# Patient Record
Sex: Female | Born: 1970
Health system: Southern US, Community
[De-identification: ages and names within clinical notes are randomized; demographics above are authoritative.]

## PROBLEM LIST (undated history)

## (undated) DIAGNOSIS — E119 Type 2 diabetes mellitus without complications: Secondary | ICD-10-CM

## (undated) DIAGNOSIS — E785 Hyperlipidemia, unspecified: Secondary | ICD-10-CM

## (undated) DIAGNOSIS — K219 Gastro-esophageal reflux disease without esophagitis: Secondary | ICD-10-CM

## (undated) DIAGNOSIS — Z8619 Personal history of other infectious and parasitic diseases: Secondary | ICD-10-CM

## (undated) DIAGNOSIS — IMO0001 Reserved for inherently not codable concepts without codable children: Secondary | ICD-10-CM

## (undated) DIAGNOSIS — R609 Edema, unspecified: Secondary | ICD-10-CM

## (undated) DIAGNOSIS — B009 Herpesviral infection, unspecified: Secondary | ICD-10-CM

## (undated) DIAGNOSIS — R011 Cardiac murmur, unspecified: Secondary | ICD-10-CM

## (undated) DIAGNOSIS — I10 Essential (primary) hypertension: Secondary | ICD-10-CM

## (undated) DIAGNOSIS — F419 Anxiety disorder, unspecified: Secondary | ICD-10-CM

## (undated) DIAGNOSIS — R0602 Shortness of breath: Secondary | ICD-10-CM

## (undated) DIAGNOSIS — M5430 Sciatica, unspecified side: Secondary | ICD-10-CM

## (undated) DIAGNOSIS — N83209 Unspecified ovarian cyst, unspecified side: Secondary | ICD-10-CM

## (undated) HISTORY — DX: Gastro-esophageal reflux disease without esophagitis: K21.9

## (undated) HISTORY — DX: Herpesviral infection, unspecified: B00.9

## (undated) HISTORY — DX: Anxiety disorder, unspecified: F41.9

## (undated) HISTORY — PX: DILATION AND CURETTAGE OF UTERUS: SHX78

## (undated) HISTORY — DX: Unspecified ovarian cyst, unspecified side: N83.209

## (undated) HISTORY — DX: Personal history of other infectious and parasitic diseases: Z86.19

## (undated) HISTORY — DX: Shortness of breath: R06.02

## (undated) HISTORY — DX: Sciatica, unspecified side: M54.30

## (undated) HISTORY — PX: ENDOMETRIAL ABLATION: SHX621

## (undated) HISTORY — DX: Reserved for inherently not codable concepts without codable children: IMO0001

---

## 2002-03-09 ENCOUNTER — Ambulatory Visit (HOSPITAL_COMMUNITY): Admission: RE | Admit: 2002-03-09 | Discharge: 2002-03-09 | Payer: Self-pay | Admitting: Internal Medicine

## 2004-05-28 ENCOUNTER — Emergency Department (HOSPITAL_COMMUNITY): Admission: EM | Admit: 2004-05-28 | Discharge: 2004-05-28 | Payer: Self-pay | Admitting: Emergency Medicine

## 2004-08-16 HISTORY — PX: TUBAL LIGATION: SHX77

## 2005-06-10 ENCOUNTER — Encounter: Payer: Self-pay | Admitting: Obstetrics and Gynecology

## 2005-06-10 ENCOUNTER — Inpatient Hospital Stay (HOSPITAL_COMMUNITY): Admission: RE | Admit: 2005-06-10 | Discharge: 2005-06-12 | Payer: Self-pay | Admitting: Obstetrics and Gynecology

## 2005-09-25 ENCOUNTER — Emergency Department (HOSPITAL_COMMUNITY): Admission: EM | Admit: 2005-09-25 | Discharge: 2005-09-25 | Payer: Self-pay | Admitting: Emergency Medicine

## 2007-05-12 ENCOUNTER — Other Ambulatory Visit: Admission: RE | Admit: 2007-05-12 | Discharge: 2007-05-12 | Payer: Self-pay | Admitting: Obstetrics and Gynecology

## 2008-01-25 ENCOUNTER — Ambulatory Visit (HOSPITAL_COMMUNITY): Admission: RE | Admit: 2008-01-25 | Discharge: 2008-01-25 | Payer: Self-pay | Admitting: Internal Medicine

## 2008-09-26 ENCOUNTER — Other Ambulatory Visit: Admission: RE | Admit: 2008-09-26 | Discharge: 2008-09-26 | Payer: Self-pay | Admitting: Obstetrics and Gynecology

## 2008-10-03 ENCOUNTER — Ambulatory Visit (HOSPITAL_COMMUNITY): Admission: RE | Admit: 2008-10-03 | Discharge: 2008-10-03 | Payer: Self-pay | Admitting: Obstetrics & Gynecology

## 2009-12-11 ENCOUNTER — Emergency Department (HOSPITAL_COMMUNITY): Admission: EM | Admit: 2009-12-11 | Discharge: 2009-12-12 | Payer: Self-pay | Admitting: Emergency Medicine

## 2009-12-16 ENCOUNTER — Observation Stay (HOSPITAL_COMMUNITY)
Admission: EM | Admit: 2009-12-16 | Discharge: 2009-12-17 | Payer: Self-pay | Source: Home / Self Care | Admitting: Emergency Medicine

## 2010-08-04 ENCOUNTER — Ambulatory Visit (HOSPITAL_COMMUNITY)
Admission: RE | Admit: 2010-08-04 | Discharge: 2010-08-04 | Payer: Self-pay | Source: Home / Self Care | Attending: Internal Medicine | Admitting: Internal Medicine

## 2010-08-27 ENCOUNTER — Encounter (HOSPITAL_COMMUNITY)
Admission: RE | Admit: 2010-08-27 | Discharge: 2010-09-15 | Payer: Self-pay | Source: Home / Self Care | Attending: Internal Medicine | Admitting: Internal Medicine

## 2010-09-21 ENCOUNTER — Institutional Professional Consult (permissible substitution) (INDEPENDENT_AMBULATORY_CARE_PROVIDER_SITE_OTHER): Payer: BC Managed Care – PPO | Admitting: Internal Medicine

## 2010-09-21 DIAGNOSIS — K279 Peptic ulcer, site unspecified, unspecified as acute or chronic, without hemorrhage or perforation: Secondary | ICD-10-CM

## 2010-09-21 DIAGNOSIS — A048 Other specified bacterial intestinal infections: Secondary | ICD-10-CM

## 2010-10-25 NOTE — Consult Note (Signed)
Susan [name], Susan [name]                ACCOUNT NO.:  [id number]  MEDICAL RECORD NO.:  [id number]           PATIENT TYPE: AMB  LOCATION: [place]                    [facility]  PHYSICIAN:  Dorene Ar, NP       DATE OF BIRTH:  Jan 04, 1971  DATE OF CONSULTATION: DATE OF DISCHARGE:                                CONSULTATION   REASON FOR CONSULTATION:  Epigastric pain.  HISTORY OF PRESENT ILLNESS:  Ms. Gerhart is a 40 year old female referred to our office by Dr. Sherwood Gambler for epigastric pain.  She was seen originally by Dr. Sherwood Gambler in December for epigastric pain.  She underwent a HIDA scan, which revealed a normal exam.  The gallbladder ejection fraction was 91%.  She also underwent an ultrasound of the abdomen for right upper quadrant pain also with a negative abdominal ultrasound.  She was, however, H. pylori positive in December.  She did receive an antibiotic treatment.  She states she did not have any epigastric pain in January. Her symptoms have returned for approximately 2 weeks now.  She does state that every time she eats, her stomach will knot up.  She complains of daily acid reflux.  She does have frequent nausea and frequent belching.  She states her appetite is good.  She has had no weight loss. She is having a bowel movement a day.  They are dark brown in color, normal size.  She is taking Motrin 200 mg 4 a day and Tylenol for her lower back pain.  August 31, 2010, hemoglobin 12.6 and hematocrit 38.7, ESR 8, platelets 253,000.  Sodium 137, potassium 4.6, chloride 101, CO2 28, BUN 14, creatinine 0.96, random glucose 88, calcium 9.4.  Her WBC count was 9.4.  ALLERGIES:  There are no known allergies.  HOME MEDICATIONS: 1. Celexa 20 mg 1 a day. 2. Xanax 0.25 p.r.n. 3. Valacyclovir 1 gram a day. 4. Tylenol as needed. 5. Motrin up to 4 a day, 200 mg.  SURGERIES:  She has had 2 C-sections in the past.  PREVIOUS MEDICAL HISTORY:  Genital herpes, anxiety, and back pain  from a work injury.  FAMILY HISTORY:  Her mother is alive with hypertension.  Her father is alive with diabetes, hypertension, and high cholesterol.  She has 2 brothers, 1 brother has hypertension, high cholesterol, and one is in good health.  She is married.  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  She does not smoke or do drugs.  She occasionally drinks alcohol and she has 2 children in good health.  OBJECTIVE:  VITALS:  Her weight is 223.4, her height is 4 feet 11 inches, her BMI is 45, her temperature is 99.1, her blood pressure is 116/80, her pulse is 72. HEENT:  She has natural teeth.  Her oral mucosa is moist.  There are no lesions.  Her teeth are in good condition.  Her conjunctivae is pink. Her sclerae is anicteric.  Her thyroid is normal.  There is no cervical lymphadenopathy. LUNGS:  Clear. HEART:  Regular rate and rhythm. ABDOMEN:  Obese, soft.  Bowel sounds are positive.  She does have some epigastric tenderness. EXTREMITIES:  There is no edema to her lower extremities.  ASSESSMENT:  Ms. Wienke is a 40 year old female with epigastric pain. Peptic ulcer disease is likely.  She has been taking an excessive amount of Motrin up to 4 a day.  She did test positive for H. pylori back in December.  She did receive treatment.  At present she is not on a PPI.  RECOMMENDATIONS:  We will start omeprazole 40 mg 1 a day, 30 minutes before breakfast.  She is to take no more than 2 Motrin a day.  She will call with a progress report in 2 weeks.  If she is not better in 2 weeks, recommend an EGD with Dr. Karilyn Cota          ______________________________ Dorene Ar, NP     TS/MEDQ  D:  09/21/2010  T:  09/22/2010  Job:  [id number]  cc:   Madelin Rear. Sherwood Gambler, MD Fax: [phone]  Electronically Signed by Dorene Ar PA on 09/22/2010 08:55:23 AM Electronically Signed by Lionel December M.D. on 10/25/2010 12:59:26 PM

## 2010-11-03 LAB — CBC
HCT: 35 % — ABNORMAL LOW (ref 36.0–46.0)
HCT: 38.2 % (ref 36.0–46.0)
Hemoglobin: 12 g/dL (ref 12.0–15.0)
Hemoglobin: 13.1 g/dL (ref 12.0–15.0)
MCHC: 34.2 g/dL (ref 30.0–36.0)
MCHC: 34.3 g/dL (ref 30.0–36.0)
MCV: 88.1 fL (ref 78.0–100.0)
MCV: 88.6 fL (ref 78.0–100.0)
Platelets: 182 10*3/uL (ref 150–400)
Platelets: 204 10*3/uL (ref 150–400)
RBC: 3.98 MIL/uL (ref 3.87–5.11)
RBC: 4.32 MIL/uL (ref 3.87–5.11)
RDW: 15.6 % — ABNORMAL HIGH (ref 11.5–15.5)
RDW: 15.6 % — ABNORMAL HIGH (ref 11.5–15.5)
WBC: 8.2 10*3/uL (ref 4.0–10.5)
WBC: 8.2 10*3/uL (ref 4.0–10.5)

## 2010-11-03 LAB — POCT I-STAT, CHEM 8
BUN: 11 mg/dL (ref 6–23)
Calcium, Ion: 1.09 mmol/L — ABNORMAL LOW (ref 1.12–1.32)
Chloride: 104 mEq/L (ref 96–112)
Creatinine, Ser: 1.1 mg/dL (ref 0.4–1.2)
Glucose, Bld: 119 mg/dL — ABNORMAL HIGH (ref 70–99)
HCT: 41 % (ref 36.0–46.0)
Hemoglobin: 13.9 g/dL (ref 12.0–15.0)
Potassium: 3.4 mEq/L — ABNORMAL LOW (ref 3.5–5.1)
Sodium: 138 mEq/L (ref 135–145)
TCO2: 24 mmol/L (ref 0–100)

## 2010-11-03 LAB — POCT CARDIAC MARKERS
CKMB, poc: <1 ng/mL — ABNORMAL LOW (ref 1.0–8.0)
CKMB, poc: <1 ng/mL — ABNORMAL LOW (ref 1.0–8.0)
CKMB, poc: <1 ng/mL — ABNORMAL LOW (ref 1.0–8.0)
Myoglobin, poc: 44 ng/mL (ref 12–200)
Myoglobin, poc: 69.4 ng/mL (ref 12–200)
Myoglobin, poc: 52.9 ng/mL (ref 12–200)
Troponin i, poc: <0.05 ng/mL (ref 0.00–0.09)
Troponin i, poc: <0.05 ng/mL (ref 0.00–0.09)
Troponin i, poc: <0.05 ng/mL (ref 0.00–0.09)

## 2010-11-03 LAB — BASIC METABOLIC PANEL
BUN: 10 mg/dL (ref 6–23)
BUN: 12 mg/dL (ref 6–23)
CO2: 23 mEq/L (ref 19–32)
CO2: 25 mEq/L (ref 19–32)
Calcium: 8.7 mg/dL (ref 8.4–10.5)
Calcium: 8.8 mg/dL (ref 8.4–10.5)
Chloride: 104 mEq/L (ref 96–112)
Chloride: 106 mEq/L (ref 96–112)
Creatinine, Ser: 0.82 mg/dL (ref 0.4–1.2)
Creatinine, Ser: 0.91 mg/dL (ref 0.4–1.2)
GFR calc Af Amer: >60 mL/min (ref >60)
GFR calc Af Amer: >60 mL/min (ref >60)
GFR calc non Af Amer: >60 mL/min (ref >60)
GFR calc non Af Amer: >60 mL/min (ref >60)
Glucose, Bld: 117 mg/dL — ABNORMAL HIGH (ref 70–99)
Glucose, Bld: 99 mg/dL (ref 70–99)
Potassium: 3.5 mEq/L (ref 3.5–5.1)
Potassium: 3.9 mEq/L (ref 3.5–5.1)
Sodium: 136 mEq/L (ref 135–145)
Sodium: 136 mEq/L (ref 135–145)

## 2010-11-03 LAB — CK TOTAL AND CKMB (NOT AT ARMC)
CK, MB: 0.8 ng/mL (ref 0.3–4.0)
CK, MB: 1 ng/mL (ref 0.3–4.0)
Relative Index: 0.7 (ref 0.0–2.5)
Relative Index: 0.9 (ref 0.0–2.5)
Total CK: 112 U/L (ref 7–177)
Total CK: 115 U/L (ref 7–177)

## 2010-11-03 LAB — DIFFERENTIAL
Basophils Absolute: 0 10*3/uL (ref 0.0–0.1)
Basophils Absolute: 0.1 10*3/uL (ref 0.0–0.1)
Basophils Relative: 0 % (ref 0–1)
Basophils Relative: 1 % (ref 0–1)
Eosinophils Absolute: 0.3 10*3/uL (ref 0.0–0.7)
Eosinophils Absolute: 0.3 10*3/uL (ref 0.0–0.7)
Eosinophils Relative: 3 % (ref 0–5)
Eosinophils Relative: 4 % (ref 0–5)
Lymphocytes Relative: 36 % (ref 12–46)
Lymphocytes Relative: 46 % (ref 12–46)
Lymphs Abs: 3 10*3/uL (ref 0.7–4.0)
Lymphs Abs: 3.8 10*3/uL (ref 0.7–4.0)
Monocytes Absolute: 0.4 10*3/uL (ref 0.1–1.0)
Monocytes Absolute: 0.6 10*3/uL (ref 0.1–1.0)
Monocytes Relative: 5 % (ref 3–12)
Monocytes Relative: 8 % (ref 3–12)
Neutro Abs: 3.5 10*3/uL (ref 1.7–7.7)
Neutro Abs: 4.5 10*3/uL (ref 1.7–7.7)
Neutrophils Relative %: 42 % — ABNORMAL LOW (ref 43–77)
Neutrophils Relative %: 55 % (ref 43–77)

## 2010-11-03 LAB — D-DIMER, QUANTITATIVE: D-Dimer, Quant: 0.28 ug/mL-FEU (ref 0.00–0.48)

## 2010-11-03 LAB — TROPONIN I
Troponin I: <0.01 ng/mL (ref 0.00–0.06)
Troponin I: <0.01 ng/mL (ref 0.00–0.06)

## 2010-11-03 LAB — PROTIME-INR
INR: 0.91 (ref 0.00–1.49)
Prothrombin Time: 12.5 seconds (ref 11.6–15.2)

## 2010-11-03 LAB — APTT: aPTT: 25 seconds (ref 24–37)

## 2010-12-09 ENCOUNTER — Other Ambulatory Visit: Payer: Self-pay | Admitting: Adult Health

## 2010-12-09 ENCOUNTER — Other Ambulatory Visit: Payer: Self-pay | Admitting: Obstetrics & Gynecology

## 2010-12-09 ENCOUNTER — Other Ambulatory Visit (HOSPITAL_COMMUNITY)
Admission: RE | Admit: 2010-12-09 | Discharge: 2010-12-09 | Disposition: A | Discharge status: Home / Self Care | Payer: BC Managed Care – PPO | Source: Ambulatory Visit | Attending: Obstetrics and Gynecology | Admitting: Obstetrics and Gynecology

## 2010-12-09 DIAGNOSIS — N920 Excessive and frequent menstruation with regular cycle: Secondary | ICD-10-CM

## 2010-12-09 DIAGNOSIS — N63 Unspecified lump in unspecified breast: Secondary | ICD-10-CM

## 2010-12-09 DIAGNOSIS — N946 Dysmenorrhea, unspecified: Secondary | ICD-10-CM

## 2010-12-09 DIAGNOSIS — Z01419 Encounter for gynecological examination (general) (routine) without abnormal findings: Secondary | ICD-10-CM | POA: Insufficient documentation

## 2010-12-14 ENCOUNTER — Ambulatory Visit (HOSPITAL_COMMUNITY)
Admission: RE | Admit: 2010-12-14 | Discharge: 2010-12-14 | Disposition: A | Discharge status: Home / Self Care | Payer: BC Managed Care – PPO | Source: Ambulatory Visit | Attending: Obstetrics & Gynecology | Admitting: Obstetrics & Gynecology

## 2010-12-14 ENCOUNTER — Other Ambulatory Visit: Payer: Self-pay | Admitting: Obstetrics & Gynecology

## 2010-12-14 DIAGNOSIS — N920 Excessive and frequent menstruation with regular cycle: Secondary | ICD-10-CM

## 2010-12-14 DIAGNOSIS — R9389 Abnormal findings on diagnostic imaging of other specified body structures: Secondary | ICD-10-CM | POA: Insufficient documentation

## 2010-12-14 DIAGNOSIS — N946 Dysmenorrhea, unspecified: Secondary | ICD-10-CM | POA: Insufficient documentation

## 2010-12-14 DIAGNOSIS — N949 Unspecified condition associated with female genital organs and menstrual cycle: Secondary | ICD-10-CM | POA: Insufficient documentation

## 2010-12-23 ENCOUNTER — Ambulatory Visit (HOSPITAL_COMMUNITY)
Admission: RE | Admit: 2010-12-23 | Discharge: 2010-12-23 | Disposition: A | Discharge status: Home / Self Care | Payer: BC Managed Care – PPO | Source: Ambulatory Visit | Attending: Obstetrics & Gynecology | Admitting: Obstetrics & Gynecology

## 2010-12-23 DIAGNOSIS — N63 Unspecified lump in unspecified breast: Secondary | ICD-10-CM | POA: Insufficient documentation

## 2010-12-28 ENCOUNTER — Encounter: Payer: Self-pay | Admitting: Adult Health

## 2011-01-01 NOTE — Discharge Summary (Signed)
Susan [name], Susan [name]                ACCOUNT NO.:  [id number]   MEDICAL RECORD NO.:  [id number]          PATIENT TYPE:  INP   LOCATION:  A413                          FACILITY:  [facility]   PHYSICIAN:  Tilda Burrow, M.D. DATE OF BIRTH:  1971/01/23   DATE OF ADMISSION:  06/10/2005  DATE OF DISCHARGE:  10/28/2006LH                                 DISCHARGE SUMMARY   ADMISSION DIAGNOSES:  1.  Pregnancy at 38-4/7 weeks.  2.  Repeat cesarean section, not for trial of labor.  3.  Desire for elective permanent sterilization.   DISCHARGE DIAGNOSES:  1.  Pregnancy at 38-4/7 weeks.  2.  Repeat cesarean section, not for trial of labor.  3.  Desire for elective permanent sterilization.   PROCEDURE:  Repeat low transverse cervical cesarean section on June 10, 2005, by Dr. Emelda Fear.   DISCHARGE MEDICATIONS:  1.  Motrin 800 mg one p.o. q.8h. p.r.n. pain.  2.  Tylox one to two q.4h. p.r.n. pain, dispense #20.  3.  Prenatal vitamins one p.o. daily.  4.  Tums and Rolaids p.r.n.   HOSPITAL COURSE:  This 40 year old female, G3, P1, AB1 due November 4, with  first and second trimester ultrasounds admitted for repeat cesarean section  as described in the H&P.   The patient had an admitting hemoglobin of 11.8, hematocrit 35.7, white  count 10,400 with blood type B positive.  She underwent repeat cesarean  section delivering healthy female infant with Apgar's 9 and 9.  She had a wide  excision of the cicatrix performed at the time of the C-section ([place]).  She  had EBL of 600 mL.  Tubal ligation was performed  at the time of C-section.  Pathology report confirms portion of both tubes  removed.  She tolerated a regular diet within 2 days.  She was discharged  home with routine followup instructions for staple removal in 1 week and  then routine postpartum visit in 4 weeks.      Tilda Burrow, M.D.  Electronically Signed     JVF/MEDQ  D:  06/28/2005  T:  06/29/2005  Job:  [id number]   cc:    Francoise Schaumann. Halford Chessman  Fax: [phone]   [facility]

## 2011-01-01 NOTE — Op Note (Signed)
[name], [name]                ACCOUNT NO.:  [id number]   MEDICAL RECORD NO.:  [id number]          PATIENT TYPE:  INP   LOCATION:  A413                          FACILITY:  [facility]   PHYSICIAN:  Tilda Burrow, M.D. DATE OF BIRTH:  Feb 06, 1971   DATE OF PROCEDURE:  06/10/2005  DATE OF DISCHARGE:                                 OPERATIVE REPORT   PREOPERATIVE DIAGNOSIS:  Pregnancy at 38 weeks 4 days, repeat Cesarean  section, not for trial of labor, desires elective permanent sterilization.   POSTOPERATIVE DIAGNOSIS:  Pregnancy at 38 weeks 4 days, repeat Cesarean  section, not for trial of labor, desires elective permanent sterilization.   PROCEDURE:  Repeat low transverse cervical Cesarean section. Bilateral  partial salpingectomy. Wide excision of cicatrix ([place]).   SURGEON:  Tilda Burrow, M.D.   ASSISTANTAlinda Money, R.N.   ANESTHESIA:  Spinal, Nelda Severe, C.R.N.A.   COMPLICATIONS:  None.   FINDINGS:  Healthy female infant, Apgars 9 and 9. Abdominal wall laxity with  skin crease from prior Cesarean section.   INDICATIONS:  A 40 year old female, gravida 3, para 1, AB 1 scheduled for  repeat Cesarean section. Specific discussion of the skin incision revision  was performed preprocedure to make sure patient was aware of the enlarged  incision resulting increased risk for bleeding, infection, etc.   DETAILS OF PROCEDURE:  The patient was taken to the operating room. Spinal  anesthesia introduced and the abdomen prepped and draped. Foley catheter was  then placed. Transverse lower abdominal incision was marked approximately  two inches above the old scar and approximately 1 inch below it, with  scoring of the skin along the future incision lines. We then entered the  upper incision, opening transversely from the anterior superior iliac  crest/anterior superior iliac crest, and developing the fascia in the  standard method of Pfannenstiel incision. The rectus muscles were split in  the midline, peritoneal cavity entered easily and without difficulty, and  peritoneal opening extended superiorly and inferiorly. Bladder flap was  developed easily on the lower uterine segment and fetal vertex identified  using a transverse nick in the lower uterine segment. This was opened  transversely using index finger traction, and then the fetal vertex guided  through the incision using vacuum extractor followed by placement of index  finger under the axilla and delivery of the fetal body. The amniotic fluid  was clear without malodor. Infant was delivered by axillary traction, and  subsequent axillary traction and subsequent clamping of the cord was  performed. See Dr. Webb Laws notes regarding baby's care.   Cord blood samples were obtained, and then the placenta delivered by Crede  uterine massage. The majority of the cord was passed off to Dr. Milford Cage with  the baby, but enough was present for the blood samples to be adequately  obtained. Placenta was intact, [name] presentation. Uterine irrigation with  antibiotic solution was followed by single layer of running locking closure  with 0 chromic. The bladder flap was inspected and found hemostatic.   Tubal ligation was then performed by grasping each mid segment  knuckle of  tube with Babcock clamp, doubly ligating around the incarcerated portion of  the tube and excising the middle portion of the tube. Hemostasis was  visually confirmed.   Bladder flap was then reapproximated using 2-0 chromic, and then the  peritoneal cavity closed with 2-0 chromic running closure of the peritoneum,  2-0 chromic reapproximation of the rectus muscles into the midline x2  interrupted sutures, then 0 Vicryl closure of the fascia. Prior to the  closure of the fascia, we reinspected the incision. It became apparent that  some additional trimming of the skin was necessary, so an additional strip  of skin and underlying fatty tissue was removed from the  cephalad portion of  the incision. We then reapproximated the fascia. A small bit of fascia had  been excised with the incision revision. The fascia was pulled together with  continuous running 0 Vicryl with excellent tissue approximation.  Subcutaneous tissue were reapproximated using interrupted 2-0 plain, subcu  flat JP drain was placed through the subcu tissues and allowed to exit  through the incision on the right side. The skin edges were reapproximated  nicely using the subcu stitches of 2-0 plain, and then staple closure of the  skin completed the procedure. A JP drain was sewn in place. Estimated blood  loss 600 cc.      Tilda Burrow, M.D.  Electronically Signed     JVF/MEDQ  D:  06/10/2005  T:  06/11/2005  Job:  [id number]   cc:   Francoise Schaumann. Halford Chessman  Fax: [phone]

## 2011-01-01 NOTE — H&P (Signed)
NAMEMARIABELLA, NILSEN                ACCOUNT NO.:  [id number]   MEDICAL RECORD NO.:  [id number]           PATIENT TYPE:   LOCATION:                                FACILITY:  [facility]   PHYSICIAN:  Tilda Burrow, M.D. DATE OF BIRTH:  July 09, 1971   DATE OF ADMISSION:  DATE OF DISCHARGE:  LH                                HISTORY & PHYSICAL   ADMISSION DIAGNOSIS:  Pregnancy, 38 weeks 4 days. Repeat Cesarean section,  not for trial of labor. Desire for elective permanent sterilization.   HISTORY OF PRESENT ILLNESS:  This 40 year old female gravida 3, para 1, AB  1, LMP September 12, 2004 placing menstrual [facility] June 19, 2005 with  corresponding first trimester and second trimester ultrasounds __with  prenatal care__ followed through our office through 12 prenatal visits.  Romilda has been scheduled for repeat Cesarean section and tubal ligation.  She understands the permanency of the request and tubal sterilization with  failure rates of 1:100 quoted to the patient. Prenatal course has been  uneventful with fundal height running slightly below normal but considered  appropriate for patient's small stature. Prior OB history of a low  transverse cervical Cesarean section for variable decelerations, unable to  deliver a 7-pound 15-ounce infant.   PHYSICAL EXAMINATION:  GENERAL:  Shows a short, large framed, African-  American female. Height 5 feet 0, weight 226 which is a 15-pound weight gain  this pregnancy.  HEENT:  Pupils are equal, round, and reactive. Extraocular movements intact.  NECK:  Supple. Trachea midline.  CHEST:  Clear to auscultation.  ABDOMEN:  34-cm fundal height. Estimated fetal weight 6 pounds. Cervix  closed, long, posterior, vertex well applied to the cervix.   PRENATAL LABORATORY DATA:  Blood type B+, rubella immunity present, urine  drug screen negative. Hemoglobin 13, hematocrit 38. Hepatitis, HIV, GC and  chlamydia all negative. HSV-2 noted on antibody testing. MSAFP  is normal.  Hemoglobin 11, hematocrit 33. Glucose tolerance test borderline at one hour  with normal three-hour test. There was no glucosuria noted during routine  prenatal visit.   The patient plans to bottle feed. Will take the baby to Dr. Milford Cage. Plan  circumcision as well as tubal ligation.   PLAN:  Repeat Cesarean section, tubal ligation, [facility] 8:30 a.m. June 10, 2005.      Tilda Burrow, M.D.  Electronically Signed     JVF/MEDQ  D:  05/28/2005  T:  05/28/2005  Job:  [id number]   cc:   Francoise Schaumann. Halford Chessman  Fax: [phone]

## 2011-01-01 NOTE — H&P (Signed)
Susan [name], Susan [name]                ACCOUNT NO.:  [id number]   MEDICAL RECORD NO.:  [id number]          PATIENT TYPE:  AMB   LOCATION:  DAY                           FACILITY:  [facility]   PHYSICIAN:  Tilda Burrow, M.D. DATE OF BIRTH:  10-29-1970   DATE OF ADMISSION:  DATE OF DISCHARGE:  LH                                HISTORY & PHYSICAL   ADMISSION DIAGNOSES:  1.  Pregnancy, 38 + [redacted] weeks gestation.  2.  Repeat cesarean section, not for trial of labor.  3.  Desires elective permanent sterilization.   HISTORY OF PRESENT ILLNESS:  This 40 year old female, gravida 3, para 1, AB  1, last menstrual period September 12, 2004, placing menstrual [facility] June 19, 2005 with corresponding first and second trimester ultrasound.  Is admitted  after pregnancy course followed through our office through 12 prenatal  visits with appropriate weight gain and fundal height growth.  She has been  seen in our office and scheduled for cesarean section.  Tubal ligation has  been discussed, reviewed and specifically requested by the patient.  Technical failure, rate of 1%, has been quoted to the patient.  Prenatal  course has been notable for blood type B positive.  Urine drug screen  negative, hemoglobin 13, hematocrit 38, hepatitis, HIV, GC and Chlamydia all  negative.  __msafp__ normal at 1/560.  Glucose tolerance test abnormal at  one hour at 175 mg/percent with normal three-hour test.  She experienced no  glucosuria throughout the pregnancy.  Fundal heights were appropriate.  No  measurements were excessive.  She plans to bottle feed.  Baby will be cared  for by Dr. Milford Cage of [facility].   PAST MEDICAL HISTORY:  Benign.   PAST SURGICAL HISTORY:  1.  Dilatation and curettage.  2.  Cesarean section performed in 1997 for uncertain fetal status,      delivering a 7 pound 15 ounce female, low transverse incision      documented.   ALLERGIES:  None.   SOCIAL HISTORY:  Married.  Works at  [employer] as a Lawyer.   PHYSICAL EXAMINATION:  GENERAL:  Healthy-appearing female, alert and  oriented x3.  VITAL SIGNS:  Height 5 feet, weight 227, blood pressure 110/80.  _Abdominal__ exam term-size fetus.  Vertex  presentation.  Estimated fetal weight 7 to 7-1/2 pounds.  CERVIX:  Closed, long and high at last office check.   PLAN:  Repeat cesarean section, tubal ligation June 10, 2005.      Tilda Burrow, M.D.  Electronically Signed     JVF/MEDQ  D:  06/09/2005  T:  06/09/2005  Job:  [id number]   cc:   Francoise Schaumann. Halford Chessman  Fax: [phone]   [facility]  Fax: [phone]

## 2011-02-03 ENCOUNTER — Encounter (HOSPITAL_COMMUNITY)
Admission: RE | Admit: 2011-02-03 | Discharge: 2011-02-03 | Disposition: A | Discharge status: Home / Self Care | Payer: BC Managed Care – PPO | Source: Ambulatory Visit | Attending: Obstetrics & Gynecology | Admitting: Obstetrics & Gynecology

## 2011-02-03 LAB — SURGICAL PCR SCREEN
MRSA, PCR: NEGATIVE
Staphylococcus aureus: NEGATIVE

## 2011-02-03 LAB — COMPREHENSIVE METABOLIC PANEL
BUN: 12 mg/dL (ref 6–23)
CO2: 28 mEq/L (ref 19–32)
Calcium: 9.3 mg/dL (ref 8.4–10.5)
Creatinine, Ser: 0.88 mg/dL (ref 0.50–1.10)
GFR calc Af Amer: >60 mL/min (ref >60)
GFR calc non Af Amer: >60 mL/min (ref >60)
Glucose, Bld: 100 mg/dL — ABNORMAL HIGH (ref 70–99)
Total Bilirubin: 0.2 mg/dL — ABNORMAL LOW (ref 0.3–1.2)

## 2011-02-03 LAB — CBC
HCT: 37.7 % (ref 36.0–46.0)
Hemoglobin: 12.5 g/dL (ref 12.0–15.0)
MCH: 28.5 pg (ref 26.0–34.0)
MCV: 85.9 fL (ref 78.0–100.0)
RBC: 4.39 MIL/uL (ref 3.87–5.11)

## 2011-02-10 ENCOUNTER — Ambulatory Visit (HOSPITAL_COMMUNITY)
Admission: RE | Admit: 2011-02-10 | Discharge: 2011-02-10 | Disposition: A | Discharge status: Home / Self Care | Payer: BC Managed Care – PPO | Source: Ambulatory Visit | Attending: Obstetrics & Gynecology | Admitting: Obstetrics & Gynecology

## 2011-02-10 DIAGNOSIS — N920 Excessive and frequent menstruation with regular cycle: Secondary | ICD-10-CM | POA: Insufficient documentation

## 2011-02-10 DIAGNOSIS — N946 Dysmenorrhea, unspecified: Secondary | ICD-10-CM | POA: Insufficient documentation

## 2011-02-10 DIAGNOSIS — Z01812 Encounter for preprocedural laboratory examination: Secondary | ICD-10-CM | POA: Insufficient documentation

## 2011-02-16 NOTE — Op Note (Signed)
  Susan [name], Susan [name]                ACCOUNT NO.:  [id number]  MEDICAL RECORD NO.:  [id number]  LOCATION:  [facility]                          FACILITY:  [facility]  PHYSICIAN:  Lazaro Arms, M.D.   DATE OF BIRTH:  23-Apr-1971  DATE OF PROCEDURE:  02/10/2011 DATE OF DISCHARGE:                              OPERATIVE REPORT   PREOPERATIVE DIAGNOSES: 1. Menometrorrhagia. 2. Dysmenorrhea.  POSTOPERATIVE DIAGNOSES: 1. Menometrorrhagia. 2. Dysmenorrhea.  PROCEDURE:  Hysteroscopy, D and C, endometrial ablation.  SURGEON:  Lazaro Arms, MD  ANESTHESIA:  General endotracheal.  FINDINGS:  The patient had a normal ultrasound in the office, normal endometrium and today hysteroscopy.  She had no polyps, fibroids, or other endometrial abnormalities.  DESCRIPTION OF OPERATION:  The patient was taken to the operating room, placed in the supine position where she underwent general endotracheal anesthesia, placed in dorsal lithotomy position, prepped and draped in usual sterile fashion.  A Graves speculum was placed.  Cervix was grasped.  With single-tooth tenaculum, the cervix was dilated serially to allow passage of the hysteroscope.  Diagnostic hysteroscopy was performed and uterine curettage was performed with good uterine cry in all areas.  ThermaChoice III endometrial ablation balloon was used.  22 mL of D5W was required to maintain a pressure between 190 and 200 mmHg throughout the procedure.  It was heated to 87 degrees Celsius.  Total therapy time was 10 minutes and 41 seconds.  All of the fluids returned at the end of the procedure and all of the equipment worked properly throughout the procedure.  The patient was awakened from anesthesia, taken to recovery room in good and stable condition.  All counts correct.  She received a gram of Ancef and Toradol preoperatively prophylactically.     Lazaro Arms, M.D.     Loraine Maple  D:  02/10/2011  T:  02/11/2011  Job:   [id number]  Electronically Signed by Duane Lope M.D. on 02/16/2011 10:30:19 AM

## 2011-04-21 ENCOUNTER — Other Ambulatory Visit (INDEPENDENT_AMBULATORY_CARE_PROVIDER_SITE_OTHER): Payer: Self-pay | Admitting: Internal Medicine

## 2011-07-05 ENCOUNTER — Other Ambulatory Visit (INDEPENDENT_AMBULATORY_CARE_PROVIDER_SITE_OTHER): Payer: Self-pay | Admitting: Internal Medicine

## 2011-08-19 ENCOUNTER — Other Ambulatory Visit (INDEPENDENT_AMBULATORY_CARE_PROVIDER_SITE_OTHER): Payer: Self-pay | Admitting: Internal Medicine

## 2011-08-19 DIAGNOSIS — K219 Gastro-esophageal reflux disease without esophagitis: Secondary | ICD-10-CM

## 2012-04-14 ENCOUNTER — Other Ambulatory Visit (INDEPENDENT_AMBULATORY_CARE_PROVIDER_SITE_OTHER): Payer: Self-pay | Admitting: Internal Medicine

## 2012-05-18 ENCOUNTER — Other Ambulatory Visit: Payer: Self-pay | Admitting: Obstetrics & Gynecology

## 2012-05-18 ENCOUNTER — Other Ambulatory Visit (HOSPITAL_COMMUNITY)
Admission: RE | Admit: 2012-05-18 | Discharge: 2012-05-18 | Disposition: A | Discharge status: Home / Self Care | Payer: BC Managed Care – PPO | Source: Ambulatory Visit | Attending: Obstetrics & Gynecology | Admitting: Obstetrics & Gynecology

## 2012-05-18 DIAGNOSIS — Z01419 Encounter for gynecological examination (general) (routine) without abnormal findings: Secondary | ICD-10-CM | POA: Insufficient documentation

## 2012-09-21 ENCOUNTER — Ambulatory Visit (INDEPENDENT_AMBULATORY_CARE_PROVIDER_SITE_OTHER): Payer: BC Managed Care – PPO | Admitting: Internal Medicine

## 2012-09-21 ENCOUNTER — Encounter (INDEPENDENT_AMBULATORY_CARE_PROVIDER_SITE_OTHER): Payer: Self-pay | Admitting: Internal Medicine

## 2012-09-21 VITALS — BP 130/76 | HR 72 | Temp 99.2°F | Ht 59.0 in | Wt 249.4 lb

## 2012-09-21 DIAGNOSIS — A6000 Herpesviral infection of urogenital system, unspecified: Secondary | ICD-10-CM

## 2012-09-21 DIAGNOSIS — K219 Gastro-esophageal reflux disease without esophagitis: Secondary | ICD-10-CM

## 2012-09-21 DIAGNOSIS — R109 Unspecified abdominal pain: Secondary | ICD-10-CM | POA: Insufficient documentation

## 2012-09-21 DIAGNOSIS — R1906 Epigastric swelling, mass or lump: Secondary | ICD-10-CM

## 2012-09-21 DIAGNOSIS — R10A0 Flank pain, unspecified side: Secondary | ICD-10-CM

## 2012-09-21 NOTE — Patient Instructions (Addendum)
Cmet, US abdomen

## 2012-09-21 NOTE — Progress Notes (Signed)
Subjective:     Patient ID: Susan [name], female   DOB: [birth year], 42 y.o.   MRN: [id number]  HPIPresents today with c/o that she has a constant grumbling in her stomach x 1-2 months. She also tells me her stomach is hard. Occasionally has pressure in her left flank. She tells me her acid reflux is controlled with omeprazole. Appetite is good. No weight loss. No abdominal pain. She describes the left flank pain as pressure. The pressure is not constant. Usually occurs after eating. She has at least 2 BMs a day. No melena or bright red rectal bleeding. No change in caliber of her stools Hx of pylori with antibiotic treatment in 2011  1/12/2012Hida Scan:IMPRESSION:  Normal exam.  Normal values for gallbladder ejection fraction:  > 30% for exams utilizing sincalide (CCK)  > 50% for exams utilizing fatty meal stimulation  Provider: Lou Cal   07/2010 US abdomen: normal.     Review of Systems see hpi Current Outpatient Prescriptions  Medication Sig Dispense Refill  . citalopram (CELEXA) 20 MG tablet Take 20 mg by mouth daily.        Marland Kitchen desvenlafaxine (PRISTIQ) 100 MG 24 hr tablet Take 100 mg by mouth daily.      Marland Kitchen omeprazole (PRILOSEC) 40 MG capsule TAKE 1 CAPSULE BY MOUTH 30 MINUTES BEFORE BREAKFAST  30 capsule  5  . valACYclovir (VALTREX) 1000 MG tablet Take 1,000 mg by mouth 1 dose over 24 hours.         Past Medical History  Diagnosis Date  . Herpes simplex without mention of complication   . Anxiety    Past Surgical History  Procedure Date  . Tubal ligation 2006  . Dilation and curettage of uterus   . Endometrial ablation    No Known Allergies      Objective:   Physical Exam Filed Vitals:   09/21/12 1429  BP: 130/76  Pulse: 72  Temp: 99.2 F (37.3 C)  Height: 4\' 11"  (1.499 m)  Weight: 249 lb 6.4 oz (113.127 kg)    Alert and oriented. Skin warm and dry. Oral mucosa is moist.   . Sclera anicteric, conjunctivae is pink. Thyroid not enlarged. No cervical  lymphadenopathy. Lungs clear. Heart regular rate and rhythm.  Abdomen is soft and obese Bowel sounds are positive. No hepatomegaly. No abdominal masses felt. No tenderness.  No edema to lower extremities.       Assessment:   Epastric fullness and left flank pressure.  Gallbladder disease needs to be ruled out.     Plan:    cmet, US abdomen. Further recommendations to follow

## 2012-09-22 LAB — COMPREHENSIVE METABOLIC PANEL
ALT: 18 U/L (ref 0–35)
AST: 14 U/L (ref 0–37)
BUN: 11 mg/dL (ref 6–23)
CO2: 27 mEq/L (ref 19–32)
Creat: 0.79 mg/dL (ref 0.50–1.10)
Total Bilirubin: 0.2 mg/dL — ABNORMAL LOW (ref 0.3–1.2)

## 2012-09-28 ENCOUNTER — Ambulatory Visit (HOSPITAL_COMMUNITY): Payer: BC Managed Care – PPO

## 2012-10-03 ENCOUNTER — Ambulatory Visit (HOSPITAL_COMMUNITY)
Admission: RE | Admit: 2012-10-03 | Discharge: 2012-10-03 | Disposition: A | Discharge status: Home / Self Care | Payer: BC Managed Care – PPO | Source: Ambulatory Visit | Attending: Internal Medicine | Admitting: Internal Medicine

## 2012-10-03 DIAGNOSIS — R10A0 Flank pain, unspecified side: Secondary | ICD-10-CM

## 2012-10-03 DIAGNOSIS — R1906 Epigastric swelling, mass or lump: Secondary | ICD-10-CM

## 2012-10-03 DIAGNOSIS — R109 Unspecified abdominal pain: Secondary | ICD-10-CM

## 2012-10-03 DIAGNOSIS — R935 Abnormal findings on diagnostic imaging of other abdominal regions, including retroperitoneum: Secondary | ICD-10-CM | POA: Insufficient documentation

## 2012-10-03 DIAGNOSIS — R1032 Left lower quadrant pain: Secondary | ICD-10-CM | POA: Insufficient documentation

## 2012-10-05 ENCOUNTER — Telehealth (INDEPENDENT_AMBULATORY_CARE_PROVIDER_SITE_OTHER): Payer: Self-pay | Admitting: Internal Medicine

## 2012-10-05 NOTE — Telephone Encounter (Signed)
Results of [place] given to patient. She tells me she is not having any pain. She took a laxative and her pain resolved.  She may follow up on a prn basis.

## 2012-12-12 ENCOUNTER — Other Ambulatory Visit: Payer: Self-pay | Admitting: Adult Health

## 2012-12-12 ENCOUNTER — Other Ambulatory Visit (INDEPENDENT_AMBULATORY_CARE_PROVIDER_SITE_OTHER): Payer: Self-pay | Admitting: Internal Medicine

## 2013-12-18 ENCOUNTER — Ambulatory Visit (INDEPENDENT_AMBULATORY_CARE_PROVIDER_SITE_OTHER): Payer: BC Managed Care – PPO | Admitting: Adult Health

## 2013-12-18 ENCOUNTER — Other Ambulatory Visit (HOSPITAL_COMMUNITY)
Admission: RE | Admit: 2013-12-18 | Discharge: 2013-12-18 | Disposition: A | Discharge status: Home / Self Care | Payer: BC Managed Care – PPO | Source: Ambulatory Visit | Attending: Adult Health | Admitting: Adult Health

## 2013-12-18 ENCOUNTER — Encounter: Payer: Self-pay | Admitting: Adult Health

## 2013-12-18 VITALS — BP 126/82 | HR 78 | Ht 59.0 in | Wt 239.0 lb

## 2013-12-18 DIAGNOSIS — Z1212 Encounter for screening for malignant neoplasm of rectum: Secondary | ICD-10-CM

## 2013-12-18 DIAGNOSIS — Z8619 Personal history of other infectious and parasitic diseases: Secondary | ICD-10-CM

## 2013-12-18 DIAGNOSIS — Z1151 Encounter for screening for human papillomavirus (HPV): Secondary | ICD-10-CM | POA: Insufficient documentation

## 2013-12-18 DIAGNOSIS — Z01419 Encounter for gynecological examination (general) (routine) without abnormal findings: Secondary | ICD-10-CM | POA: Insufficient documentation

## 2013-12-18 HISTORY — DX: Personal history of other infectious and parasitic diseases: Z86.19

## 2013-12-18 LAB — HEMOCCULT GUIAC POC 1CARD (OFFICE): Fecal Occult Blood, POC: NEGATIVE

## 2013-12-18 MED ORDER — VALACYCLOVIR HCL 1 G PO TABS: ORAL_TABLET | ORAL | Status: DC

## 2013-12-18 NOTE — Patient Instructions (Signed)
Physical in 1 year Mammogram yearly Try valtrex bid

## 2013-12-18 NOTE — Progress Notes (Signed)
Patient ID: Susan [name], female   DOB: [birth year], 43 y.o.   MRN: [id number] History of Present Illness: Niza is a 43 year old black female, married in for pap and physical.Has had ablation but has been having  Regular period about a year now but not bad,Has had more frequent herpes outbreaks.   Current Medications, Allergies, Past Medical History, Past Surgical History, Family History and Social History were reviewed in [employer] record.     Review of Systems: Patient denies any headaches, blurred vision, shortness of breath, chest pain, abdominal pain, problems with bowel movements, urination, or intercourse. No joint pain or mood swings.See HPI for positives.    Physical Exam:BP 126/82  Pulse 78  Ht 4\' 11"  (1.499 m)  Wt 239 lb (108.41 kg)  BMI 48.25 kg/m2  LMP 12/12/2013 General:  Well developed, well nourished, no acute distress Skin:  Warm and dry Neck:  Midline trachea, normal thyroid Lungs; Clear to auscultation bilaterally Breast:  No dominant palpable mass, retraction, or nipple discharge Cardiovascular: Regular rate and rhythm Abdomen:  Soft, non tender, no hepatosplenomegaly Pelvic:  External genitalia is normal in appearance.  The vagina is normal in appearance.  The cervix is bulbous,Pap with HPV performed.  Uterus is felt to be normal size, shape, and contour.  No                adnexal masses or tenderness noted. Rectal: Good sphincter tone, no polyps, or hemorrhoids felt.  Hemoccult negative. Extremities:  No swelling or varicosities noted Psych:  No mood changes, alert and cooperative,seems happy   Impression: Yearly gyn exam Herpes  Plan: Will increase valtrex to 1 gm bid #60 with prn refill Physical in 1 year Mammogram yearly  Labs with PCP Call if  periods become a problem

## 2014-06-17 ENCOUNTER — Encounter: Payer: Self-pay | Admitting: Adult Health

## 2014-07-15 ENCOUNTER — Encounter (HOSPITAL_COMMUNITY): Payer: Self-pay | Admitting: *Deleted

## 2014-07-15 ENCOUNTER — Emergency Department (HOSPITAL_COMMUNITY)
Admission: EM | Admit: 2014-07-15 | Discharge: 2014-07-15 | Disposition: A | Discharge status: Home / Self Care | Payer: BC Managed Care – PPO | Attending: Emergency Medicine | Admitting: Emergency Medicine

## 2014-07-15 ENCOUNTER — Emergency Department (HOSPITAL_COMMUNITY): Payer: BC Managed Care – PPO

## 2014-07-15 DIAGNOSIS — R079 Chest pain, unspecified: Secondary | ICD-10-CM | POA: Diagnosis present

## 2014-07-15 DIAGNOSIS — K219 Gastro-esophageal reflux disease without esophagitis: Secondary | ICD-10-CM | POA: Insufficient documentation

## 2014-07-15 DIAGNOSIS — F419 Anxiety disorder, unspecified: Secondary | ICD-10-CM | POA: Insufficient documentation

## 2014-07-15 DIAGNOSIS — I493 Ventricular premature depolarization: Secondary | ICD-10-CM | POA: Diagnosis not present

## 2014-07-15 DIAGNOSIS — Z79899 Other long term (current) drug therapy: Secondary | ICD-10-CM | POA: Diagnosis not present

## 2014-07-15 DIAGNOSIS — Z8619 Personal history of other infectious and parasitic diseases: Secondary | ICD-10-CM | POA: Insufficient documentation

## 2014-07-15 DIAGNOSIS — I10 Essential (primary) hypertension: Secondary | ICD-10-CM | POA: Diagnosis not present

## 2014-07-15 HISTORY — DX: Essential (primary) hypertension: I10

## 2014-07-15 LAB — CBC
HEMATOCRIT: 36.8 % (ref 36.0–46.0)
HEMOGLOBIN: 12.1 g/dL (ref 12.0–15.0)
MCH: 28.8 pg (ref 26.0–34.0)
MCHC: 32.9 g/dL (ref 30.0–36.0)
MCV: 87.6 fL (ref 78.0–100.0)
Platelets: 221 10*3/uL (ref 150–400)
RBC: 4.2 MIL/uL (ref 3.87–5.11)
RDW: 15.1 % (ref 11.5–15.5)
WBC: 8.6 10*3/uL (ref 4.0–10.5)

## 2014-07-15 LAB — BASIC METABOLIC PANEL
Anion gap: 14 (ref 5–15)
BUN: 12 mg/dL (ref 6–23)
CHLORIDE: 102 meq/L (ref 96–112)
CO2: 24 mEq/L (ref 19–32)
Calcium: 9.1 mg/dL (ref 8.4–10.5)
Creatinine, Ser: 1.03 mg/dL (ref 0.50–1.10)
GFR calc Af Amer: 77 mL/min — ABNORMAL LOW (ref >90)
GFR calc non Af Amer: 67 mL/min — ABNORMAL LOW (ref >90)
GLUCOSE: 96 mg/dL (ref 70–99)
POTASSIUM: 4 meq/L (ref 3.7–5.3)
Sodium: 140 mEq/L (ref 137–147)

## 2014-07-15 LAB — TROPONIN I: Troponin I: <0.3 ng/mL (ref <0.30)

## 2014-07-15 MED ORDER — METOPROLOL TARTRATE 25 MG PO TABS: 25.0000 mg | ORAL_TABLET | Freq: Two times a day (BID) | ORAL | Status: DC

## 2014-07-15 NOTE — Discharge Instructions (Signed)
Premature Ventricular Contraction Premature ventricular contraction (PVC) is an irregularity of the heart rhythm involving extra or skipped heartbeats. In some cases, they may occur without obvious cause or heart disease. Other times, they can be caused by an electrolyte change in the blood. These need to be corrected. They can also be seen when there is not enough oxygen going to the heart. A common cause of this is plaque or cholesterol buildup. This buildup decreases the blood supply to the heart. In addition, extra beats may be caused or aggravated by:  Excessive smoking.  Alcohol consumption.  Caffeine.  Certain medications  Some street drugs. SYMPTOMS   The sensation of feeling your heart skipping a beat (palpitations).  In many cases, the person may have no symptoms. SIGNS AND TESTS   A physical examination may show an occasional irregularity, but if the PVC beats do not happen often, they may not be found on physical exam.  Blood pressure is usually normal.  Other tests that may find extra beats of the heart are:  An EKG (electrocardiogram)  A Holter monitor which can monitor your heart over longer periods of time  An Angiogram (study of the heart arteries). TREATMENT  Usually extra heartbeats do not need treatment. The condition is treated only if symptoms are severe or if extra beats are very frequent or are causing problems. An underlying cause, if discovered, may also require treatment.  Treatment may also be needed if there may be a risk for other more serious cardiac arrhythmias.  PREVENTION   Moderation in caffeine, alcohol, and tobacco use may reduce the risk of ectopic heartbeats in some people.  Exercise often helps people who lead a sedentary (inactive) lifestyle. PROGNOSIS  PVC heartbeats are generally harmless and do not need treatment.  RISKS AND COMPLICATIONS   Ventricular tachycardia (occasionally).  There usually are no complications.  Other  arrhythmias (occasionally). SEEK IMMEDIATE MEDICAL CARE IF:   You feel palpitations that are frequent or continual.  You develop chest pain or other problems such as shortness of breath, sweating, or nausea and vomiting.  You become light-headed or faint (pass out).  You get worse or do not improve with treatment. Document Released: 03/19/2004 Document Revised: 10/25/2011 Document Reviewed: 09/29/2007 [facility] Patient Information 2015 [place], [place]. This information is not intended to replace advice given to you by your health care provider. Make sure you discuss any questions you have with your health care provider.   As discussed,  Try cutting back on your caffeine intake which may help alleviate these palpitations. Cut back gradually over a weeks time or you may develop caffeine withdrawal headaches.  If this does not resolve your symptoms,  You can start taking the prescription

## 2014-07-15 NOTE — ED Provider Notes (Signed)
CSN: [id number]     Arrival date & time 07/15/14  2409 History  This chart was scribed for Evalee Jefferson, PA-C with Maudry Diego, MD by Edison Simon, ED Scribe. This patient was seen in room APA10/APA10 and the patient's care was started at 11:17 AM.    Chief Complaint  Patient presents with  . Chest Pain   The history is provided by the patient. No language interpreter was used.    HPI Comments: Susan [name] is a 43 y.o. female with history of hypertension and GERD who presents to the Emergency Department complaining of intermittent heart "fluttering" with onset 2 days ago in the morning while lying in bed. She states they last a few seconds at a time; she reports shortness of breath associated with the fluttering. She states she has increased frequency when she is working and states she has felt it since being here but at a lower frequency. She notes waxing and waning, dull, left-sided chest pain with onset 1-2 months ago that is sometimes concurrent with her fluttering. She states that she feels it even when she is still. She suspects her chest pain is muscular or due to GERD. She also notes a recent diagnosis of an ovarian cyst and abdominal pain. She denies recent changes in diet, medication, or caffeine intake except for when she started night shifts 4 months ago when she started to consume more caffeine. She states she saw a cardiologist at [facility] 2 years ago with negative stress test. She reports surgical history of C-section. She states her diabetes is followed by her PCP. She denies FHx of cardiac disease except for a brother with cardiomegaly and hypertension.  Past Medical History  Diagnosis Date  . Herpes simplex without mention of complication   . Anxiety   . Reflux   . History of herpes simplex infection 12/18/2013  . Hypertension    Past Surgical History  Procedure Laterality Date  . Tubal ligation  2006  . Dilation and curettage of uterus    . Endometrial ablation      Family History  Problem Relation Age of Onset  . Hypertension Maternal Grandmother   . Breast cancer Other    History  Substance Use Topics  . Smoking status: Never Smoker   . Smokeless tobacco: Never Used  . Alcohol Use: No     Comment: occ.   OB History    Gravida Para Term Preterm AB TAB SAB Ectopic Multiple Living   3 2   1  1   2      Review of Systems  Constitutional: Negative for fever.  HENT: Negative for congestion and sore throat.   Eyes: Negative.   Respiratory: Positive for shortness of breath. Negative for chest tightness.   Cardiovascular: Positive for chest pain and palpitations.  Gastrointestinal: Negative for nausea.  Genitourinary: Negative.   Musculoskeletal: Negative for joint swelling, arthralgias and neck pain.  Skin: Negative.  Negative for rash and wound.  Neurological: Negative for dizziness, weakness, light-headedness, numbness and headaches.  Psychiatric/Behavioral: Negative.       Allergies  Review of patient's allergies indicates no known allergies.  Home Medications   Prior to Admission medications   Medication Sig Start Date End Date Taking? Authorizing Provider  ibuprofen (ADVIL,MOTRIN) 200 MG tablet Take 400 mg by mouth every 6 (six) hours as needed for moderate pain.   Yes Historical Provider, MD  lisinopril (PRINIVIL,ZESTRIL) 10 MG tablet Take 10 mg by mouth daily. 06/20/14  Yes  Historical Provider, MD  metFORMIN (GLUCOPHAGE) 500 MG tablet Take 500 mg by mouth 2 (two) times daily with a meal.    Yes Historical Provider, MD  Multiple Vitamins-Minerals (HAIR/SKIN/NAILS PO) Take 1 tablet by mouth daily.   Yes Historical Provider, MD  omeprazole (PRILOSEC) 40 MG capsule TAKE 1 CAPSULE BY MOUTH 30 MINUTES BEFORE BREAKFAST 12/12/12  Yes Rogene Houston, MD  rosuvastatin (CRESTOR) 10 MG tablet Take 10 mg by mouth daily.   Yes Historical Provider, MD  valACYclovir (VALTREX) 1000 MG tablet Take 1 bid Patient taking differently: Take 1,000 mg  by mouth 2 (two) times daily. Take 1 bid 12/18/13  Yes Estill Dooms, NP  metoprolol (LOPRESSOR) 25 MG tablet Take 1 tablet (25 mg total) by mouth 2 (two) times daily. 07/15/14   Evalee Jefferson, PA-C   BP 122/71 mmHg  Pulse 73  Temp(Src) 99.1 F (37.3 C) (Oral)  Resp 17  Ht 4\' 11"  (1.499 m)  Wt 230 lb (104.327 kg)  BMI 46.43 kg/m2  SpO2 97%  LMP 07/01/2014 Physical Exam  Constitutional: She appears well-developed and well-nourished.  HENT:  Head: Normocephalic and atraumatic.  Eyes: Conjunctivae are normal.  Neck: Normal range of motion.  Cardiovascular: Normal rate, regular rhythm, normal heart sounds and intact distal pulses.   No murmur heard. She has infrequent PVCs on monitor  Pulmonary/Chest: Effort normal and breath sounds normal. No respiratory distress. She has no wheezes. She has no rales.  Abdominal: Soft. Bowel sounds are normal. There is no tenderness.  Musculoskeletal: Normal range of motion.  Neurological: She is alert.  Skin: Skin is warm and dry.  Psychiatric: She has a normal mood and affect.  Nursing note and vitals reviewed.   ED Course  Procedures (including critical care time)  DIAGNOSTIC STUDIES: Oxygen Saturation is 98% on room air, normal by my interpretation.    COORDINATION OF CARE: 11:31 AM Discussed treatment plan with patient at beside, including blood work.  The patient agrees with the plan and has no further questions at this time.   Labs Review Labs Reviewed  BASIC METABOLIC PANEL - Abnormal; Notable for the following:    GFR calc non Af Amer 67 (*)    GFR calc Af Amer 77 (*)    All other components within normal limits  CBC  TROPONIN I    Imaging Review Dg Chest Port 1 View  07/15/2014   CLINICAL DATA:  Chest pain beginning today.  EXAM: PORTABLE CHEST - 1 VIEW  COMPARISON:  Single view of the chest 12/16/2009. PA and lateral chest 05/28/2004.  FINDINGS: The lungs are clear. Heart size is normal. No pneumothorax or pleural  effusion.  IMPRESSION: Negative chest.   Electronically Signed   By: Inge Rise M.D.   On: 07/15/2014 09:51     EKG Interpretation   Date/Time:  Monday July 15 2014 09:12:56 EST Ventricular Rate:  89 PR Interval:  153 QRS Duration: 83 QT Interval:  355 QTC Calculation: 432 R Axis:   56 Text Interpretation:  Sinus rhythm Confirmed by ZAMMIT  MD, JOSEPH [phone])  on 07/15/2014 11:01:33 AM      MDM   Final diagnoses:  PVC (premature ventricular contraction)    Patients labs and/or radiological studies were viewed and considered during the medical decision making and disposition process. Pt was also seen by Dr. Roderic Palau during this visit.  Pt was advised to cut back on caffeine.  She has been drinking several cups of coffee and then  at least #2 20 oz caffeinated colas at work daily.  She was advised to cut back gradually over the next week to avoid rebound headache.  Prescribed metoprolol 25 mg tabs to get filled if sx persist with caffeine reduction.  Advised f/u with pcp within 1 week (is seen at the [facility] in [place]).  I personally performed the services described in this documentation, which was scribed in my presence. The recorded information has been reviewed and is accurate.   Evalee Jefferson, PA-C 07/15/14 1737  Maudry Diego, MD 07/16/14 [phone]

## 2014-07-15 NOTE — ED Notes (Signed)
Pt states she began having a  heart "fluttering" feeling on Saturday with chest pain beginning today. Pain in localized to her left side chest with SOB when she feels like her chest is fluttering. Pt has intermittent nausea and dizziness. Pt in NAD at this time.

## 2014-07-25 ENCOUNTER — Encounter: Payer: Self-pay | Admitting: Obstetrics and Gynecology

## 2014-07-25 ENCOUNTER — Ambulatory Visit (INDEPENDENT_AMBULATORY_CARE_PROVIDER_SITE_OTHER): Payer: BC Managed Care – PPO | Admitting: Obstetrics and Gynecology

## 2014-07-25 VITALS — BP 130/90 | Ht 59.0 in | Wt 233.0 lb

## 2014-07-25 DIAGNOSIS — N8320 Unspecified ovarian cysts: Secondary | ICD-10-CM

## 2014-07-25 DIAGNOSIS — N83202 Unspecified ovarian cyst, left side: Secondary | ICD-10-CM

## 2014-07-25 NOTE — Progress Notes (Signed)
Patient ID: Susan [name], female   DOB: May 09, 1971, 43 y.o.   MRN: [id number] Pt here today for left ovarian cyst. Pt has had an [place] for this about a month ago. Pt states that the pain comes and goes.

## 2014-07-25 NOTE — Progress Notes (Signed)
Patient ID: Susan [name], female   DOB: [birth year], 43 y.o.   MRN: [id number]   [facility] Visit  Patient name: Susan [name] MRN [id number]  Date of birth: [birth year]  CC & HPI:  [name] is a 43 y.o. female presenting today for a follow-up visit concerning an u/s showing an ovarian cyst on her left ovary.  She had the u/s performed at [facility].  She lists irregular menses and intermittent, sharp, cramping lower abdominal pain radiating to her back as associated symptoms.  Intercourse aggravates her abdominal pain.  She has a history of endometrial ablation and states that it helped her menses up until last month when she noticed some spotting and dark, red blood blood.  She does take blood pressure medication. S/p BTL and endometrial ablation, as well as C/S x 2, no vaginal deliveries  ROS:  All systems have been reviewed and are negative unless otherwise specified in the HPI.  + postcoital discomfort and cramping. + cramping with menses  Pertinent History Reviewed:   Reviewed: Significant for  Medical         Past Medical History  Diagnosis Date  . Herpes simplex without mention of complication   . Anxiety   . Reflux   . History of herpes simplex infection 12/18/2013  . Hypertension   . Ovarian cyst                               Surgical Hx:    Past Surgical History  Procedure Laterality Date  . Tubal ligation  2006  . Dilation and curettage of uterus    . Endometrial ablation     Medications: Reviewed & Updated - see associated section                      Current outpatient prescriptions: lisinopril (PRINIVIL,ZESTRIL) 10 MG tablet, Take 10 mg by mouth daily., Disp: , Rfl: 2;  metFORMIN (GLUCOPHAGE) 500 MG tablet, Take 500 mg by mouth 2 (two) times daily with a meal. , Disp: , Rfl: ;  metoprolol (LOPRESSOR) 25 MG tablet, Take 1 tablet (25 mg total) by mouth 2 (two) times daily., Disp: 30 tablet, Rfl: 0;  Multiple Vitamins-Minerals (HAIR/SKIN/NAILS PO),  Take 1 tablet by mouth daily., Disp: , Rfl:  omeprazole (PRILOSEC) 40 MG capsule, TAKE 1 CAPSULE BY MOUTH 30 MINUTES BEFORE BREAKFAST, Disp: 30 capsule, Rfl: 5;  rosuvastatin (CRESTOR) 10 MG tablet, Take 10 mg by mouth daily., Disp: , Rfl: ;  valACYclovir (VALTREX) 1000 MG tablet, Take 1 bid (Patient taking differently: Take 1,000 mg by mouth 2 (two) times daily. Take 1 bid), Disp: 60 tablet, Rfl: PRN;  DEXILANT 60 MG capsule, , Disp: , Rfl: 0  Social History: Reviewed -  reports that she has never smoked. She has never used smokeless tobacco.  Objective Findings:  Vitals: Blood pressure 130/90, height 4\' 11"  (1.499 m), weight 233 lb (105.688 kg), last menstrual period 07/01/2014.  Physical Examination: General appearance - alert, well appearing, and in no distress, oriented to person, place, and time and overweight Pelvic - normal external genitalia, vulva, vagina, cervix, uterus and adnexa,  VULVA: normal appearing vulva with no masses, tenderness or lesions,  VAGINA: normal appearing vagina with normal color and no abnormal discharge, no lesions, good vaginal length, good support CERVIX: normal appearing cervix without discharge or lesions, tiny, midline UTERUS: uterus is normal size,  shape, consistency and nontender, tilts forward, anterior ADNEXA: normal adnexa in size, nontender and no masses  Assessment & Plan:   A:  1. Ovarian cyst; left 2. Postcoital cramping  P: followup u.s by phone 1. Follow-up in 3 months for review of sx.  This chart was scribed for Jonnie Kind, MD by Donato Schultz, ED Scribe. This patient was seen in Room 1 and the patient's care was started at 11:20 AM.

## 2014-08-08 ENCOUNTER — Encounter: Payer: Self-pay | Admitting: Obstetrics and Gynecology

## 2014-08-21 ENCOUNTER — Encounter: Payer: Self-pay | Admitting: Obstetrics and Gynecology

## 2014-10-24 ENCOUNTER — Encounter: Payer: Self-pay | Admitting: Obstetrics and Gynecology

## 2014-10-24 ENCOUNTER — Ambulatory Visit (INDEPENDENT_AMBULATORY_CARE_PROVIDER_SITE_OTHER): Payer: BLUE CROSS/BLUE SHIELD | Admitting: Obstetrics and Gynecology

## 2014-10-24 VITALS — BP 120/70 | HR 76 | Ht 59.0 in | Wt 238.0 lb

## 2014-10-24 DIAGNOSIS — N8329 Other ovarian cysts: Secondary | ICD-10-CM | POA: Diagnosis not present

## 2014-10-24 DIAGNOSIS — N83299 Other ovarian cyst, unspecified side: Secondary | ICD-10-CM

## 2014-10-24 NOTE — Progress Notes (Signed)
Patient ID: [name], female   DOB: 11/05/1970, 44 y.o.   MRN: [id number]  This chart was SCRIBED for Mallory Shirk, MD by Stephania Fragmin, ED Scribe. This patient was seen in room 1 and the patient's care was started at 10:06 AM.   [name]  Patient name: Susan [name] MRN [id number]  Date of birth: 29-Jun-1971  CC & HPI:  Susan [name] is a 44 y.o. female presenting today for a follow-up for ovarian cysts. She reports sharp menstrual cramps that onset 1 week before her menses.  She takes ibuprofen and applies heating pads, which alleviates her symptoms. She reports clotting with her menses, which last about 4 days.   ROS:  A complete 10 system review of systems was obtained and all systems are negative except as noted in the HPI and PMH.    Pertinent History Reviewed:   Reviewed: Significant for ovarian cyst, tubal ligation, dilation and curettage of uterus, endometrial ablation Medical         Past Medical History  Diagnosis Date  . Herpes simplex without mention of complication   . Anxiety   . Reflux   . History of herpes simplex infection 12/18/2013  . Hypertension   . Ovarian cyst                               Surgical Hx:    Past Surgical History  Procedure Laterality Date  . Tubal ligation  2006  . Dilation and curettage of uterus    . Endometrial ablation     Medications: Reviewed & Updated - see associated section                       Current outpatient prescriptions:  .  DEXILANT 60 MG capsule, , Disp: , Rfl: 0 .  lisinopril (PRINIVIL,ZESTRIL) 10 MG tablet, Take 10 mg by mouth daily., Disp: , Rfl: 2 .  metFORMIN (GLUCOPHAGE) 500 MG tablet, Take 500 mg by mouth 2 (two) times daily with a meal. , Disp: , Rfl:  .  metoprolol (LOPRESSOR) 25 MG tablet, Take 1 tablet (25 mg total) by mouth 2 (two) times daily., Disp: 30 tablet, Rfl: 0 .  Multiple Vitamins-Minerals (HAIR/SKIN/NAILS PO), Take 1 tablet by mouth daily., Disp: , Rfl:  .  omeprazole  (PRILOSEC) 40 MG capsule, TAKE 1 CAPSULE BY MOUTH 30 MINUTES BEFORE BREAKFAST, Disp: 30 capsule, Rfl: 5 .  rosuvastatin (CRESTOR) 10 MG tablet, Take 10 mg by mouth daily., Disp: , Rfl:  .  valACYclovir (VALTREX) 1000 MG tablet, Take 1 bid (Patient taking differently: Take 1,000 mg by mouth 2 (two) times daily. Take 1 bid), Disp: 60 tablet, Rfl: PRN   Social History: Reviewed -  reports that she has never smoked. She has never used smokeless tobacco.  Objective Findings:  Vitals: Blood pressure 120/70, pulse 76, height 4\' 11"  (1.499 m), weight 238 lb (107.956 kg), last menstrual period 10/20/2014.  Physical Examination:  Patient only here to discuss ovarian cysts.   Assessment & Plan:   A:  1. Ovarian cysts, resolved 2. Normal menses s/p ablation  P:  1. followup prn.    I personally performed the services described in this documentation, which was SCRIBED in my presence. The recorded information has been reviewed and considered accurate. It has been edited as necessary during review. Jonnie Kind, MD

## 2014-10-24 NOTE — Progress Notes (Signed)
Patient ID: Susan [name], female   DOB: 10-14-1970, 44 y.o.   MRN: [id number] Pt here today for follow up on cysts. Pt states that she has had some cramping. Pt states that she has sharp pains about a week before her period starts.

## 2015-01-27 DIAGNOSIS — M7582 Other shoulder lesions, left shoulder: Secondary | ICD-10-CM | POA: Insufficient documentation

## 2015-01-28 ENCOUNTER — Other Ambulatory Visit: Payer: Self-pay | Admitting: Surgery

## 2015-01-28 DIAGNOSIS — M7582 Other shoulder lesions, left shoulder: Secondary | ICD-10-CM

## 2015-01-28 DIAGNOSIS — M778 Other enthesopathies, not elsewhere classified: Secondary | ICD-10-CM

## 2015-01-28 DIAGNOSIS — M25512 Pain in left shoulder: Secondary | ICD-10-CM

## 2015-02-05 ENCOUNTER — Ambulatory Visit: Payer: BLUE CROSS/BLUE SHIELD

## 2015-02-11 ENCOUNTER — Ambulatory Visit
Admission: RE | Admit: 2015-02-11 | Discharge: 2015-02-11 | Disposition: A | Discharge status: Home / Self Care | Payer: BLUE CROSS/BLUE SHIELD | Source: Ambulatory Visit | Attending: Surgery | Admitting: Surgery

## 2015-02-11 ENCOUNTER — Other Ambulatory Visit: Payer: Self-pay | Admitting: Surgery

## 2015-02-11 DIAGNOSIS — M7582 Other shoulder lesions, left shoulder: Secondary | ICD-10-CM

## 2015-02-11 DIAGNOSIS — M778 Other enthesopathies, not elsewhere classified: Secondary | ICD-10-CM

## 2015-02-11 DIAGNOSIS — M25512 Pain in left shoulder: Secondary | ICD-10-CM

## 2015-02-11 MED ORDER — GADOBENATE DIMEGLUMINE 529 MG/ML IV SOLN: 0.1000 mL | Freq: Once | INTRAVENOUS | Status: AC | PRN

## 2015-02-11 MED ORDER — IOHEXOL 300 MG/ML  SOLN: 10.0000 mL | Freq: Once | INTRAMUSCULAR | Status: AC | PRN

## 2015-02-19 ENCOUNTER — Ambulatory Visit: Payer: BLUE CROSS/BLUE SHIELD | Attending: Surgery

## 2015-02-19 ENCOUNTER — Ambulatory Visit: Admission: RE | Admit: 2015-02-19 | Payer: BLUE CROSS/BLUE SHIELD | Source: Ambulatory Visit

## 2015-02-27 ENCOUNTER — Other Ambulatory Visit: Payer: Self-pay | Admitting: Adult Health

## 2015-03-26 ENCOUNTER — Emergency Department (HOSPITAL_COMMUNITY): Payer: BLUE CROSS/BLUE SHIELD

## 2015-03-26 ENCOUNTER — Emergency Department (HOSPITAL_COMMUNITY)
Admission: EM | Admit: 2015-03-26 | Discharge: 2015-03-26 | Disposition: A | Discharge status: Home / Self Care | Payer: BLUE CROSS/BLUE SHIELD | Attending: Emergency Medicine | Admitting: Emergency Medicine

## 2015-03-26 ENCOUNTER — Encounter (HOSPITAL_COMMUNITY): Payer: Self-pay

## 2015-03-26 DIAGNOSIS — Z79899 Other long term (current) drug therapy: Secondary | ICD-10-CM | POA: Diagnosis not present

## 2015-03-26 DIAGNOSIS — K219 Gastro-esophageal reflux disease without esophagitis: Secondary | ICD-10-CM | POA: Insufficient documentation

## 2015-03-26 DIAGNOSIS — I10 Essential (primary) hypertension: Secondary | ICD-10-CM | POA: Diagnosis not present

## 2015-03-26 DIAGNOSIS — J159 Unspecified bacterial pneumonia: Secondary | ICD-10-CM | POA: Insufficient documentation

## 2015-03-26 DIAGNOSIS — Z8742 Personal history of other diseases of the female genital tract: Secondary | ICD-10-CM | POA: Diagnosis not present

## 2015-03-26 DIAGNOSIS — Z8619 Personal history of other infectious and parasitic diseases: Secondary | ICD-10-CM | POA: Diagnosis not present

## 2015-03-26 DIAGNOSIS — J189 Pneumonia, unspecified organism: Secondary | ICD-10-CM

## 2015-03-26 DIAGNOSIS — R05 Cough: Secondary | ICD-10-CM | POA: Diagnosis present

## 2015-03-26 MED ORDER — LEVOFLOXACIN 500 MG PO TABS: 500.0000 mg | ORAL_TABLET | Freq: Every day | ORAL | Status: DC

## 2015-03-26 MED ORDER — ALBUTEROL SULFATE HFA 108 (90 BASE) MCG/ACT IN AERS
2.0000 | INHALATION_SPRAY | RESPIRATORY_TRACT | Status: DC
  Administered 2015-03-26: 2 via RESPIRATORY_TRACT
  Filled 2015-03-26: qty 6.7

## 2015-03-26 MED ORDER — LEVOFLOXACIN 500 MG PO TABS
500.0000 mg | ORAL_TABLET | Freq: Once | ORAL | Status: AC
  Administered 2015-03-26: 500 mg via ORAL
  Filled 2015-03-26: qty 1

## 2015-03-26 NOTE — Discharge Instructions (Signed)

## 2015-03-26 NOTE — ED Provider Notes (Signed)
CSN: [id number]     Arrival date & time 03/26/15  1553 History   First MD Initiated Contact with Patient 03/26/15 1913     Chief Complaint  Patient presents with  . URI      HPI Pt reports productive cough with low grade fevers for several days. Occasional palpitations. No syncope or presyncope. No CP. No abd pain. Denies n/v/d. No other complaints at this time. Pt is concerned she could have PNA.    Past Medical History  Diagnosis Date  . Herpes simplex without mention of complication   . Anxiety   . Reflux   . History of herpes simplex infection 12/18/2013  . Hypertension   . Ovarian cyst    Past Surgical History  Procedure Laterality Date  . Tubal ligation  2006  . Dilation and curettage of uterus    . Endometrial ablation     Family History  Problem Relation Age of Onset  . Hypertension Maternal Grandmother   . Breast cancer Other    Social History  Substance Use Topics  . Smoking status: Never Smoker   . Smokeless tobacco: Never Used  . Alcohol Use: Yes     Comment: occ.   OB History    Gravida Para Term Preterm AB TAB SAB Ectopic Multiple Living   3 2   1  1   2      Review of Systems  All other systems reviewed and are negative.     Allergies  Review of patient's allergies indicates no known allergies.  Home Medications   Prior to Admission medications   Medication Sig Start Date End Date Taking? Authorizing Provider  DEXILANT 60 MG capsule  06/20/14   Historical Provider, MD  levofloxacin (LEVAQUIN) 500 MG tablet Take 1 tablet (500 mg total) by mouth daily. 03/26/15   Jola Schmidt, MD  lisinopril (PRINIVIL,ZESTRIL) 10 MG tablet Take 10 mg by mouth daily. 06/20/14   Historical Provider, MD  metFORMIN (GLUCOPHAGE) 500 MG tablet Take 500 mg by mouth 2 (two) times daily with a meal.     Historical Provider, MD  metoprolol (LOPRESSOR) 25 MG tablet Take 1 tablet (25 mg total) by mouth 2 (two) times daily. 07/15/14   Evalee Jefferson, PA-C  Multiple  Vitamins-Minerals (HAIR/SKIN/NAILS PO) Take 1 tablet by mouth daily.    Historical Provider, MD  omeprazole (PRILOSEC) 40 MG capsule TAKE 1 CAPSULE BY MOUTH 30 MINUTES BEFORE BREAKFAST 12/12/12   Rogene Houston, MD  rosuvastatin (CRESTOR) 10 MG tablet Take 10 mg by mouth daily.    Historical Provider, MD  valACYclovir (VALTREX) 1000 MG tablet TAKE 1 TABLET BY MOUTH TWICE DAILY 02/27/15   Estill Dooms, NP   BP 138/67 mmHg  Pulse 105  Temp(Src) 99 F (37.2 C) (Oral)  Resp 20  Ht 4\' 11"  (1.499 m)  Wt 240 lb (108.863 kg)  BMI 48.45 kg/m2  SpO2 100%  LMP 03/16/2015 Physical Exam  Constitutional: She is oriented to person, place, and time. She appears well-developed and well-nourished. No distress.  HENT:  Head: Normocephalic and atraumatic.  Eyes: EOM are normal.  Neck: Normal range of motion.  Cardiovascular: Normal rate, regular rhythm and normal heart sounds.   Pulmonary/Chest: Effort normal and breath sounds normal.  Abdominal: Soft. She exhibits no distension. There is no tenderness.  Musculoskeletal: Normal range of motion.  Neurological: She is alert and oriented to person, place, and time.  Skin: Skin is warm and dry.  Psychiatric: She has a  normal mood and affect. Judgment normal.  Nursing note and vitals reviewed.   ED Course  Procedures (including critical care time) Labs Review Labs Reviewed - No data to display  Imaging Review Dg Chest 2 View  03/26/2015   CLINICAL DATA:  Shortness of breath, intermittent fever, productive cough with green sputum, palpitations, history hypertension  EXAM: CHEST  2 VIEW  COMPARISON:  07/15/2014  FINDINGS: Normal heart size, mediastinal contours, and pulmonary vascularity.  RIGHT upper lobe infiltrate consistent with pneumonia.  Mild central peribronchial thickening.  No additional infiltrate, pleural effusion or pneumothorax.  Bones unremarkable.  IMPRESSION: Bronchitic changes with RIGHT upper lobe infiltrate consistent with  pneumonia.   Electronically Signed   By: Lavonia Dana M.D.   On: 03/26/2015 16:26  I personally reviewed the imaging tests through PACS system I reviewed available ER/hospitalization records through the EMR    EKG Interpretation   Date/Time:  Wednesday March 26 2015 15:59:32 EDT Ventricular Rate:  86 PR Interval:  136 QRS Duration: 74 QT Interval:  342 QTC Calculation: 409 R Axis:   56 Text Interpretation:  Normal sinus rhythm Normal ECG No significant change  was found Confirmed by Jema Deegan  MD, Lennette Bihari ([id number]) on 03/26/2015 4:04:17 PM      MDM   Final diagnoses:  CAP (community acquired pneumonia)    levaquin for CAP. No hypoxia or increased work of breathing. Albuterol for cough. Strict return precautions given    Jola Schmidt, MD 03/26/15 [phone]

## 2015-03-26 NOTE — ED Notes (Signed)
Pt reports intermittent fever, productive cough with green sputum, and heart palpitations.  Has been taking tylenol cold without relief.

## 2016-04-08 ENCOUNTER — Other Ambulatory Visit: Payer: Self-pay | Admitting: Adult Health

## 2016-07-26 DIAGNOSIS — I10 Essential (primary) hypertension: Secondary | ICD-10-CM | POA: Diagnosis not present

## 2016-07-26 DIAGNOSIS — B9689 Other specified bacterial agents as the cause of diseases classified elsewhere: Secondary | ICD-10-CM | POA: Diagnosis not present

## 2016-07-26 DIAGNOSIS — Z8639 Personal history of other endocrine, nutritional and metabolic disease: Secondary | ICD-10-CM | POA: Diagnosis not present

## 2016-07-26 DIAGNOSIS — J019 Acute sinusitis, unspecified: Secondary | ICD-10-CM | POA: Diagnosis not present

## 2016-08-06 ENCOUNTER — Emergency Department (HOSPITAL_COMMUNITY)
Admission: EM | Admit: 2016-08-06 | Discharge: 2016-08-06 | Disposition: A | Discharge status: Home / Self Care | Payer: BLUE CROSS/BLUE SHIELD | Attending: Emergency Medicine | Admitting: Emergency Medicine

## 2016-08-06 ENCOUNTER — Encounter (HOSPITAL_COMMUNITY): Payer: Self-pay | Admitting: Emergency Medicine

## 2016-08-06 DIAGNOSIS — I10 Essential (primary) hypertension: Secondary | ICD-10-CM | POA: Diagnosis not present

## 2016-08-06 DIAGNOSIS — R509 Fever, unspecified: Secondary | ICD-10-CM | POA: Diagnosis present

## 2016-08-06 DIAGNOSIS — J111 Influenza due to unidentified influenza virus with other respiratory manifestations: Secondary | ICD-10-CM | POA: Insufficient documentation

## 2016-08-06 DIAGNOSIS — Z7984 Long term (current) use of oral hypoglycemic drugs: Secondary | ICD-10-CM | POA: Diagnosis not present

## 2016-08-06 DIAGNOSIS — Z79899 Other long term (current) drug therapy: Secondary | ICD-10-CM | POA: Insufficient documentation

## 2016-08-06 MED ORDER — GUAIFENESIN-CODEINE 100-10 MG/5ML PO SYRP: 10.0000 mL | ORAL_SOLUTION | Freq: Three times a day (TID) | ORAL | 0 refills | Status: DC | PRN

## 2016-08-06 MED ORDER — IBUPROFEN 800 MG PO TABS: 800.0000 mg | ORAL_TABLET | Freq: Three times a day (TID) | ORAL | 0 refills | Status: DC

## 2016-08-06 MED ORDER — OSELTAMIVIR PHOSPHATE 75 MG PO CAPS: 75.0000 mg | ORAL_CAPSULE | Freq: Two times a day (BID) | ORAL | 0 refills | Status: DC

## 2016-08-06 MED ORDER — IBUPROFEN 800 MG PO TABS
800.0000 mg | ORAL_TABLET | Freq: Once | ORAL | Status: AC
  Administered 2016-08-06: 800 mg via ORAL
  Filled 2016-08-06: qty 1

## 2016-08-06 NOTE — Discharge Instructions (Signed)
Its important to drink plenty of fluids primarily water.  Alternate tylenol with the ibuprofen every 4 and 6 hrs.  Follow-up with your doctor or return to ER for any worsening symptoms

## 2016-08-06 NOTE — ED Triage Notes (Signed)
Pt was given amoxicillin for sinus infection 2 weeks ago.  Pt having chills, fever, ear pain and bodyaches today. Pt alert and oriented at this time.

## 2016-08-06 NOTE — ED Notes (Signed)
Pt alert & oriented x4, stable gait. Patient given discharge instructions, paperwork & prescription(s). Patient informed not to drive, operate any equipment & handel any important documents 4 hours after taking pain medication. Patient  instructed to stop at the registration desk to finish any additional paperwork. Patient  verbalized understanding. Pt left department w/ no further questions. 

## 2016-08-08 NOTE — ED Provider Notes (Signed)
[name] DEPT Provider Note   CSN: [id number] Arrival date & time: 08/06/16  1716     History   Chief Complaint Chief Complaint  Patient presents with  . Otalgia    HPI IZZIE [name] is a 45 y.o. female.  HPI   Susan [name] is a 45 y.o. female who presents to the Emergency Department complaining of sudden onset of fever, chills, body aches and ear pain.  Onset of symptoms several hours prior to arrival.  She state that she was recently treated by her PMD with amoxil for a sinus infection.  She has completed the amoxil one week ago.  She reports recent sick contacts.  She denies vomiting, abd pain, cough, chest pain or shortness of breath.    Past Medical History:  Diagnosis Date  . Anxiety   . Herpes simplex without mention of complication   . History of herpes simplex infection 12/18/2013  . Hypertension   . Ovarian cyst   . Reflux     Patient Active Problem List   Diagnosis Date Noted  . History of herpes simplex infection 12/18/2013  . Flank pain 09/21/2012  . GERD (gastroesophageal reflux disease) 09/21/2012  . Herpes genitalia 09/21/2012    Past Surgical History:  Procedure Laterality Date  . DILATION AND CURETTAGE OF UTERUS    . ENDOMETRIAL ABLATION    . TUBAL LIGATION  2006    OB History    Gravida Para Term Preterm AB Living   3 2     1 2    SAB TAB Ectopic Multiple Live Births   1               Home Medications    Prior to Admission medications   Medication Sig Start Date End Date Taking? Authorizing Provider  Chlorpheniramine-APAP (CORICIDIN) 2-325 MG TABS Take 1-2 tablets by mouth every 4 (four) hours as needed (for cold symptoms).   Yes Historical Provider, MD  DEXILANT 60 MG capsule Take 60 mg by mouth daily.  06/20/14  Yes Historical Provider, MD  lisinopril (PRINIVIL,ZESTRIL) 10 MG tablet Take 10 mg by mouth daily. 06/20/14  Yes Historical Provider, MD  metFORMIN (GLUCOPHAGE) 1000 MG tablet Take 1,000 mg by mouth 2 (two) times  daily. 06/17/16  Yes Historical Provider, MD  metoprolol (LOPRESSOR) 25 MG tablet Take 1 tablet (25 mg total) by mouth 2 (two) times daily. 07/15/14  Yes Evalee Jefferson, PA-C  Multiple Vitamins-Minerals (HAIR/SKIN/NAILS PO) Take 1 tablet by mouth daily.   Yes Historical Provider, MD  rosuvastatin (CRESTOR) 10 MG tablet Take 10 mg by mouth daily.   Yes Historical Provider, MD  valACYclovir (VALTREX) 1000 MG tablet TAKE 1 TABLET BY MOUTH TWICE DAILY 04/09/16  Yes Estill Dooms, NP  amoxicillin-clavulanate (AUGMENTIN) 875-125 MG tablet Take 1 tablet by mouth 2 (two) times daily. 10 day course starting on 07/26/2016 07/26/16   Historical Provider, MD  guaiFENesin-codeine (ROBITUSSIN AC) 100-10 MG/5ML syrup Take 10 mLs by mouth 3 (three) times daily as needed. 08/06/16   Lovette Merta, PA-C  ibuprofen (ADVIL,MOTRIN) 800 MG tablet Take 1 tablet (800 mg total) by mouth 3 (three) times daily. 08/06/16   Alberta Cairns, PA-C  oseltamivir (TAMIFLU) 75 MG capsule Take 1 capsule (75 mg total) by mouth every 12 (twelve) hours. 08/06/16   Karry Barrilleaux, PA-C    Family History Family History  Problem Relation Age of Onset  . Hypertension Maternal Grandmother   . Breast cancer Other     Social  History Social History  Substance Use Topics  . Smoking status: Never Smoker  . Smokeless tobacco: Never Used  . Alcohol use Yes     Comment: occ.     Allergies   Patient has no known allergies.   Review of Systems Review of Systems  Constitutional: Positive for chills and fever. Negative for activity change and appetite change.  HENT: Positive for congestion and ear pain. Negative for facial swelling, rhinorrhea, sore throat and trouble swallowing.   Eyes: Negative for visual disturbance.  Respiratory: Negative for cough, shortness of breath, wheezing and stridor.   Cardiovascular: Negative for chest pain.  Gastrointestinal: Negative for abdominal pain, nausea and vomiting.  Genitourinary: Negative  for dysuria.  Musculoskeletal: Positive for myalgias. Negative for neck pain and neck stiffness.  Skin: Negative.  Negative for rash.  Neurological: Negative for dizziness, weakness, numbness and headaches.  Hematological: Negative for adenopathy.  Psychiatric/Behavioral: Negative for confusion.  All other systems reviewed and are negative.    Physical Exam Updated Vital Signs BP 129/67 (BP Location: Right Arm)   Pulse 104   Temp 100.9 F (38.3 C) (Oral)   Resp 20   Ht 4\' 11"  (1.499 m)   Wt 99.8 kg   LMP 07/16/2016 (Exact Date)   SpO2 98%   BMI 44.43 kg/m   Physical Exam  Constitutional: She is oriented to person, place, and time. She appears well-developed and well-nourished. No distress.  HENT:  Head: Normocephalic.  Right Ear: Tympanic membrane and ear canal normal.  Left Ear: Tympanic membrane and ear canal normal.  Mouth/Throat: Uvula is midline, oropharynx is clear and moist and mucous membranes are normal.  Eyes: Pupils are equal, round, and reactive to light.  Neck: Normal range of motion. Neck supple. No Kernig's sign noted. No thyromegaly present.  Cardiovascular: Normal rate, regular rhythm and normal heart sounds.   Pulmonary/Chest: Effort normal and breath sounds normal. She has no wheezes.  Abdominal: Soft. Normal appearance. There is no tenderness. There is no rebound and no guarding.  Musculoskeletal: Normal range of motion.  Neurological: She is alert and oriented to person, place, and time.  Skin: Skin is warm and dry. No rash noted.  Psychiatric: She has a normal mood and affect.     ED Treatments / Results  Labs (all labs ordered are listed, but only abnormal results are displayed) Labs Reviewed - No data to display  EKG  EKG Interpretation None       Radiology No results found.  Procedures Procedures (including critical care time)  Medications Ordered in ED Medications  ibuprofen (ADVIL,MOTRIN) tablet 800 mg (800 mg Oral Given  08/06/16 1811)     Initial Impression / Assessment and Plan / ED Course  I have reviewed the triage vital signs and the nursing notes.  Pertinent labs & imaging results that were available during my care of the patient were reviewed by me and considered in my medical decision making (see chart for details).  Clinical Course     Pt febrile on arrival.  Improved after po fluids and ibuprofen.  Pt reports feeling better.  Sx's likely viral and likely influenza.  She is non-toxic appearing.  Appears stable for d/c.  Agrees to symptomatic tx, PMD f/u if needed.   Final Clinical Impressions(s) / ED Diagnoses   Final diagnoses:  Flu syndrome    New Prescriptions Discharge Medication List as of 08/06/2016  7:22 PM    START taking these medications   Details  guaiFENesin-codeine (  ROBITUSSIN AC) 100-10 MG/5ML syrup Take 10 mLs by mouth 3 (three) times daily as needed., Starting Fri 08/06/2016, Print    ibuprofen (ADVIL,MOTRIN) 800 MG tablet Take 1 tablet (800 mg total) by mouth 3 (three) times daily., Starting Fri 08/06/2016, Print    oseltamivir (TAMIFLU) 75 MG capsule Take 1 capsule (75 mg total) by mouth every 12 (twelve) hours., Starting Fri 08/06/2016, Print         Aashvi Rezabek [place], PA-C 08/08/16 2351    Daleen Bo, MD 08/10/16 1255

## 2017-01-27 ENCOUNTER — Other Ambulatory Visit (HOSPITAL_COMMUNITY)
Admission: RE | Admit: 2017-01-27 | Discharge: 2017-01-27 | Disposition: A | Discharge status: Home / Self Care | Payer: BLUE CROSS/BLUE SHIELD | Source: Ambulatory Visit | Attending: Adult Health | Admitting: Adult Health

## 2017-01-27 ENCOUNTER — Encounter: Payer: Self-pay | Admitting: Adult Health

## 2017-01-27 ENCOUNTER — Ambulatory Visit (INDEPENDENT_AMBULATORY_CARE_PROVIDER_SITE_OTHER): Payer: BLUE CROSS/BLUE SHIELD | Admitting: Adult Health

## 2017-01-27 VITALS — BP 118/62 | HR 71 | Ht 60.0 in | Wt 256.5 lb

## 2017-01-27 DIAGNOSIS — N852 Hypertrophy of uterus: Secondary | ICD-10-CM | POA: Diagnosis not present

## 2017-01-27 DIAGNOSIS — Z01419 Encounter for gynecological examination (general) (routine) without abnormal findings: Secondary | ICD-10-CM | POA: Insufficient documentation

## 2017-01-27 DIAGNOSIS — Z1212 Encounter for screening for malignant neoplasm of rectum: Secondary | ICD-10-CM

## 2017-01-27 DIAGNOSIS — N946 Dysmenorrhea, unspecified: Secondary | ICD-10-CM | POA: Diagnosis not present

## 2017-01-27 DIAGNOSIS — Z1211 Encounter for screening for malignant neoplasm of colon: Secondary | ICD-10-CM | POA: Diagnosis not present

## 2017-01-27 LAB — HEMOCCULT GUIAC POC 1CARD (OFFICE): Fecal Occult Blood, POC: NEGATIVE

## 2017-01-27 MED ORDER — PROMETHAZINE HCL 25 MG PO TABS: 25.0000 mg | ORAL_TABLET | Freq: Four times a day (QID) | ORAL | 1 refills | Status: DC | PRN

## 2017-01-27 NOTE — Progress Notes (Signed)
Patient ID: Susan [name], female   DOB: [birth year], 46 y.o.   MRN: [id number] History of Present Illness:  Susan [name] is a 46 year old black female in for well woman gyn exam and pap.She is complaining of sharpe pain in right side recently and has pain before and after period, and periods dark brown and painful and has nausea 1 week before period.She is sp ablation and tubal, and has had ovarian cyst on left in past.   Current Medications, Allergies, Past Medical History, Past Surgical History, Family History and Social History were reviewed in [employer] record.     Review of Systems:  Patient denies any headaches, hearing loss, fatigue, blurred vision, shortness of breath, chest pain, problems with bowel movements, urination, or intercourse. No joint pain or mood swings.See HPI For positives.   Physical Exam:BP 118/62 (BP Location: Left Arm, Patient Position: Sitting, Cuff Size: Large)   Pulse 71   Ht 5' (1.524 m)   Wt 256 lb 8 oz (116.3 kg)   LMP 01/07/2017 (Approximate)   BMI 50.09 kg/m  General:  Well developed, well nourished, no acute distress Skin:  Warm and dry Neck:  Midline trachea, normal thyroid, good ROM, no lymphadenopathy Lungs; Clear to auscultation bilaterally Breast:  No dominant palpable mass, retraction, or nipple discharge Cardiovascular: Regular rate and rhythm Abdomen:  Soft, non tender, no hepatosplenomegaly Pelvic:  External genitalia is normal in appearance, no lesions.  The vagina is normal in appearance. Urethra has no lesions or masses. The cervix is bulbous.Pap with HPV and GC/CHL performed.  Uterus is felt to be enlarged, about 12 weeks.  No adnexal masses or tenderness noted.Bladder is non tender, no masses felt. Rectal: Good sphincter tone, no polyps, or hemorrhoids felt.  Hemoccult negative. Extremities/musculoskeletal:  No swelling or varicosities noted, no clubbing or cyanosis Psych:  No mood changes, alert and cooperative,seems  happy PHQ 2 score 0.Will get [place] to assess uterus.   Impression: 1. Encounter for gynecological examination with Papanicolaou smear of cervix   2. Screening for colorectal cancer   3. Dysmenorrhea   4. Enlarged uterus       Plan: Rx phenergan 25 mg #30 take 1 every 6 hours prn nausea, with 1 refill Return in 1 week for GYN [place] Mammogram yearly Physical in 1 year, pap in 3 if normal

## 2017-02-01 LAB — CYTOLOGY - PAP
Chlamydia: NEGATIVE
Diagnosis: NEGATIVE
HPV: NOT DETECTED
Neisseria Gonorrhea: NEGATIVE

## 2017-02-04 ENCOUNTER — Ambulatory Visit (INDEPENDENT_AMBULATORY_CARE_PROVIDER_SITE_OTHER): Payer: BLUE CROSS/BLUE SHIELD

## 2017-02-04 DIAGNOSIS — N852 Hypertrophy of uterus: Secondary | ICD-10-CM | POA: Diagnosis not present

## 2017-02-04 DIAGNOSIS — N946 Dysmenorrhea, unspecified: Secondary | ICD-10-CM | POA: Diagnosis not present

## 2017-02-04 NOTE — Progress Notes (Signed)
PELVIC US TA/TV: heterogeneous anteverted uterus w/a post submucosal fibroid distorting endometrium 2.5 x 2.5 x 2.6 cm,EEC 2.5 mm,normal ovaries bilat,no free fluid,ovaries appear mobile

## 2017-02-07 ENCOUNTER — Telehealth: Payer: Self-pay | Admitting: Adult Health

## 2017-02-07 NOTE — Telephone Encounter (Signed)
Left message to call me about US °

## 2017-02-09 ENCOUNTER — Telehealth: Payer: Self-pay | Admitting: *Deleted

## 2017-02-09 NOTE — Telephone Encounter (Signed)
Pt aware showed fibroids, declines OC, wants to discuss hysterectomy as option, appt made with Dr Glo Herring.

## 2017-02-09 NOTE — Telephone Encounter (Signed)
Pt called requesting results. I informed pt of PAP results. She stated she also wanted results from ultrasound. I advised pt that I would have Anderson Malta give her a call to discuss those. Pt verbalized understanding.

## 2017-02-21 ENCOUNTER — Encounter: Payer: Self-pay | Admitting: Obstetrics and Gynecology

## 2017-02-21 ENCOUNTER — Ambulatory Visit (INDEPENDENT_AMBULATORY_CARE_PROVIDER_SITE_OTHER): Payer: BLUE CROSS/BLUE SHIELD | Admitting: Obstetrics and Gynecology

## 2017-02-21 VITALS — BP 122/78 | HR 84 | Wt 260.2 lb

## 2017-02-21 DIAGNOSIS — N941 Unspecified dyspareunia: Secondary | ICD-10-CM | POA: Diagnosis not present

## 2017-02-21 DIAGNOSIS — R635 Abnormal weight gain: Secondary | ICD-10-CM

## 2017-02-21 DIAGNOSIS — I10 Essential (primary) hypertension: Secondary | ICD-10-CM

## 2017-02-21 DIAGNOSIS — N946 Dysmenorrhea, unspecified: Secondary | ICD-10-CM | POA: Diagnosis not present

## 2017-02-21 DIAGNOSIS — D259 Leiomyoma of uterus, unspecified: Secondary | ICD-10-CM

## 2017-02-21 NOTE — Progress Notes (Signed)
[facility] Visit  02/21/2017            Patient name: Susan [name] MRN [id number]  Date of birth: 18-Feb-1971  CC & HPI:  Susan [name] is a 46 y.o. female presenting today for f/u for U/S results. She reports back pain, lower abdominal cramping beginning 1 week before her menstrual period. She states due to an endometrial ablation she only spots during periods but states the cramping and back pain continues through the week of her period as well as the week after. She states she has not had these types of painful periods until recently. Pt also reports dyspareunia, and describes the feeling as pressure.  Pt had a pelvic U/S for her dysmenorrhea on 02/04/17, with results showing uterine fibroid. Pt wants to discuss hysterectomy as an option, per previous note.    Patient's last menstrual period was 02/13/2017.  ROS:  ROS +RLQ pressure +lower abdominal cramping  +back pain -bleeding   Pertinent History Reviewed:   Reviewed: Significant for Herpes simplex infection, ovarian cyst, D&C of uterus, endometrial ablation, tubal ligation  Medical         Past Medical History:  Diagnosis Date  . Anxiety   . Herpes simplex without mention of complication   . History of herpes simplex infection 12/18/2013  . Hypertension   . Ovarian cyst   . Reflux                               Surgical Hx:    Past Surgical History:  Procedure Laterality Date  . DILATION AND CURETTAGE OF UTERUS    . ENDOMETRIAL ABLATION    . TUBAL LIGATION  2006   Medications: Reviewed & Updated - see associated section                       Current Outpatient Prescriptions:  .  buPROPion (WELLBUTRIN XL) 150 MG 24 hr tablet, TAKE 1 TABLET BY MOUTH EVERY 24 HOURS, Disp: , Rfl: 2 .  Chlorpheniramine-APAP (CORICIDIN) 2-325 MG TABS, Take 1-2 tablets by mouth every 4 (four) hours as needed (for cold symptoms)., Disp: , Rfl:  .  DEXILANT 60 MG capsule, Take 60 mg by mouth daily. , Disp: , Rfl: 0 .  fluticasone  (FLONASE) 50 MCG/ACT nasal spray, Place 2 sprays into both nostrils daily. , Disp: , Rfl:  .  hydrochlorothiazide (MICROZIDE) 12.5 MG capsule, Take by mouth daily., Disp: , Rfl: 2 .  ibuprofen (ADVIL,MOTRIN) 800 MG tablet, Take 1 tablet (800 mg total) by mouth 3 (three) times daily., Disp: 21 tablet, Rfl: 0 .  lisinopril (PRINIVIL,ZESTRIL) 10 MG tablet, Take 10 mg by mouth daily., Disp: , Rfl: 2 .  metFORMIN (GLUCOPHAGE) 1000 MG tablet, Take 500 mg by mouth 2 (two) times daily. , Disp: , Rfl: 3 .  metoprolol (LOPRESSOR) 25 MG tablet, Take 1 tablet (25 mg total) by mouth 2 (two) times daily., Disp: 30 tablet, Rfl: 0 .  promethazine (PHENERGAN) 25 MG tablet, Take 1 tablet (25 mg total) by mouth every 6 (six) hours as needed for nausea or vomiting., Disp: 30 tablet, Rfl: 1 .  rosuvastatin (CRESTOR) 10 MG tablet, Take 10 mg by mouth daily., Disp: , Rfl:  .  valACYclovir (VALTREX) 1000 MG tablet, TAKE 1 TABLET BY MOUTH TWICE DAILY, Disp: 60 tablet, Rfl: 3   Social History: Reviewed -  reports that  she has never smoked. She has never used smokeless tobacco.  Objective Findings:  Vitals: There were no vitals taken for this visit.  Physical Examination: General appearance - alert, well appearing, and in no distress Mental status - alert, oriented to person, place, and time Pelvic - Discussion only  Ultrasound: Left ovary has a small cyst Small uterine fibroids    Assessment & Plan:   A:  1. Pelvic pressure 2. Uterine fibroids  P:  1. Follow up for repeat pelvic exam during premenstrual week, roughly in 2.5 weeks. Patient advised that if not in pain the day prior to next appointment she is to postpone until she experiences discomfort.   By signing my name below, I, [name], attest that this documentation has been prepared under the direction and in the presence of Jonnie Kind, MD. Electronically Signed: Jabier Gauss, ED Scribe. 02/21/17. 2:16 PM.  I personally performed the services  described in this documentation, which was SCRIBED in my presence. The recorded information has been reviewed and considered accurate. It has been edited as necessary during review. Jonnie Kind, MD

## 2017-03-10 ENCOUNTER — Encounter: Payer: Self-pay | Admitting: Obstetrics and Gynecology

## 2017-03-10 ENCOUNTER — Ambulatory Visit (INDEPENDENT_AMBULATORY_CARE_PROVIDER_SITE_OTHER): Payer: BLUE CROSS/BLUE SHIELD | Admitting: Obstetrics and Gynecology

## 2017-03-10 VITALS — BP 124/82 | HR 92 | Wt 262.0 lb

## 2017-03-10 DIAGNOSIS — N946 Dysmenorrhea, unspecified: Secondary | ICD-10-CM | POA: Diagnosis not present

## 2017-03-10 DIAGNOSIS — D259 Leiomyoma of uterus, unspecified: Secondary | ICD-10-CM | POA: Diagnosis not present

## 2017-03-10 DIAGNOSIS — Z8742 Personal history of other diseases of the female genital tract: Secondary | ICD-10-CM | POA: Insufficient documentation

## 2017-03-10 MED ORDER — MEGESTROL ACETATE 40 MG PO TABS: 40.0000 mg | ORAL_TABLET | Freq: Every day | ORAL | 2 refills | Status: DC

## 2017-03-10 NOTE — Progress Notes (Signed)
[facility] Visit  03/10/2017            Patient name: Susan [name] MRN [id number]  Date of birth: [birth year]  CC & HPI:  Susan [name] is a 46 y.o. female following up today for pelvic pain and fibroids to have a pelvic exam. She was seen in office on 02/21/17 for back pain, lower abdominal cramping that begins 1 week before menstrual period, and continues through up to 1 week after period. She says the pain occurs occasionally with sexual intercourse. Pt says the pain increases from mild to severe every time it occurs. No alleviating factors noted. Pt had pelvic U/S on 02/04/17 with uterine fibroid found.   ROS:  ROS  +lower abdominal cramping +back pain -fever -occasional dyspareunia   Pertinent History Reviewed:   Reviewed: Significant for uterine fibroids, ovarian cyst, tubal ligation Medical         Past Medical History:  Diagnosis Date  . Anxiety   . Herpes simplex without mention of complication   . History of herpes simplex infection 12/18/2013  . Hypertension   . Ovarian cyst   . Reflux                               Surgical Hx:    Past Surgical History:  Procedure Laterality Date  . DILATION AND CURETTAGE OF UTERUS    . ENDOMETRIAL ABLATION    . TUBAL LIGATION  2006   Medications: Reviewed & Updated - see associated section                       Current Outpatient Prescriptions:  .  buPROPion (WELLBUTRIN XL) 150 MG 24 hr tablet, TAKE 1 TABLET BY MOUTH EVERY 24 HOURS, Disp: , Rfl: 2 .  Chlorpheniramine-APAP (CORICIDIN) 2-325 MG TABS, Take 1-2 tablets by mouth every 4 (four) hours as needed (for cold symptoms)., Disp: , Rfl:  .  DEXILANT 60 MG capsule, Take 60 mg by mouth daily. , Disp: , Rfl: 0 .  fluticasone (FLONASE) 50 MCG/ACT nasal spray, Place 2 sprays into both nostrils daily. , Disp: , Rfl:  .  hydrochlorothiazide (MICROZIDE) 12.5 MG capsule, Take by mouth daily., Disp: , Rfl: 2 .  ibuprofen (ADVIL,MOTRIN) 800 MG tablet, Take 1 tablet (800 mg  total) by mouth 3 (three) times daily., Disp: 21 tablet, Rfl: 0 .  lisinopril (PRINIVIL,ZESTRIL) 10 MG tablet, Take 10 mg by mouth daily., Disp: , Rfl: 2 .  metFORMIN (GLUCOPHAGE) 1000 MG tablet, Take 500 mg by mouth 2 (two) times daily. , Disp: , Rfl: 3 .  metoprolol (LOPRESSOR) 25 MG tablet, Take 1 tablet (25 mg total) by mouth 2 (two) times daily., Disp: 30 tablet, Rfl: 0 .  promethazine (PHENERGAN) 25 MG tablet, Take 1 tablet (25 mg total) by mouth every 6 (six) hours as needed for nausea or vomiting., Disp: 30 tablet, Rfl: 1 .  rosuvastatin (CRESTOR) 10 MG tablet, Take 10 mg by mouth daily., Disp: , Rfl:  .  valACYclovir (VALTREX) 1000 MG tablet, TAKE 1 TABLET BY MOUTH TWICE DAILY, Disp: 60 tablet, Rfl: 3   Social History: Reviewed -  reports that she has never smoked. She has never used smokeless tobacco.  Objective Findings:  Vitals: Blood pressure 124/82, pulse 92, weight 262 lb (118.8 kg), last menstrual period 02/13/2017.  Physical Examination: General appearance - alert, well appearing, and in  no distress Mental status - alert, oriented to person, place, and time Pelvic -  VULVA: normal appearing vulva with no masses, tenderness or lesions,  VAGINA: normal appearing vagina with normal color and discharge, no lesions, mild pain on right side of vaginal wall CERVIX: normal appearing cervix without discharge or lesions, anteflexed UTERUS:unable to determine ADNEXA: normal adnexa in size, nontender and no masses   Discussion: Ultrasound of 02/04/17 was discussed with pt, as well as risks and benefits of starting Me  Assessment & Plan:   A:  1. Dysmenorrhea, s/p endometrial ablation 2. Uterine fibroids 3. Probable stenotic cervix  P:  1. Suppress endometrium with Megace 40 mg 1x a day until periods stop. Start when next period begins 2. F/u in 6-8 weeks     By signing my name below, I, Susan [name], attest that this documentation has been prepared under the direction and  in the presence of Jonnie Kind, MD. Electronically Signed: Jabier Gauss, ED Scribe. 03/10/17. 3:45 PM.  I personally performed the services described in this documentation, which was SCRIBED in my presence. The recorded information has been reviewed and considered accurate. It has been edited as necessary during review. Jonnie Kind, MD

## 2017-04-20 ENCOUNTER — Ambulatory Visit: Payer: BLUE CROSS/BLUE SHIELD | Admitting: Obstetrics and Gynecology

## 2017-05-09 ENCOUNTER — Other Ambulatory Visit: Payer: Self-pay | Admitting: Adult Health

## 2017-06-01 ENCOUNTER — Other Ambulatory Visit: Payer: Self-pay | Admitting: Adult Health

## 2017-06-10 DIAGNOSIS — M545 Low back pain: Secondary | ICD-10-CM | POA: Diagnosis not present

## 2017-06-10 DIAGNOSIS — M9903 Segmental and somatic dysfunction of lumbar region: Secondary | ICD-10-CM | POA: Diagnosis not present

## 2017-06-10 DIAGNOSIS — M9902 Segmental and somatic dysfunction of thoracic region: Secondary | ICD-10-CM | POA: Diagnosis not present

## 2017-06-10 DIAGNOSIS — M9905 Segmental and somatic dysfunction of pelvic region: Secondary | ICD-10-CM | POA: Diagnosis not present

## 2017-06-23 DIAGNOSIS — M545 Low back pain: Secondary | ICD-10-CM | POA: Diagnosis not present

## 2017-06-23 DIAGNOSIS — M9903 Segmental and somatic dysfunction of lumbar region: Secondary | ICD-10-CM | POA: Diagnosis not present

## 2017-06-23 DIAGNOSIS — M9902 Segmental and somatic dysfunction of thoracic region: Secondary | ICD-10-CM | POA: Diagnosis not present

## 2017-06-23 DIAGNOSIS — M9905 Segmental and somatic dysfunction of pelvic region: Secondary | ICD-10-CM | POA: Diagnosis not present

## 2017-06-28 DIAGNOSIS — M9905 Segmental and somatic dysfunction of pelvic region: Secondary | ICD-10-CM | POA: Diagnosis not present

## 2017-06-28 DIAGNOSIS — M545 Low back pain: Secondary | ICD-10-CM | POA: Diagnosis not present

## 2017-06-28 DIAGNOSIS — M9903 Segmental and somatic dysfunction of lumbar region: Secondary | ICD-10-CM | POA: Diagnosis not present

## 2017-06-28 DIAGNOSIS — M9902 Segmental and somatic dysfunction of thoracic region: Secondary | ICD-10-CM | POA: Diagnosis not present

## 2017-08-10 ENCOUNTER — Encounter (HOSPITAL_COMMUNITY): Payer: Self-pay | Admitting: Emergency Medicine

## 2017-08-10 ENCOUNTER — Emergency Department (HOSPITAL_COMMUNITY): Payer: BLUE CROSS/BLUE SHIELD

## 2017-08-10 ENCOUNTER — Observation Stay (HOSPITAL_COMMUNITY)
Admission: EM | Admit: 2017-08-10 | Discharge: 2017-08-12 | Disposition: A | Discharge status: Home / Self Care | Payer: BLUE CROSS/BLUE SHIELD | Attending: Family Medicine | Admitting: Family Medicine

## 2017-08-10 ENCOUNTER — Other Ambulatory Visit (HOSPITAL_COMMUNITY): Payer: BLUE CROSS/BLUE SHIELD

## 2017-08-10 ENCOUNTER — Observation Stay (HOSPITAL_COMMUNITY): Payer: BLUE CROSS/BLUE SHIELD

## 2017-08-10 DIAGNOSIS — K219 Gastro-esophageal reflux disease without esophagitis: Secondary | ICD-10-CM | POA: Diagnosis not present

## 2017-08-10 DIAGNOSIS — I10 Essential (primary) hypertension: Secondary | ICD-10-CM | POA: Diagnosis not present

## 2017-08-10 DIAGNOSIS — R0789 Other chest pain: Secondary | ICD-10-CM | POA: Diagnosis not present

## 2017-08-10 DIAGNOSIS — R0609 Other forms of dyspnea: Secondary | ICD-10-CM | POA: Diagnosis not present

## 2017-08-10 DIAGNOSIS — E785 Hyperlipidemia, unspecified: Secondary | ICD-10-CM | POA: Diagnosis not present

## 2017-08-10 DIAGNOSIS — E119 Type 2 diabetes mellitus without complications: Secondary | ICD-10-CM | POA: Insufficient documentation

## 2017-08-10 DIAGNOSIS — Z7984 Long term (current) use of oral hypoglycemic drugs: Secondary | ICD-10-CM | POA: Diagnosis not present

## 2017-08-10 DIAGNOSIS — R011 Cardiac murmur, unspecified: Secondary | ICD-10-CM | POA: Diagnosis not present

## 2017-08-10 DIAGNOSIS — R609 Edema, unspecified: Secondary | ICD-10-CM

## 2017-08-10 DIAGNOSIS — M7989 Other specified soft tissue disorders: Secondary | ICD-10-CM | POA: Diagnosis not present

## 2017-08-10 DIAGNOSIS — Z79899 Other long term (current) drug therapy: Secondary | ICD-10-CM | POA: Insufficient documentation

## 2017-08-10 DIAGNOSIS — E1169 Type 2 diabetes mellitus with other specified complication: Secondary | ICD-10-CM

## 2017-08-10 DIAGNOSIS — R079 Chest pain, unspecified: Secondary | ICD-10-CM | POA: Diagnosis present

## 2017-08-10 HISTORY — DX: Edema, unspecified: R60.9

## 2017-08-10 HISTORY — DX: Hyperlipidemia, unspecified: E78.5

## 2017-08-10 HISTORY — DX: Cardiac murmur, unspecified: R01.1

## 2017-08-10 HISTORY — DX: Type 2 diabetes mellitus without complications: E11.9

## 2017-08-10 LAB — COMPREHENSIVE METABOLIC PANEL
ALT: 19 U/L (ref 14–54)
AST: 18 U/L (ref 15–41)
Albumin: 3.5 g/dL (ref 3.5–5.0)
Alkaline Phosphatase: 86 U/L (ref 38–126)
Anion gap: 11 (ref 5–15)
BUN: 15 mg/dL (ref 6–20)
CHLORIDE: 102 mmol/L (ref 101–111)
CO2: 25 mmol/L (ref 22–32)
CREATININE: 0.98 mg/dL (ref 0.44–1.00)
Calcium: 8.9 mg/dL (ref 8.9–10.3)
GFR calc non Af Amer: >60 mL/min (ref >60)
Glucose, Bld: 108 mg/dL — ABNORMAL HIGH (ref 65–99)
POTASSIUM: 4.1 mmol/L (ref 3.5–5.1)
SODIUM: 138 mmol/L (ref 135–145)
Total Bilirubin: 0.1 mg/dL — ABNORMAL LOW (ref 0.3–1.2)
Total Protein: 7.3 g/dL (ref 6.5–8.1)

## 2017-08-10 LAB — CBC WITH DIFFERENTIAL/PLATELET
BASOS ABS: 0 10*3/uL (ref 0.0–0.1)
Basophils Relative: 1 %
EOS ABS: 0.4 10*3/uL (ref 0.0–0.7)
EOS PCT: 4 %
HCT: 37.1 % (ref 36.0–46.0)
Hemoglobin: 11.6 g/dL — ABNORMAL LOW (ref 12.0–15.0)
Lymphocytes Relative: 30 %
Lymphs Abs: 2.7 10*3/uL (ref 0.7–4.0)
MCH: 26.6 pg (ref 26.0–34.0)
MCHC: 31.3 g/dL (ref 30.0–36.0)
MCV: 85.1 fL (ref 78.0–100.0)
MONO ABS: 0.6 10*3/uL (ref 0.1–1.0)
Monocytes Relative: 6 %
Neutro Abs: 5.3 10*3/uL (ref 1.7–7.7)
Neutrophils Relative %: 59 %
PLATELETS: 276 10*3/uL (ref 150–400)
RBC: 4.36 MIL/uL (ref 3.87–5.11)
RDW: 17.4 % — AB (ref 11.5–15.5)
WBC: 8.9 10*3/uL (ref 4.0–10.5)

## 2017-08-10 LAB — LIPID PANEL
CHOL/HDL RATIO: 3.3 ratio
Cholesterol: 138 mg/dL (ref 0–200)
HDL: 42 mg/dL (ref >40)
LDL CALC: 80 mg/dL (ref 0–99)
TRIGLYCERIDES: 80 mg/dL (ref <150)
VLDL: 16 mg/dL (ref 0–40)

## 2017-08-10 LAB — D-DIMER, QUANTITATIVE: D-Dimer, Quant: 0.29 ug/mL-FEU (ref 0.00–0.50)

## 2017-08-10 LAB — HEMOGLOBIN A1C
Hgb A1c MFr Bld: 6.9 % — ABNORMAL HIGH (ref 4.8–5.6)
Mean Plasma Glucose: 151.33 mg/dL

## 2017-08-10 LAB — TROPONIN I: Troponin I: <0.03 ng/mL (ref <0.03)

## 2017-08-10 LAB — GLUCOSE, CAPILLARY
GLUCOSE-CAPILLARY: 108 mg/dL — AB (ref 65–99)
Glucose-Capillary: 148 mg/dL — ABNORMAL HIGH (ref 65–99)

## 2017-08-10 LAB — BRAIN NATRIURETIC PEPTIDE: B Natriuretic Peptide: 17 pg/mL (ref 0.0–100.0)

## 2017-08-10 LAB — TSH: TSH: 1.53 u[IU]/mL (ref 0.350–4.500)

## 2017-08-10 MED ORDER — INSULIN ASPART 100 UNIT/ML ~~LOC~~ SOLN
0.0000 [IU] | Freq: Three times a day (TID) | SUBCUTANEOUS | Status: DC
  Administered 2017-08-11 – 2017-08-12 (×2): 1 [IU] via SUBCUTANEOUS

## 2017-08-10 MED ORDER — ACETAMINOPHEN 500 MG PO TABS
1000.0000 mg | ORAL_TABLET | Freq: Four times a day (QID) | ORAL | Status: DC | PRN
  Administered 2017-08-10 – 2017-08-11 (×2): 1000 mg via ORAL
  Filled 2017-08-10 (×2): qty 2

## 2017-08-10 MED ORDER — ASPIRIN 81 MG PO CHEW
324.0000 mg | CHEWABLE_TABLET | Freq: Once | ORAL | Status: AC
  Administered 2017-08-10: 324 mg via ORAL
  Filled 2017-08-10: qty 4

## 2017-08-10 MED ORDER — LISINOPRIL 10 MG PO TABS
10.0000 mg | ORAL_TABLET | Freq: Every day | ORAL | Status: DC
  Administered 2017-08-10 – 2017-08-12 (×3): 10 mg via ORAL
  Filled 2017-08-10 (×3): qty 1

## 2017-08-10 MED ORDER — ASPIRIN EC 81 MG PO TBEC
81.0000 mg | DELAYED_RELEASE_TABLET | Freq: Every day | ORAL | Status: DC
  Administered 2017-08-11 – 2017-08-12 (×2): 81 mg via ORAL
  Filled 2017-08-10 (×2): qty 1

## 2017-08-10 MED ORDER — INSULIN ASPART 100 UNIT/ML ~~LOC~~ SOLN: 0.0000 [IU] | Freq: Every day | SUBCUTANEOUS | Status: DC

## 2017-08-10 MED ORDER — ENOXAPARIN SODIUM 80 MG/0.8ML ~~LOC~~ SOLN
70.0000 mg | SUBCUTANEOUS | Status: DC
  Administered 2017-08-10 – 2017-08-11 (×2): 70 mg via SUBCUTANEOUS
  Filled 2017-08-10 (×2): qty 0.8

## 2017-08-10 MED ORDER — ROSUVASTATIN CALCIUM 10 MG PO TABS
10.0000 mg | ORAL_TABLET | Freq: Every day | ORAL | Status: DC
  Administered 2017-08-10 – 2017-08-12 (×3): 10 mg via ORAL
  Filled 2017-08-10 (×3): qty 1

## 2017-08-10 MED ORDER — HYDROCHLOROTHIAZIDE 12.5 MG PO CAPS
12.5000 mg | ORAL_CAPSULE | Freq: Every day | ORAL | Status: DC
  Administered 2017-08-10 – 2017-08-12 (×3): 12.5 mg via ORAL
  Filled 2017-08-10 (×3): qty 1

## 2017-08-10 MED ORDER — MONTELUKAST SODIUM 10 MG PO TABS
10.0000 mg | ORAL_TABLET | Freq: Every day | ORAL | Status: DC
  Administered 2017-08-10 – 2017-08-11 (×2): 10 mg via ORAL
  Filled 2017-08-10 (×2): qty 1

## 2017-08-10 NOTE — ED Triage Notes (Signed)
Patient complaining of left sided chest pain radiating down left arm and shortness of breath with exertion x 2 weeks. States she saw PCP 2 weeks ago and was told "I may have asthma." States she worked all night and is having chest pain at this time.

## 2017-08-10 NOTE — H&P (Signed)
History and Physical    Susan [name] SWN:[id number] DOB: Mar 09, 1971 DOA: 08/10/2017  PCP: System, Pcp Not In  Patient coming from: home  I have personally briefly reviewed patient's old medical records in [place]  Chief Complaint: chest pain  HPI: Susan [name] is Susan [name] 46 y.o. female with medical history significant of HTN, T2DM, HLD presenting with chest pain and shortness of breath.    She notes her symptoms started about 3 weeks ago.  She notes chest tightness and shortness of breath with exertion.  On December 10 her physician prescribed albuterol with the assumption that she was having symptoms consistent with asthma.  Her symptoms have not improved with albuterol and have been progressively getting worse.  She notes her chest tightness is worse with movement.  Worse when she walks upstairs.  She notes by the time she walks from her place of work to her car she feels like she is wheezing.  The chest pain feels like tightness and last minutes or longer when she walks up steps.  She is noticed some radiation down her left arm as well.  She notes that she cannot lay flat and sleeps on 4 pillows.  She wakes up short of breath at night as well.  She is noticed swelling in her legs as well.  No fevers, chills, abdominal pain, sweating, nausea, vomiting.  She notes that her lower extremity edema has been present for months.  Family history is notable for stroke in her dad and this is 64s.  No history of heart attacks that she knows about.  She denies smoking and drinks occasionally.  ED Course: In the ED, received ASA, EKG, labs.  Admit for CP r/o with plan for stress.   Review of Systems: As per HPI otherwise 10 point review of systems negative.   Past Medical History:  Diagnosis Date  . Anxiety   . Diabetes mellitus ([place])   . Fluid retention   . Heart murmur   . Herpes simplex without mention of complication   . History of herpes simplex infection 12/18/2013  . Hyperlipidemia   .  Hypertension   . Ovarian cyst   . Reflux     Past Surgical History:  Procedure Laterality Date  . DILATION AND CURETTAGE OF UTERUS    . ENDOMETRIAL ABLATION    . TUBAL LIGATION  2006     reports that  has never smoked. she has never used smokeless tobacco. She reports that she drinks alcohol. She reports that she does not use drugs.  No Known Allergies  Family History  Problem Relation Age of Onset  . Hypertension Maternal Grandmother   . Breast cancer Other   . Colon cancer Maternal Grandfather    Prior to Admission medications   Medication Sig Start Date End Date Taking? Authorizing Provider  albuterol (PROVENTIL HFA;VENTOLIN HFA) 108 (90 Base) MCG/ACT inhaler Inhale 1-2 puffs into the lungs every 6 (six) hours as needed for wheezing or shortness of breath.   Yes [provider]  DEXILANT 60 MG capsule Take 60 mg by mouth daily.  06/20/14  Yes [provider]  fluticasone (FLONASE) 50 MCG/ACT nasal spray Place 2 sprays into both nostrils daily.  12/27/14  Yes [provider]  hydrochlorothiazide (MICROZIDE) 12.5 MG capsule Take by mouth daily. 12/24/16  Yes [provider]  lisinopril (PRINIVIL,ZESTRIL) 10 MG tablet Take 10 mg by mouth daily. 06/20/14  Yes [provider]  metFORMIN (GLUCOPHAGE) 1000 MG tablet  Take 500 mg by mouth 2 (two) times daily.  06/17/16  Yes [provider]  metoprolol (LOPRESSOR) 25 MG tablet Take 1 tablet (25 mg total) by mouth 2 (two) times daily. 07/15/14  Yes Idol, Almyra Free, PA-C  montelukast (SINGULAIR) 10 MG tablet Take 10 mg by mouth at bedtime.   Yes [provider]  promethazine (PHENERGAN) 25 MG tablet TAKE 1 TABLET(25 MG) BY MOUTH EVERY 6 HOURS AS NEEDED FOR NAUSEA OR VOMITING 06/01/17  Yes Derrek Monaco Lathaniel Legate, NP  rosuvastatin (CRESTOR) 10 MG tablet Take 10 mg by mouth daily.   Yes [provider]  valACYclovir (VALTREX) 1000 MG tablet TAKE 1 TABLET BY MOUTH TWICE DAILY 05/09/17   Yes Estill Dooms, NP    Physical Exam: Vitals:   08/10/17 1400 08/10/17 1430 08/10/17 1500 08/10/17 1558  BP: 111/63 129/67 135/80 (!) 128/48  Pulse: 81 85 84 89  Resp: (!) 21 19 20 20   Temp:    98.8 F (37.1 C)  TempSrc:    Oral  SpO2: 99% 98% 100% 98%  Weight:    (!) 143.2 kg (315 lb 11.2 oz)  Height:    5' (1.524 m)    Constitutional: NAD, calm, comfortable Vitals:   08/10/17 1400 08/10/17 1430 08/10/17 1500 08/10/17 1558  BP: 111/63 129/67 135/80 (!) 128/48  Pulse: 81 85 84 89  Resp: (!) 21 19 20 20   Temp:    98.8 F (37.1 C)  TempSrc:    Oral  SpO2: 99% 98% 100% 98%  Weight:    (!) 143.2 kg (315 lb 11.2 oz)  Height:    5' (1.524 m)   Eyes: PERRL, lids and conjunctivae normal ENMT: Mucous membranes are moist. Posterior pharynx clear of any exudate or lesions.Normal dentition.  Neck: normal, supple, no masses, no thyromegaly Respiratory: clear to auscultation bilaterally, no wheezing, no crackles. Normal respiratory effort. No accessory muscle use.  Cardiovascular: Regular rate and rhythm, no murmurs / rubs / gallops. R>L 1+ LEE. 2+ pedal pulses. No carotid bruits.  Abdomen: no tenderness, no masses palpated. No hepatosplenomegaly. Bowel sounds positive.  Musculoskeletal: no clubbing / cyanosis. No joint deformity upper and lower extremities. Good ROM, no contractures. Normal muscle tone.  Skin: no rashes, lesions, ulcers. No induration Neurologic: CN 2-12 grossly intact. Sensation intact. Strength 5/5 in all 4.  Psychiatric: Normal judgment and insight. Alert and oriented x 3. Normal mood.   Labs on Admission: I have personally reviewed following labs and imaging studies  CBC: Recent Labs  Lab 08/10/17 0921  WBC 8.9  NEUTROABS 5.3  HGB 11.6*  HCT 37.1  MCV 85.1  PLT 379   Basic Metabolic Panel: Recent Labs  Lab 08/10/17 0921  NA 138  K 4.1  CL 102  CO2 25  GLUCOSE 108*  BUN 15  CREATININE 0.98  CALCIUM 8.9   GFR: Estimated Creatinine  Clearance: 97.8 mL/min (by C-G formula based on SCr of 0.98 mg/dL). Liver Function Tests: Recent Labs  Lab 08/10/17 0921  AST 18  ALT 19  ALKPHOS 86  BILITOT 0.1*  PROT 7.3  ALBUMIN 3.5   No results for input(s): LIPASE, AMYLASE in the last 168 hours. No results for input(s): AMMONIA in the last 168 hours. Coagulation Profile: No results for input(s): INR, PROTIME in the last 168 hours. Cardiac Enzymes: Recent Labs  Lab 08/10/17 0921 08/10/17 1314  TROPONINI <0.03 <0.03   BNP (last 3 results) No results for input(s): PROBNP in the last 8760 hours.  HbA1C: No results for input(s): HGBA1C in the last 72 hours. CBG: No results for input(s): GLUCAP in the last 168 hours. Lipid Profile: No results for input(s): CHOL, HDL, LDLCALC, TRIG, CHOLHDL, LDLDIRECT in the last 72 hours. Thyroid Function Tests: No results for input(s): TSH, T4TOTAL, FREET4, T3FREE, THYROIDAB in the last 72 hours. Anemia Panel: No results for input(s): VITAMINB12, FOLATE, FERRITIN, TIBC, IRON, RETICCTPCT in the last 72 hours. Urine analysis: No results found for: COLORURINE, APPEARANCEUR, LABSPEC, PHURINE, GLUCOSEU, HGBUR, BILIRUBINUR, KETONESUR, PROTEINUR, UROBILINOGEN, NITRITE, LEUKOCYTESUR  Radiological Exams on Admission: Dg Chest 2 View  Result Date: 08/10/2017 CLINICAL DATA:  Chest pain radiating down left arm. Shortness of breath . EXAM: CHEST  2 VIEW COMPARISON:  03/26/2015 . FINDINGS: Mediastinum hilar structures normal. Mild cardiomegaly. No pulmonary venous congestion. Mild left mid lung subsegmental atelectasis. No pleural effusion or pneumothorax. IMPRESSION: 1. Mild cardiomegaly.  No pulmonary venous congestion. 2. Mild left mid lung subsegmental atelectasis. No acute infiltrate noted. Electronically Signed   By: Marcello Moores  Register   On: 08/10/2017 10:15   US Venous Img Lower Bilateral  Result Date: 08/10/2017 CLINICAL DATA:  46 year old female with bilateral lower extremity swelling. EXAM:  BILATERAL LOWER EXTREMITY VENOUS DOPPLER ULTRASOUND TECHNIQUE: Gray-scale sonography with graded compression, as well as color Doppler and duplex ultrasound were performed to evaluate the lower extremity deep venous systems from the level of the common femoral vein and including the common femoral, femoral, profunda femoral, popliteal and calf veins including the posterior tibial, peroneal and gastrocnemius veins when visible. The superficial great saphenous vein was also interrogated. Spectral Doppler was utilized to evaluate flow at rest and with distal augmentation maneuvers in the common femoral, femoral and popliteal veins. COMPARISON:  None. FINDINGS: RIGHT LOWER EXTREMITY Common Femoral Vein: No evidence of thrombus. Normal compressibility, respiratory phasicity and response to augmentation. Saphenofemoral Junction: No evidence of thrombus. Normal compressibility and flow on color Doppler imaging. Profunda Femoral Vein: No evidence of thrombus. Normal compressibility and flow on color Doppler imaging. Femoral Vein: No evidence of thrombus. Normal compressibility, respiratory phasicity and response to augmentation. Popliteal Vein: No evidence of thrombus. Normal compressibility, respiratory phasicity and response to augmentation. Calf Veins: No evidence of thrombus. Normal compressibility and flow on color Doppler imaging. Superficial Great Saphenous Vein: No evidence of thrombus. Normal compressibility. Venous Reflux:  None. Other Findings:  None. LEFT LOWER EXTREMITY Common Femoral Vein: No evidence of thrombus. Normal compressibility, respiratory phasicity and response to augmentation. Saphenofemoral Junction: No evidence of thrombus. Normal compressibility and flow on color Doppler imaging. Profunda Femoral Vein: No evidence of thrombus. Normal compressibility and flow on color Doppler imaging. Femoral Vein: No evidence of thrombus. Normal compressibility, respiratory phasicity and response to  augmentation. Popliteal Vein: No evidence of thrombus. Normal compressibility, respiratory phasicity and response to augmentation. Calf Veins: No evidence of thrombus. Normal compressibility and flow on color Doppler imaging. Superficial Great Saphenous Vein: No evidence of thrombus. Normal compressibility. Venous Reflux:  None. Other Findings:  None. IMPRESSION: No evidence of deep venous thrombosis. Electronically Signed   By: Jacqulynn Cadet M.D.   On: 08/10/2017 15:32    EKG: Independently reviewed. NSR, no ST-T wave changes concerning for ischemia  Assessment/Plan Active Problems:   Chest pain  Chest Pain  Dyspnea on exertion:  Worse with exertion, better with rest.  Also some L arm radiation as well.  She also notes orthopnea and PND.  She has LEE as well.  Was prescribed albuterol which she's been using without  relief.  Ddx includes ACS, HF, PE, etc.  CXR with atelectasis.  Initial trop negative, BNP negative.  EKG with NSR.      Given typical nature of sx, cards c/s for stress test. Echo given orthopnea and PND AM EKG Trend trops Hold beta blocker given planned stress S/p ASA, will add this on daily A1c, lipids D dimer, also obtaining LE [place] as R>L LEE  T2DM  HLD:  F/u A1c.  Hold metformin.  SSI.  On statin, needs ASA daily F/u lipid panel  HTN: hold beta blocker with stress.  Continue HCTZ and lisinopril   Gerd: holding PPI  DVT prophylaxis: lovenox  Code Status: fulls  Family Communication: mother at bedside  Disposition Plan: pending workup  Consults called: cardiology c/s  Admission status: tele/obs    Fayrene Helper MD [facility] Pager [phone]  If 7PM-7AM, please contact night-coverage [email or web address] Password [email or web address]  08/10/2017, 4:24 PM

## 2017-08-10 NOTE — Consult Note (Signed)
Cardiology Consultation:   Patient ID: Susan [name]; [id number]; 09-28-1970   Admit date: 08/10/2017 Date of Consult: 08/10/2017  Primary Care Provider: System, Pcp Not In Primary Cardiologist: New to Dr. Harrington Challenger  Chief Complaint: chest tightness/SOB  Patient Profile:   Susan [name] is a 46 y.o. female with a hx of DM (x2 years, A1C 6.6), HTN, HLD, fluid retention who is being seen today for the evaluation of CP/SOB at the request of Dr. Dayna Barker. No family history of heart disease. Has h/o heart murmur with possible echo in the past per patient but findings unknown.  History of Present Illness:   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 For the past 3 weeks she's been noticing exertional chest tightness and dyspnea. She saw her PCP on 07/25/17 who felt she might have asthma and prescribed an inhaler. She was also prescribed a "fluid pill" for mild swelling which was attributed to standing on her feet all day. She did not notice any significant difference with these measures. She moved into an apartment on the 3rd floor and now whenever she gets up to the top she notices chest tightness and dyspnea which takes several minutes of rest before it goes away. She also notices wheezing with this. She has had rare episodes of fleeting discomfort at rest. Today the pain happened at work and took much longer to go away than usual, prompting her to seek care at the [facility] ED. No recent long travel, surgery, bed rest or prior clotting. VSS, CXR with mild cardiomegaly, troponin negative, Hgb 11.6. Currently pain free at rest.  Past Medical History:  Diagnosis Date  . Anxiety   . Diabetes mellitus ([place])   . Fluid retention   . Heart murmur   . Herpes simplex without mention of complication   . History of herpes simplex infection 12/18/2013  . Hyperlipidemia   . Hypertension   . Ovarian cyst   . Reflux     Past Surgical History:  Procedure Laterality Date  . DILATION AND  CURETTAGE OF UTERUS    . ENDOMETRIAL ABLATION    . TUBAL LIGATION  2006     Inpatient Medications: Scheduled Meds:  Continuous Infusions:  PRN Meds:   Home Meds: Prior to Admission medications   Medication Sig Start Date End Date Taking? Authorizing Provider  albuterol (PROVENTIL HFA;VENTOLIN HFA) 108 (90 Base) MCG/ACT inhaler Inhale 1-2 puffs into the lungs every 6 (six) hours as needed for wheezing or shortness of breath.   Yes [provider]  DEXILANT 60 MG capsule Take 60 mg by mouth daily.  06/20/14  Yes [provider]  fluticasone (FLONASE) 50 MCG/ACT nasal spray Place 2 sprays into both nostrils daily.  12/27/14  Yes [provider]  hydrochlorothiazide (MICROZIDE) 12.5 MG capsule Take by mouth daily. 12/24/16  Yes [provider]  lisinopril (PRINIVIL,ZESTRIL) 10 MG tablet Take 10 mg by mouth daily. 06/20/14  Yes [provider]  metFORMIN (GLUCOPHAGE) 1000 MG tablet Take 500 mg by mouth 2 (two) times daily.  06/17/16  Yes [provider]  metoprolol (LOPRESSOR) 25 MG tablet Take 1 tablet (25 mg total) by mouth 2 (two) times daily. 07/15/14  Yes Idol, Almyra Free, PA-C  montelukast (SINGULAIR) 10 MG tablet Take 10 mg by mouth at bedtime.   Yes [provider]  promethazine (PHENERGAN) 25 MG tablet TAKE 1 TABLET(25 MG) BY MOUTH EVERY 6 HOURS AS NEEDED FOR NAUSEA OR VOMITING 06/01/17  Yes Derrek Monaco A, NP  rosuvastatin (CRESTOR) 10 MG tablet Take 10 mg by mouth daily.   Yes [provider]  valACYclovir (VALTREX) 1000 MG tablet TAKE 1 TABLET BY MOUTH TWICE DAILY 05/09/17  Yes Estill Dooms, NP    Allergies:   No Known Allergies  Social History:   Social History   Socioeconomic History  . Marital status: Married    Spouse name: Not on file  . Number of children: Not on file  . Years of education: Not on file  . Highest education level: Not on file  Social Needs  . Financial resource strain: Not on  file  . Food insecurity - worry: Not on file  . Food insecurity - inability: Not on file  . Transportation needs - medical: Not on file  . Transportation needs - non-medical: Not on file  Occupational History  . Not on file  Tobacco Use  . Smoking status: Never Smoker  . Smokeless tobacco: Never Used  Substance and Sexual Activity  . Alcohol use: Yes    Comment: occ. 2 drinks/month  . Drug use: No  . Sexual activity: Yes    Birth control/protection: Surgical    Comment: btl and ablation  Other Topics Concern  . Not on file  Social History Narrative  . Not on file    Family History:   The patient's family history includes Breast cancer in her other; Colon cancer in her maternal grandfather; Hypertension in her maternal grandmother.  ROS:  Please see the history of present illness.  All other ROS reviewed and negative.     Physical Exam/Data:   Vitals:   08/10/17 0900 08/10/17 0930 08/10/17 1000 08/10/17 1030  BP: 135/70 112/71 (!) 106/50 (!) 104/49  Pulse: 75 78 (!) 102 72  Resp: 20 20  20   Temp:      TempSrc:      SpO2: 100% 100% 100% 100%  Weight:      Height:       No intake or output data in the 24 hours ending 08/10/17 1251 Filed Weights   08/10/17 0851  Weight: 270 lb (122.5 kg)   Body mass index is 52.73 kg/m.  General: Morbidly obese AAF, in no acute distress. Head: Normocephalic, atraumatic, sclera non-icteric, no xanthomas, nares are without discharge.  Neck: Negative for carotid bruits. JVD not elevated. Lungs: Clear bilaterally to auscultation without wheezes, rales, or rhonchi. Breathing is unlabored. Heart: RRR with S1 S2. Soft SEM RUSB. No rubs or gallops appreciated. Abdomen: Soft, non-tender, non-distended with normoactive bowel sounds. No hepatomegaly. No rebound/guarding. No obvious abdominal masses. Msk:  Strength and tone appear normal for age. Extremities: No clubbing or cyanosis. Trace sockline edema.  Distal pedal pulses are 2+ and equal  bilaterally. Neuro: Alert and oriented X 3. No facial asymmetry. No focal deficit. Moves all extremities spontaneously. Psych:  Responds to questions appropriately with a normal affect.  EKG:  The EKG was personally reviewed and demonstrates NSR 79bpm no acute ST-T changes  Laboratory Data:  Chemistry Recent Labs  Lab 08/10/17 0921  NA 138  K 4.1  CL 102  CO2 25  GLUCOSE 108*  BUN 15  CREATININE 0.98  CALCIUM 8.9  GFRNONAA >60  GFRAA >60  ANIONGAP 11    Recent Labs  Lab 08/10/17 0921  PROT 7.3  ALBUMIN 3.5  AST 18  ALT 19  ALKPHOS 86  BILITOT 0.1*   Hematology Recent Labs  Lab 08/10/17 0921  WBC  8.9  RBC 4.36  HGB 11.6*  HCT 37.1  MCV 85.1  MCH 26.6  MCHC 31.3  RDW 17.4*  PLT 276   Cardiac Enzymes Recent Labs  Lab 08/10/17 0921  TROPONINI <0.03   No results for input(s): TROPIPOC in the last 168 hours.  BNPNo results for input(s): BNP, PROBNP in the last 168 hours.  DDimer No results for input(s): DDIMER in the last 168 hours.  Radiology/Studies:  Dg Chest 2 View  Result Date: 08/10/2017 CLINICAL DATA:  Chest pain radiating down left arm. Shortness of breath . EXAM: CHEST  2 VIEW COMPARISON:  03/26/2015 . FINDINGS: Mediastinum hilar structures normal. Mild cardiomegaly. No pulmonary venous congestion. Mild left mid lung subsegmental atelectasis. No pleural effusion or pneumothorax. IMPRESSION: 1. Mild cardiomegaly.  No pulmonary venous congestion. 2. Mild left mid lung subsegmental atelectasis. No acute infiltrate noted. Electronically Signed   By: Marcello Moores  Register   On: 08/10/2017 10:15    Assessment and Plan:   1. Exertional chest tightness/dyspnea - nature of symptoms sounds suspicious for unstable angina. Cannot exclude valvular disease, PE, or reactive airway as cause for symptoms. Recommend internal medicine admission for rule out and further evaluation. Will add BNP and d-dimer to labs as well given recent reports of swelling coinciding with  symptoms. CXR with mild cardiomegaly. Check uPreg for completeness to allow for further testing. I will review further workup with Dr. Harrington Challenger.  2. HTN - currently controlled.  3. Hyperlipidemia - lipid status unknown in Epic. Continue statin. Consideration should be given by primary care to intensifying dose to at least moderate intensity since she has diabetes.  4. Cardiac murmur - recommend 2D echo be ordered upon admission.  For questions or updates, please contact [place] Please consult [email or web address] for contact info under Cardiology/STEMI.    Signed, Charlie Pitter, PA-C  08/10/2017 12:51 PM   Pt seen and examined  I agree with findings as noted by D Dunn above Pt is a 46 yo with no known cardiac problems  Has noted increased DOE over the past 2 months   Some wheezing  Seen by LMD  Given Rx of inhaler with no help  Does have some shoulder pain but this is more constant, worse with movement (hx of injury in past)  On exam, pt is an obese  JVP normal  Lungs  Moving air OK  No wheezes  Cardiac RRR  No S3  I-II/VI systolic at base  Ext with no signif edema   L shoulder with limited motin and discomfort on motion    EKG without acute changes  D Dimer negative   IMpression:  Dyspnea  Concerning  Worse with movement  Wheezing  No wheezing on exam Would get echo to eval diastolic function Set up for myovue  Exercise  myovue If normal results for above would recomm PFTs (pt had recent move, question allergic with broncospasm)    Dorris Carnes

## 2017-08-10 NOTE — Progress Notes (Signed)
Patient is an admit from the ED, to room 341. Placed on telemetry box 37, vital signs stable,no c/o pain or discomfort noted. Report received from ED nurse Bosie Clos RN. Will continue to monitor patient.

## 2017-08-10 NOTE — ED Provider Notes (Signed)
Emergency Department Provider Note   I have reviewed the triage vital signs and the nursing notes.   HISTORY  Chief Complaint Chest Pain   HPI Susan [name] is a 46 y.o. female who is obese with a past medical history of diabetes, high cholesterol, hypertension who presents with 2 weeks of progressively worsening chest tightness that radiates to her left arm associated with dyspnea on exertion.  Seems to improve with rest.  Initially it was when she was walking in from the parking lot now skin of the point where she cannot go upstairs to have been to take a break.  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  She is seen her doctor and they said that she might have asthma and allergies that started on Singulair and inhaler but this is not helped at all.  Her symptoms have continued to worsen.  No family history of heart disease at a young age nor does she have a history of smoking.  No history of similar symptoms.  She does have some cough with this where she will cough up saliva but no discolored mucus.  No fevers.  No new lower extremity swelling.  She is on fluid pills she says because she has swelling in her hands and legs after work sometimes.  No other associated or modifying symptoms.  Has not done anything else to help with the symptoms. Nursing note states she still has CP however on my examination she states she has no chest pain.   Past Medical History:  Diagnosis Date  . Anxiety   . Herpes simplex without mention of complication   . History of herpes simplex infection 12/18/2013  . Hypertension   . Ovarian cyst   . Reflux     Patient Active Problem List   Diagnosis Date Noted  . Personal history of dysmenorrhea 03/10/2017  . Chronic hypertension 02/21/2017  . Dysmenorrhea 01/27/2017  . History of herpes simplex infection 12/18/2013  . Flank pain  09/21/2012  . GERD (gastroesophageal reflux disease) 09/21/2012  . Herpes genitalia 09/21/2012    Past Surgical History:  Procedure Laterality Date  . DILATION AND CURETTAGE OF UTERUS    . ENDOMETRIAL ABLATION    . TUBAL LIGATION  2006    Current Outpatient Rx  . Order #: [id number] Class: Historical Med  . Order #: [id number] Class: Historical Med  . Order #: [id number] Class: Historical Med  . Order #: [id number] Class: Historical Med  . Order #: [id number] Class: Historical Med  . Order #: [id number] Class: Historical Med  . Order #: [id number] Class: Print  . Order #: [id number] Class: Historical Med  . Order #: [id number] Class: Normal  . Order #: [id number] Class: Historical Med  . Order #: [id number] Class: Normal    Allergies Patient has no known allergies.  Family History  Problem Relation Age of Onset  . Hypertension Maternal Grandmother   . Breast cancer Other     Social History Social History   Tobacco Use  . Smoking status: Never Smoker  . Smokeless tobacco: Never Used  Substance Use Topics  . Alcohol use: Yes    Comment: occ.  . Drug use: No    Review of Systems  All other systems negative except as documented in the HPI. All pertinent positives and negatives as reviewed in the HPI. ____________________________________________   PHYSICAL EXAM:  VITAL SIGNS: ED Triage Vitals  Enc Vitals Group     BP 08/10/17 0852 139/81     Pulse Rate 08/10/17 0845 81     Resp 08/10/17 0845 18     Temp 08/10/17 0845 97.9 F (36.6 C)     Temp Source 08/10/17 0845 Oral     SpO2 08/10/17 0845 100 %     Weight 08/10/17 0851 270 lb (122.5 kg)     Height 08/10/17 0851 5' (1.524 m)    Constitutional: Alert and oriented. Well appearing and in no acute distress. Eyes: Conjunctivae are normal. PERRL. EOMI. Head: Atraumatic. Nose: No congestion/rhinnorhea. Mouth/Throat: Mucous membranes are moist.  Oropharynx non-erythematous. Neck: No stridor.  No meningeal signs.     Cardiovascular: Normal rate, regular rhythm. Good peripheral circulation. Grossly normal heart sounds.   Respiratory: Normal respiratory effort.  No retractions. Lungs CTAB. Gastrointestinal: Soft and nontender. No distention.  Musculoskeletal: No lower extremity tenderness nor edema. No gross deformities of extremities. Neurologic:  Normal speech and language. No gross focal neurologic deficits are appreciated.  Skin:  Skin is warm, dry and intact. No rash noted.   ____________________________________________   LABS (all labs ordered are listed, but only abnormal results are displayed)  Labs Reviewed  CBC WITH DIFFERENTIAL/PLATELET - Abnormal; Notable for the following components:      Result Value   Hemoglobin 11.6 (*)    RDW 17.4 (*)    All other components within normal limits  COMPREHENSIVE METABOLIC PANEL - Abnormal; Notable for the following components:   Glucose, Bld 108 (*)    Total Bilirubin 0.1 (*)    All other components within normal limits  TROPONIN I   ____________________________________________  EKG   EKG Interpretation  Date/Time:  Wednesday August 10 2017 08:49:59 EST Ventricular Rate:  79 PR Interval:    QRS Duration: 83 QT Interval:  367 QTC Calculation: 421 R Axis:   53 Text Interpretation:  Sinus rhythm No significant change since last tracing Confirmed by Merrily Pew [phone]) on 08/10/2017 8:59:22 AM       ____________________________________________  RADIOLOGY  Dg Chest 2 View  Result Date: 08/10/2017 CLINICAL DATA:  Chest pain radiating down left arm. Shortness of breath . EXAM: CHEST  2 VIEW COMPARISON:  03/26/2015 . FINDINGS: Mediastinum hilar structures normal. Mild cardiomegaly. No pulmonary venous congestion. Mild left mid lung subsegmental atelectasis. No pleural effusion or pneumothorax. IMPRESSION: 1. Mild cardiomegaly.  No pulmonary venous congestion. 2. Mild left mid lung subsegmental atelectasis. No acute infiltrate noted.  Electronically Signed   By: Marcello Moores  Register   On: 08/10/2017 10:15    ____________________________________________   PROCEDURES  Procedure(s) performed:   Procedures   ____________________________________________   INITIAL IMPRESSION / ASSESSMENT AND PLAN / ED COURSE  PERC negative making PE unlikely in her.  ECG ok, but concerning story for coronary cause for her symptoms, will eval with troponin and then discuss with cardiology.  Lungs clear, not typical story for asthma, maybe bronchitis 2/2 coughing? Also on lisinopril which could cause cough, but doubt it could be related to CP/DOE.   Continuously without chest pain.  Discussed case with Dr. Harrington Challenger who recommends observation the hospitalist for here for likely stress test tomorrow secondary to her progressively worsening symptoms.  Discussed case with the hospitalist who will admit for the same.   Pertinent labs & imaging results that were available during my care of the patient were reviewed by me and considered in my medical decision making (see chart for details).  ____________________________________________  FINAL CLINICAL IMPRESSION(S) / ED DIAGNOSES  Final diagnoses:  Chest pain, unspecified type  DOE (dyspnea on exertion)     MEDICATIONS GIVEN DURING THIS VISIT:  Medications  aspirin chewable tablet 324 mg (324 mg Oral Given 08/10/17 0932)     NEW OUTPATIENT MEDICATIONS STARTED DURING THIS VISIT:  This SmartLink is deprecated. Use AVSMEDLIST instead to display the medication list for a patient.  Note:  This note was prepared with assistance of Dragon voice recognition software. Occasional wrong-word or sound-a-like substitutions may have occurred due to the inherent limitations of voice recognition software.   Merrily Pew, MD 08/10/17 [phone]

## 2017-08-11 ENCOUNTER — Encounter (HOSPITAL_COMMUNITY): Payer: Self-pay

## 2017-08-11 ENCOUNTER — Observation Stay (HOSPITAL_BASED_OUTPATIENT_CLINIC_OR_DEPARTMENT_OTHER): Payer: BLUE CROSS/BLUE SHIELD

## 2017-08-11 DIAGNOSIS — R0600 Dyspnea, unspecified: Secondary | ICD-10-CM

## 2017-08-11 DIAGNOSIS — R0609 Other forms of dyspnea: Secondary | ICD-10-CM | POA: Diagnosis not present

## 2017-08-11 DIAGNOSIS — R0789 Other chest pain: Secondary | ICD-10-CM | POA: Diagnosis not present

## 2017-08-11 DIAGNOSIS — R079 Chest pain, unspecified: Secondary | ICD-10-CM | POA: Diagnosis not present

## 2017-08-11 DIAGNOSIS — R072 Precordial pain: Secondary | ICD-10-CM

## 2017-08-11 LAB — NM MYOCAR MULTI W/SPECT W/WALL MOTION / EF
CHL RATE OF PERCEIVED EXERTION: 17
CSEPEW: 5.8 METS
Exercise duration (min): 3 min
Exercise duration (sec): 31 s
LVDIAVOL: 76 mL (ref 46–106)
LVSYSVOL: 32 mL
MPHR: 176 {beats}/min
NUC STRESS TID: 1.38
Peak HR: 160 {beats}/min
Percent HR: 90 %
RATE: 0.45
Rest HR: 84 {beats}/min
SDS: 5
SRS: 2
SSS: 6

## 2017-08-11 LAB — ECHOCARDIOGRAM COMPLETE
AO mean calculated velocity dopler: 136 cm/s
AOPV: 0.6 m/s
AV Area VTI index: 0.52 cm2/m2
AV Area VTI: 1.36 cm2
AV Area mean vel: 1.32 cm2
AV Mean grad: 9 mmHg
AV VEL mean LVOT/AV: 0.58
AV peak Index: 0.53
AV pk vel: 207 cm/s
AVA: 1.33 cm2
AVAREAMEANVIN: 0.51 cm2/m2
AVPG: 17 mmHg
CHL CUP AV VEL: 1.33
CHL CUP MV DEC (S): 204
CHL CUP RV SYS PRESS: 34 mmHg
CHL CUP TV REG PEAK VELOCITY: 280 cm/s
E decel time: 204 msec
E/e' ratio: 6.79
FS: 45 % — AB (ref 28–44)
HEIGHTINCHES: 60 in
IV/PV OW: 1.09
LA ID, A-P, ES: 31 mm
LA diam end sys: 31 mm
LA vol A4C: 35.3 ml
LA vol index: 10.6 mL/m2
LA vol: 27.3 mL
LADIAMINDEX: 1.21 cm/m2
LV SIMPSON'S DISK: 66
LV TDI E'MEDIAL: 9.46
LV dias vol index: 29 mL/m2
LV dias vol: 75 mL (ref 46–106)
LV sys vol index: 10 mL/m2
LVEEAVG: 6.79
LVEEMED: 6.79
LVELAT: 12.6 cm/s
LVOT SV: 48 mL
LVOT VTI: 21.1 cm
LVOT area: 2.27 cm2
LVOT diameter: 17 mm
LVOT peak grad rest: 6 mmHg
LVOT peak vel: 124 cm/s
LVOTVTI: 0.59 cm
LVSYSVOL: 25 mL (ref 14–42)
Lateral S' vel: 20.2 cm/s
MV Peak grad: 3 mmHg
MVPKAVEL: 87.1 m/s
MVPKEVEL: 85.6 m/s
PW: 10.2 mm — AB (ref 0.6–1.1)
Stroke v: 50 ml
TAPSE: 20.4 mm
TDI e' lateral: 12.6
TR max vel: 280 cm/s
VTI: 36 cm
Valve area index: 0.52
WEIGHTICAEL: 5051.18 [oz_av]

## 2017-08-11 LAB — BASIC METABOLIC PANEL
Anion gap: 11 (ref 5–15)
BUN: 12 mg/dL (ref 6–20)
CO2: 25 mmol/L (ref 22–32)
Calcium: 9.1 mg/dL (ref 8.9–10.3)
Chloride: 100 mmol/L — ABNORMAL LOW (ref 101–111)
Creatinine, Ser: 0.84 mg/dL (ref 0.44–1.00)
GFR calc Af Amer: >60 mL/min (ref >60)
GLUCOSE: 125 mg/dL — AB (ref 65–99)
POTASSIUM: 4 mmol/L (ref 3.5–5.1)
Sodium: 136 mmol/L (ref 135–145)

## 2017-08-11 LAB — GLUCOSE, CAPILLARY
GLUCOSE-CAPILLARY: 135 mg/dL — AB (ref 65–99)
GLUCOSE-CAPILLARY: 100 mg/dL — AB (ref 65–99)
Glucose-Capillary: 100 mg/dL — ABNORMAL HIGH (ref 65–99)

## 2017-08-11 LAB — CBC
HEMATOCRIT: 39.2 % (ref 36.0–46.0)
Hemoglobin: 12.3 g/dL (ref 12.0–15.0)
MCH: 26.6 pg (ref 26.0–34.0)
MCHC: 31.4 g/dL (ref 30.0–36.0)
MCV: 84.7 fL (ref 78.0–100.0)
Platelets: 260 10*3/uL (ref 150–400)
RBC: 4.63 MIL/uL (ref 3.87–5.11)
RDW: 17.4 % — AB (ref 11.5–15.5)
WBC: 6.4 10*3/uL (ref 4.0–10.5)

## 2017-08-11 LAB — TROPONIN I: Troponin I: <0.03 ng/mL (ref <0.03)

## 2017-08-11 LAB — HIV ANTIBODY (ROUTINE TESTING W REFLEX): HIV Screen 4th Generation wRfx: NONREACTIVE

## 2017-08-11 MED ORDER — TECHNETIUM TC 99M TETROFOSMIN IV KIT
30.0000 | PACK | Freq: Once | INTRAVENOUS | Status: AC | PRN
  Administered 2017-08-11: 32 via INTRAVENOUS

## 2017-08-11 MED ORDER — REGADENOSON 0.4 MG/5ML IV SOLN
INTRAVENOUS | Status: AC
  Filled 2017-08-11: qty 5

## 2017-08-11 MED ORDER — TECHNETIUM TC 99M TETROFOSMIN IV KIT
10.0000 | PACK | Freq: Once | INTRAVENOUS | Status: AC | PRN
  Administered 2017-08-11: 11 via INTRAVENOUS

## 2017-08-11 MED ORDER — SODIUM CHLORIDE 0.9% FLUSH
INTRAVENOUS | Status: AC
  Administered 2017-08-11: 10 mL via INTRAVENOUS
  Filled 2017-08-11: qty 10

## 2017-08-11 NOTE — Progress Notes (Signed)
*  PRELIMINARY RESULTS* Echocardiogram 2D Echocardiogram has been performed.  Susan [name] 08/11/2017, 3:41 PM

## 2017-08-11 NOTE — Progress Notes (Signed)
PROGRESS NOTE    Susan [name]  XBM:[id number] DOB: 16-Jan-1971 DOA: 08/10/2017 PCP: System, Pcp Not In   Brief Narrative:  Susan [name] is Susan [name] 46 y.o. female with medical history significant of HTN, T2DM, HLD presenting with chest pain and shortness of breath.  Assessment & Plan:   Principal Problem:   Chest pain Active Problems:   GERD (gastroesophageal reflux disease)   Chronic hypertension   Type 2 diabetes mellitus with hyperlipidemia (HCC)   DOE (dyspnea on exertion)   Chest Pain  Dyspnea on exertion:  Worse with exertion, better with rest.  Also some L arm radiation as well.  She also notes orthopnea and PND.  She has LEE as well.  Was prescribed albuterol which she's been using without relief.  Ddx includes ACS, HF, PE, etc.  CXR with atelectasis.  Initial trop negative, BNP negative.  EKG with NSR.      Given typical nature of sx, cards c/s for stress test. Echo given orthopnea and PND AM EKG with NSR Trend trops negative Hold beta blocker given planned stress S/p ASA, will add this on daily A1c, lipids (LDL 80, A1c 6.9) D dimer, also obtaining LE [place] as R>L LEE [ ]  will need PFTs if negative w/u  T2DM  HLD:  F/u A1c.  Hold metformin.  SSI.  On statin, needs ASA daily F/u lipid panel  HTN: hold beta blocker with stress.  Continue HCTZ and lisinopril   Gerd: holding PPI  DVT prophylaxis: lovenox Code Status: full Family Communication: father at bedside Disposition Plan: pending w/u   Consultants:   cardiology  Procedures: (Don't include imaging studies which can be auto populated. Include things that cannot be auto populated i.e. Echo, Carotid and venous dopplers, Foley, Bipap, HD, tubes/drains, wound vac, central lines etc)  Stress test and echo pending  Antimicrobials: (specify start and planned stop date. Auto populated tables are space occupying and do not give end dates)  none    Subjective: No CP or SOB. Had CP and SOB during stress.    Objective: Vitals:   08/10/17 1558 08/10/17 2234 08/11/17 1019 08/11/17 1400  BP: (!) 128/48 (!) 132/48 113/86 (!) 121/53  Pulse: 89 99 (!) 101 96  Resp: 20 20 18 18   Temp: 98.8 F (37.1 C) 98.8 F (37.1 C) 98.7 F (37.1 C) 98.3 F (36.8 C)  TempSrc: Oral Oral Oral Oral  SpO2: 98% 99% 99% 97%  Weight: (!) 143.2 kg (315 lb 11.2 oz)     Height: 5' (1.524 m)       Intake/Output Summary (Last 24 hours) at 08/11/2017 1533 Last data filed at 08/11/2017 1200 Gross per 24 hour  Intake 240 ml  Output -  Net 240 ml   Filed Weights   08/10/17 0851 08/10/17 1558  Weight: 122.5 kg (270 lb) (!) 143.2 kg (315 lb 11.2 oz)    Examination:  General: No acute distress. Cardiovascular: Heart sounds show [place] regular rate, and rhythm. No gallops or rubs. No murmurs. No JVD. Lungs: Clear to auscultation bilaterally with good air movement. No rales, rhonchi or wheezes. Abdomen: Soft, nontender, nondistended with normal active bowel sounds. No masses. No hepatosplenomegaly. Neurological: Alert and oriented 3. Moves all extremities 4 with equal strength. Cranial nerves II through XII grossly intact. Skin: Warm and dry. No rashes or lesions. Extremities: No clubbing or cyanosis. Trace edema. Psychiatric: Mood and affect are normal. Insight and judgment are appropriate.   Data Reviewed: I have personally reviewed  following labs and imaging studies  CBC: Recent Labs  Lab 08/10/17 0921 08/11/17 0706  WBC 8.9 6.4  NEUTROABS 5.3  --   HGB 11.6* 12.3  HCT 37.1 39.2  MCV 85.1 84.7  PLT 276 831   Basic Metabolic Panel: Recent Labs  Lab 08/10/17 0921 08/11/17 0706  NA 138 136  K 4.1 4.0  CL 102 100*  CO2 25 25  GLUCOSE 108* 125*  BUN 15 12  CREATININE 0.98 0.84  CALCIUM 8.9 9.1   GFR: Estimated Creatinine Clearance: 114.1 mL/min (by C-G formula based on SCr of 0.84 mg/dL). Liver Function Tests: Recent Labs  Lab 08/10/17 0921  AST 18  ALT 19  ALKPHOS 86  BILITOT 0.1*   PROT 7.3  ALBUMIN 3.5   No results for input(s): LIPASE, AMYLASE in the last 168 hours. No results for input(s): AMMONIA in the last 168 hours. Coagulation Profile: No results for input(s): INR, PROTIME in the last 168 hours. Cardiac Enzymes: Recent Labs  Lab 08/10/17 1650 08/10/17 1848 08/11/17 0038 08/11/17 0706 08/11/17 1237  TROPONINI <0.03 <0.03 <0.03 <0.03 <0.03   BNP (last 3 results) No results for input(s): PROBNP in the last 8760 hours. HbA1C: Recent Labs    08/10/17 1654  HGBA1C 6.9*   CBG: Recent Labs  Lab 08/10/17 1709 08/10/17 2232 08/11/17 1137  GLUCAP 108* 148* 135*   Lipid Profile: Recent Labs    08/10/17 1658  CHOL 138  HDL 42  LDLCALC 80  TRIG 80  CHOLHDL 3.3   Thyroid Function Tests: Recent Labs    08/10/17 1654  TSH 1.530   Anemia Panel: No results for input(s): VITAMINB12, FOLATE, FERRITIN, TIBC, IRON, RETICCTPCT in the last 72 hours. Sepsis Labs: No results for input(s): PROCALCITON, LATICACIDVEN in the last 168 hours.  No results found for this or any previous visit (from the past 240 hour(s)).       Radiology Studies: Dg Chest 2 View  Result Date: 08/10/2017 CLINICAL DATA:  Chest pain radiating down left arm. Shortness of breath . EXAM: CHEST  2 VIEW COMPARISON:  03/26/2015 . FINDINGS: Mediastinum hilar structures normal. Mild cardiomegaly. No pulmonary venous congestion. Mild left mid lung subsegmental atelectasis. No pleural effusion or pneumothorax. IMPRESSION: 1. Mild cardiomegaly.  No pulmonary venous congestion. 2. Mild left mid lung subsegmental atelectasis. No acute infiltrate noted. Electronically Signed   By: Marcello Moores  Register   On: 08/10/2017 10:15   US Venous Img Lower Bilateral  Result Date: 08/10/2017 CLINICAL DATA:  46 year old female with bilateral lower extremity swelling. EXAM: BILATERAL LOWER EXTREMITY VENOUS DOPPLER ULTRASOUND TECHNIQUE: Gray-scale sonography with graded compression, as well as color  Doppler and duplex ultrasound were performed to evaluate the lower extremity deep venous systems from the level of the common femoral vein and including the common femoral, femoral, profunda femoral, popliteal and calf veins including the posterior tibial, peroneal and gastrocnemius veins when visible. The superficial great saphenous vein was also interrogated. Spectral Doppler was utilized to evaluate flow at rest and with distal augmentation maneuvers in the common femoral, femoral and popliteal veins. COMPARISON:  None. FINDINGS: RIGHT LOWER EXTREMITY Common Femoral Vein: No evidence of thrombus. Normal compressibility, respiratory phasicity and response to augmentation. Saphenofemoral Junction: No evidence of thrombus. Normal compressibility and flow on color Doppler imaging. Profunda Femoral Vein: No evidence of thrombus. Normal compressibility and flow on color Doppler imaging. Femoral Vein: No evidence of thrombus. Normal compressibility, respiratory phasicity and response to augmentation. Popliteal Vein: No evidence of thrombus.  Normal compressibility, respiratory phasicity and response to augmentation. Calf Veins: No evidence of thrombus. Normal compressibility and flow on color Doppler imaging. Superficial Great Saphenous Vein: No evidence of thrombus. Normal compressibility. Venous Reflux:  None. Other Findings:  None. LEFT LOWER EXTREMITY Common Femoral Vein: No evidence of thrombus. Normal compressibility, respiratory phasicity and response to augmentation. Saphenofemoral Junction: No evidence of thrombus. Normal compressibility and flow on color Doppler imaging. Profunda Femoral Vein: No evidence of thrombus. Normal compressibility and flow on color Doppler imaging. Femoral Vein: No evidence of thrombus. Normal compressibility, respiratory phasicity and response to augmentation. Popliteal Vein: No evidence of thrombus. Normal compressibility, respiratory phasicity and response to augmentation. Calf  Veins: No evidence of thrombus. Normal compressibility and flow on color Doppler imaging. Superficial Great Saphenous Vein: No evidence of thrombus. Normal compressibility. Venous Reflux:  None. Other Findings:  None. IMPRESSION: No evidence of deep venous thrombosis. Electronically Signed   By: Jacqulynn Cadet M.D.   On: 08/10/2017 15:32        Scheduled Meds: . aspirin EC  81 mg Oral Daily  . enoxaparin (LOVENOX) injection  70 mg Subcutaneous Q24H  . hydrochlorothiazide  12.5 mg Oral Daily  . insulin aspart  0-5 Units Subcutaneous QHS  . insulin aspart  0-9 Units Subcutaneous TID WC  . lisinopril  10 mg Oral Daily  . montelukast  10 mg Oral QHS  . rosuvastatin  10 mg Oral Daily   Continuous Infusions:   LOS: 0 days    Time spent: over 20 min    Fayrene Helper, MD [facility] Pager [phone]  If 7PM-7AM, please contact night-coverage [email or web address] Password [facility] 08/11/2017, 3:33 PM

## 2017-08-11 NOTE — Progress Notes (Signed)
Echo today was normal  Exercise myovue:  Abnormal exercise response  At 3 min HR was 160  Consistent with poor conditioning  No ST changes Myovue with mid/distal anterior thinning that improves  May represent shifting breast attenuation vs ischemia  Note TID 1.38 but ? Significance with technetium   Would recomm dobutamine echo to eval LV thickening particularly in anterior wall.

## 2017-08-11 NOTE — Progress Notes (Signed)
Progress Note  Patient Name: Susan [name] Date of Encounter: 08/11/2017  Primary Cardiologist: Dr. Harrington Challenger   Subjective   (Pateient examined in stress lab prior to [place]).Denies chest pain overnight. No dyspnea   Inpatient Medications    Scheduled Meds: . regadenoson      . sodium chloride flush      . aspirin EC  81 mg Oral Daily  . enoxaparin (LOVENOX) injection  70 mg Subcutaneous Q24H  . hydrochlorothiazide  12.5 mg Oral Daily  . insulin aspart  0-5 Units Subcutaneous QHS  . insulin aspart  0-9 Units Subcutaneous TID WC  . lisinopril  10 mg Oral Daily  . montelukast  10 mg Oral QHS  . rosuvastatin  10 mg Oral Daily   Continuous Infusions:  PRN Meds: acetaminophen, technetium tetrofosmin   Vital Signs    Vitals:   08/10/17 1430 08/10/17 1500 08/10/17 1558 08/10/17 2234  BP: 129/67 135/80 (!) 128/48 (!) 132/48  Pulse: 85 84 89 99  Resp: 19 20 20 20   Temp:   98.8 F (37.1 C) 98.8 F (37.1 C)  TempSrc:   Oral Oral  SpO2: 98% 100% 98% 99%  Weight:   (!) 315 lb 11.2 oz (143.2 kg)   Height:   5' (1.524 m)    No intake or output data in the 24 hours ending 08/11/17 0816 Filed Weights   08/10/17 0851 08/10/17 1558  Weight: 270 lb (122.5 kg) (!) 315 lb 11.2 oz (143.2 kg)    Telemetry    Sinus tachycardia rate of 110 bpm prior to testing on cardiac monitor- Personally Reviewed  Physical Exam   GEN: No acute distress.  Morbidly obese Neck: No JVD, Neck obese, no thyromegaly.  Cardiac: RRR, 1/6 systolic murmur heard best at LSB, no rubs, or gallops.  Respiratory: Clear to auscultation bilaterally. GI: Soft, nontender, non-distended  MS: No edema; No deformity. Neuro:  Nonfocal  Psych: Normal affect   Labs    Chemistry Recent Labs  Lab 08/10/17 0921 08/11/17 0706  NA 138 136  K 4.1 4.0  CL 102 100*  CO2 25 25  GLUCOSE 108* 125*  BUN 15 12  CREATININE 0.98 0.84  CALCIUM 8.9 9.1  PROT 7.3  --   ALBUMIN 3.5  --   AST 18  --   ALT 19   --   ALKPHOS 86  --   BILITOT 0.1*  --   GFRNONAA >60 >60  GFRAA >60 >60  ANIONGAP 11 11     Hematology Recent Labs  Lab 08/10/17 0921 08/11/17 0706  WBC 8.9 6.4  RBC 4.36 4.63  HGB 11.6* 12.3  HCT 37.1 39.2  MCV 85.1 84.7  MCH 26.6 26.6  MCHC 31.3 31.4  RDW 17.4* 17.4*  PLT 276 260    Cardiac Enzymes Recent Labs  Lab 08/10/17 1650 08/10/17 1848 08/11/17 0038 08/11/17 0706  TROPONINI <0.03 <0.03 <0.03 <0.03   No results for input(s): TROPIPOC in the last 168 hours.   BNP Recent Labs  Lab 08/10/17 1314  BNP 17.0     DDimer  Recent Labs  Lab 08/10/17 1314  DDIMER 0.29     Radiology    Dg Chest 2 View  Result Date: 08/10/2017 CLINICAL DATA:  Chest pain radiating down left arm. Shortness of breath . EXAM: CHEST  2 VIEW COMPARISON:  03/26/2015 . FINDINGS: Mediastinum hilar structures normal. Mild cardiomegaly. No pulmonary venous congestion. Mild left mid lung subsegmental atelectasis. No pleural  effusion or pneumothorax. IMPRESSION: 1. Mild cardiomegaly.  No pulmonary venous congestion. 2. Mild left mid lung subsegmental atelectasis. No acute infiltrate noted. Electronically Signed   By: Marcello Moores  Register   On: 08/10/2017 10:15   US Venous Img Lower Bilateral  Result Date: 08/10/2017 CLINICAL DATA:  46 year old female with bilateral lower extremity swelling. EXAM: BILATERAL LOWER EXTREMITY VENOUS DOPPLER ULTRASOUND TECHNIQUE: Gray-scale sonography with graded compression, as well as color Doppler and duplex ultrasound were performed to evaluate the lower extremity deep venous systems from the level of the common femoral vein and including the common femoral, femoral, profunda femoral, popliteal and calf veins including the posterior tibial, peroneal and gastrocnemius veins when visible. The superficial great saphenous vein was also interrogated. Spectral Doppler was utilized to evaluate flow at rest and with distal augmentation maneuvers in the common femoral,  femoral and popliteal veins. COMPARISON:  None. FINDINGS: RIGHT LOWER EXTREMITY Common Femoral Vein: No evidence of thrombus. Normal compressibility, respiratory phasicity and response to augmentation. Saphenofemoral Junction: No evidence of thrombus. Normal compressibility and flow on color Doppler imaging. Profunda Femoral Vein: No evidence of thrombus. Normal compressibility and flow on color Doppler imaging. Femoral Vein: No evidence of thrombus. Normal compressibility, respiratory phasicity and response to augmentation. Popliteal Vein: No evidence of thrombus. Normal compressibility, respiratory phasicity and response to augmentation. Calf Veins: No evidence of thrombus. Normal compressibility and flow on color Doppler imaging. Superficial Great Saphenous Vein: No evidence of thrombus. Normal compressibility. Venous Reflux:  None. Other Findings:  None. LEFT LOWER EXTREMITY Common Femoral Vein: No evidence of thrombus. Normal compressibility, respiratory phasicity and response to augmentation. Saphenofemoral Junction: No evidence of thrombus. Normal compressibility and flow on color Doppler imaging. Profunda Femoral Vein: No evidence of thrombus. Normal compressibility and flow on color Doppler imaging. Femoral Vein: No evidence of thrombus. Normal compressibility, respiratory phasicity and response to augmentation. Popliteal Vein: No evidence of thrombus. Normal compressibility, respiratory phasicity and response to augmentation. Calf Veins: No evidence of thrombus. Normal compressibility and flow on color Doppler imaging. Superficial Great Saphenous Vein: No evidence of thrombus. Normal compressibility. Venous Reflux:  None. Other Findings:  None. IMPRESSION: No evidence of deep venous thrombosis. Electronically Signed   By: Jacqulynn Cadet M.D.   On: 08/10/2017 15:32    Cardiac Studies   Echo and NM stress test planned this am.   Patient Profile     46 y.o. female 46 y.o. female with a hx of DM  (x2 years, A1C 6.6), HTN, HLD, fluid retention who is being seen for the evaluation of CP/SOB at the request of Dr. Dayna Barker.Cardiac troponin is negative. Planned for stress test and echo this am. Multiple CVRF   Assessment & Plan    1. Chest Pain:  Worrisome for unstable angina. EKG and troponin argue against ACS. NM study and echo planned this am. Continue ASA.   2. Hypertension: Well controlled on hydrochlorothiazide and lisinopril.   3. Hypercholesterolemia: Continue Crestor  4. IDDM: Treatment per PCP team.   Signed, Phill Myron. Lawrence DNP, ANP, AACC   08/11/2017, 8:16 AM    Pt seen and examined  I Agree with findings as noted above by Arnold Long.   On exam, pt comfortable  No Cp   Just had stress test  Said she got SOB with sl tightness  BP went up Lungs are clear  Cardiac RRR  No S3  Ext without edema  Will review myovue stress test  Echo is pending   Dorris Carnes

## 2017-08-11 NOTE — Progress Notes (Signed)
EKG completed and placed on pts chart.  

## 2017-08-12 ENCOUNTER — Other Ambulatory Visit (HOSPITAL_COMMUNITY): Payer: Self-pay | Admitting: *Deleted

## 2017-08-12 ENCOUNTER — Observation Stay (HOSPITAL_COMMUNITY): Payer: BLUE CROSS/BLUE SHIELD

## 2017-08-12 DIAGNOSIS — K219 Gastro-esophageal reflux disease without esophagitis: Secondary | ICD-10-CM | POA: Diagnosis not present

## 2017-08-12 DIAGNOSIS — R0789 Other chest pain: Secondary | ICD-10-CM | POA: Diagnosis not present

## 2017-08-12 DIAGNOSIS — R0609 Other forms of dyspnea: Secondary | ICD-10-CM | POA: Diagnosis not present

## 2017-08-12 DIAGNOSIS — E1169 Type 2 diabetes mellitus with other specified complication: Secondary | ICD-10-CM

## 2017-08-12 DIAGNOSIS — R072 Precordial pain: Secondary | ICD-10-CM | POA: Diagnosis not present

## 2017-08-12 DIAGNOSIS — I10 Essential (primary) hypertension: Secondary | ICD-10-CM

## 2017-08-12 DIAGNOSIS — R609 Edema, unspecified: Secondary | ICD-10-CM | POA: Diagnosis not present

## 2017-08-12 DIAGNOSIS — E785 Hyperlipidemia, unspecified: Secondary | ICD-10-CM | POA: Diagnosis not present

## 2017-08-12 LAB — GLUCOSE, CAPILLARY
GLUCOSE-CAPILLARY: 121 mg/dL — AB (ref 65–99)
Glucose-Capillary: 123 mg/dL — ABNORMAL HIGH (ref 65–99)

## 2017-08-12 LAB — ECHOCARDIOGRAM STRESS TEST
Peak HR: 164 {beats}/min
Rest HR: 107 {beats}/min

## 2017-08-12 MED ORDER — ATROPINE SULFATE 1 MG/ML IJ SOLN
INTRAMUSCULAR | Status: AC
  Administered 2017-08-12: 0.5 mg via INTRAVENOUS
  Filled 2017-08-12: qty 1

## 2017-08-12 MED ORDER — METOPROLOL TARTRATE 25 MG PO TABS
25.0000 mg | ORAL_TABLET | Freq: Two times a day (BID) | ORAL | Status: DC
  Administered 2017-08-12: 25 mg via ORAL
  Filled 2017-08-12: qty 1

## 2017-08-12 MED ORDER — DOBUTAMINE IN D5W 4-5 MG/ML-% IV SOLN
INTRAVENOUS | Status: AC
  Administered 2017-08-12: 1000000 ug via INTRAVENOUS
  Filled 2017-08-12: qty 250

## 2017-08-12 NOTE — Progress Notes (Signed)
Progress Note  Patient Name: Susan [name] Date of Encounter: 08/12/2017  Primary Cardiologist: Dr. Dorris Carnes  Subjective   Patient seen and examined prior to dobutamine stress echo.  She denied any chest pain overnight.  Inpatient Medications    Scheduled Meds: . atropine      . aspirin EC  81 mg Oral Daily  . enoxaparin (LOVENOX) injection  70 mg Subcutaneous Q24H  . hydrochlorothiazide  12.5 mg Oral Daily  . insulin aspart  0-5 Units Subcutaneous QHS  . insulin aspart  0-9 Units Subcutaneous TID WC  . lisinopril  10 mg Oral Daily  . montelukast  10 mg Oral QHS  . rosuvastatin  10 mg Oral Daily    PRN Meds: acetaminophen   Vital Signs    Vitals:   08/11/17 1019 08/11/17 1400 08/11/17 2144 08/12/17 0500  BP: 113/86 (!) 121/53 (!) 112/58 (!) 146/52  Pulse: (!) 101 96 100 (!) 103  Resp: 18 18 20 14   Temp: 98.7 F (37.1 C) 98.3 F (36.8 C) 98.4 F (36.9 C) 98.6 F (37 C)  TempSrc: Oral Oral Oral Oral  SpO2: 99% 97% 99% 100%  Weight:      Height:        Intake/Output Summary (Last 24 hours) at 08/12/2017 0928 Last data filed at 08/11/2017 1753 Gross per 24 hour  Intake 480 ml  Output -  Net 480 ml   Filed Weights   08/10/17 0851 08/10/17 1558  Weight: 270 lb (122.5 kg) (!) 315 lb 11.2 oz (143.2 kg)    Telemetry    Sinus rhythm, sinus tachycardia heart rate 98-102 at rest.  Personally Reviewed  Physical Exam   GEN: No acute distress.  Morbidly obese Neck: No JVD Cardiac: RRR, tachycardic no murmurs, rubs, or gallops.  Respiratory: Clear to auscultation bilaterally. GI: Soft, nontender, non-distended  MS: No edema; No deformity. Neuro:  Nonfocal  Psych: Normal affect   Labs    Chemistry Recent Labs  Lab 08/10/17 0921 08/11/17 0706  NA 138 136  K 4.1 4.0  CL 102 100*  CO2 25 25  GLUCOSE 108* 125*  BUN 15 12  CREATININE 0.98 0.84  CALCIUM 8.9 9.1  PROT 7.3  --   ALBUMIN 3.5  --   AST 18  --   ALT 19  --   ALKPHOS 86  --     BILITOT 0.1*  --   GFRNONAA >60 >60  GFRAA >60 >60  ANIONGAP 11 11     Hematology Recent Labs  Lab 08/10/17 0921 08/11/17 0706  WBC 8.9 6.4  RBC 4.36 4.63  HGB 11.6* 12.3  HCT 37.1 39.2  MCV 85.1 84.7  MCH 26.6 26.6  MCHC 31.3 31.4  RDW 17.4* 17.4*  PLT 276 260    Cardiac Enzymes Recent Labs  Lab 08/10/17 1848 08/11/17 0038 08/11/17 0706 08/11/17 1237  TROPONINI <0.03 <0.03 <0.03 <0.03   No results for input(s): TROPIPOC in the last 168 hours.   BNP Recent Labs  Lab 08/10/17 1314  BNP 17.0     DDimer  Recent Labs  Lab 08/10/17 1314  DDIMER 0.29     Radiology    Dg Chest 2 View  Result Date: 08/10/2017 CLINICAL DATA:  Chest pain radiating down left arm. Shortness of breath . EXAM: CHEST  2 VIEW COMPARISON:  03/26/2015 . FINDINGS: Mediastinum hilar structures normal. Mild cardiomegaly. No pulmonary venous congestion. Mild left mid lung subsegmental atelectasis. No pleural effusion or pneumothorax.  IMPRESSION: 1. Mild cardiomegaly.  No pulmonary venous congestion. 2. Mild left mid lung subsegmental atelectasis. No acute infiltrate noted. Electronically Signed   By: Marcello Moores  Register   On: 08/10/2017 10:15   Nm Myocar Multi W/spect Tamela Oddi Motion / Ef  Result Date: 08/11/2017  Poor exercise tolerance Exaggerated HR response  There was no ST segment deviation noted during stress.  Thinning of the mid/distal anterior wall consistent with ischemia vs shifting breast attenuation. Recomm cardiac CT angiogram to define.  Nuclear stress EF: 58%.  Intermediate risk study.    US Venous Img Lower Bilateral  Result Date: 08/10/2017 CLINICAL DATA:  46 year old female with bilateral lower extremity swelling. EXAM: BILATERAL LOWER EXTREMITY VENOUS DOPPLER ULTRASOUND TECHNIQUE: Gray-scale sonography with graded compression, as well as color Doppler and duplex ultrasound were performed to evaluate the lower extremity deep venous systems from the level of the common  femoral vein and including the common femoral, femoral, profunda femoral, popliteal and calf veins including the posterior tibial, peroneal and gastrocnemius veins when visible. The superficial great saphenous vein was also interrogated. Spectral Doppler was utilized to evaluate flow at rest and with distal augmentation maneuvers in the common femoral, femoral and popliteal veins. COMPARISON:  None. FINDINGS: RIGHT LOWER EXTREMITY Common Femoral Vein: No evidence of thrombus. Normal compressibility, respiratory phasicity and response to augmentation. Saphenofemoral Junction: No evidence of thrombus. Normal compressibility and flow on color Doppler imaging. Profunda Femoral Vein: No evidence of thrombus. Normal compressibility and flow on color Doppler imaging. Femoral Vein: No evidence of thrombus. Normal compressibility, respiratory phasicity and response to augmentation. Popliteal Vein: No evidence of thrombus. Normal compressibility, respiratory phasicity and response to augmentation. Calf Veins: No evidence of thrombus. Normal compressibility and flow on color Doppler imaging. Superficial Great Saphenous Vein: No evidence of thrombus. Normal compressibility. Venous Reflux:  None. Other Findings:  None. LEFT LOWER EXTREMITY Common Femoral Vein: No evidence of thrombus. Normal compressibility, respiratory phasicity and response to augmentation. Saphenofemoral Junction: No evidence of thrombus. Normal compressibility and flow on color Doppler imaging. Profunda Femoral Vein: No evidence of thrombus. Normal compressibility and flow on color Doppler imaging. Femoral Vein: No evidence of thrombus. Normal compressibility, respiratory phasicity and response to augmentation. Popliteal Vein: No evidence of thrombus. Normal compressibility, respiratory phasicity and response to augmentation. Calf Veins: No evidence of thrombus. Normal compressibility and flow on color Doppler imaging. Superficial Great Saphenous Vein: No  evidence of thrombus. Normal compressibility. Venous Reflux:  None. Other Findings:  None. IMPRESSION: No evidence of deep venous thrombosis. Electronically Signed   By: Jacqulynn Cadet M.D.   On: 08/10/2017 15:32    Cardiac Studies   08/11/2017 Exercise myovue:  Abnormal exercise response  At 3 min HR was 160  Consistent with poor conditioning  No ST changes Myovue with mid/distal anterior thinning that improves  May represent shifting breast attenuation vs ischemia  Note TID 1.38 but ? Significance with technetium. recommend dobutamine echo to eval LV thickening particularly in anterior wall.    Echocardiogram 12.27.2018 Left ventricle: The cavity size was normal. Wall thickness was   normal. Systolic function was vigorous. The estimated ejection   fraction was in the range of 65% to 70%. Left ventricular   diastolic function parameters were normal. - Aortic valve: There was trivial regurgitation. Valve area (VTI):   1.33 cm^2. Valve area (Vmax): 1.36 cm^2. Valve area (Vmean): 1.32   cm^2. - Pulmonary arteries: PA peak pressure: 34 mm Hg (S).  Patient Profile  46 y.o. female with a hx of DM (x2 years, A1C 6.6), HTN, HLD, fluid retentionwho is being seen for the evaluation of CP/SOB.Cardiac troponin is negative. Had somewhat equivocal nuclear medicine stress test which revealed mid and distal anterior thinning that improved, uncertain if this was breast attenuation versus ischemia.  Therefore dobutamine echocardiogram is ordered this a.m.  Assessment & Plan    1.  Chest pain: Currently resolved.  She has been ruled out for ACS with negative troponin and EKG.  Echocardiogram reveals normal LV systolic function.  Dobutamine stress echo this a.m. With results pending.  2.  Hypertension: Currently well controlled.  3.  Hypercholesterolemia: Continue statin therapy with Crestor.  4.  Insulin-dependent diabetes: Continue treatment per PCP team.  Signed, Phill Myron. West Pugh,  ANP, AACC   08/12/2017, 9:28 AM      Attending note:  Patient seen and examined. Reviewed recent cardiology consultation per Dr. Harrington Challenger and subsequent testing including an equivocal exercise Myoview suggesting either anterior ischemia or variable breast attenuation. She has ruled out for ACS with negative troponin I levels reports no recurrent chest pain under observation. Dobutamine echocardiogram completed this morning with results pending.  On examination she is comfortable, no active chest pain. She remains in sinus rhythm by telemetry which I personally reviewed. Systolic blood pressure 355H. Lungs are clear without labored breathing. Cardiac exam reveals distant RRR without gallop. Lab work shows troponin I levels less than 0.033. ECG from 12/27 shows sinus rhythm with nonspecific ST changes.  Presentation with chest pain, presently resolved and associated with negative cardiac enzymes. Plan to review dobutamine echocardiogram results.  Satira Sark, M.D., F.A.C.C.

## 2017-08-12 NOTE — Discharge Summary (Signed)
Physician Discharge Summary  Susan [name] ZOX:[id number] DOB: July 31, 1971 DOA: 08/10/2017  PCP: System, Pcp Not In  Admit date: 08/10/2017 Discharge date: 08/12/2017  Admitted From: Home Disposition:  Home  Recommendations for Outpatient Follow-up:  1. Establish care with a new primary care physician in the next 2-3 weeks 2. Referral for pulmonology sent 3. Discussed follow-up at [facility] in [place] for further management of weight 4. Please obtain BMP/CBC in one week 5. Turn to the hospital for any increased work of breathing, shortness of breath, chest pain  Home Health: None  Equipment/Devices: None   Discharge Condition: Stable  CODE STATUS: Full code  Diet recommendation: Heart healthy, carb modified diet  Brief/Interim Summary: Susan [name] a 46 y.o.femalewith medical history significant ofHTN, T2DM, HLDpresenting with chest pain and shortness of breath.  Etiology was consulted after patient's troponins were negative for evaluation and possible inpatient stress test.  Echocardiogram done on 08/11/2017 showed Left ventricle: The cavity size was normal. Wall thickness was normal. Systolic function was vigorous. The estimated ejection  fraction was in the range of 65% to 70%. Left ventricular   diastolic function parameters were normal. Aortic valve: There was trivial regurgitation. Valve area (VTI): 1.33 cm^2. Valve area (Vmax): 1.36 cm^2. Valve area (Vmean): 1.32 cm^2.Pulmonary arteries: PA peak pressure: 34 mm Hg (S).  Exercise stress test done on 08/11/2017 showed poor exercise tolerance, exaggerated heart rate response, no ST segment deviation during stress, ejection fraction of 58%, and a thinning of the mid/distal anterior wall consistent with ischemia versus shifting breast attenuation.  Cardiac CT angiogram was recommended.  As well as a dobutamine stress test.  Dobutamine stress test was done on 08/12/2017 and was normal.  Patient was stable for  discharge on 08/12/2017.  Discussed with her the following up outpatient with her primary care physician for pulmonary function test to evaluate possible lung etiology.  She states she would like a referral to pulmonology to further work this up.  Discussed weight management as well as exercise which patient states she would like to see a more aggressive primary care physician to further guide her in her weight management efforts.  She also voiced that she would like to be referred to the new [facility] in [place] for further evaluation.   Discharge Diagnoses:  Principal Problem:   Chest pain Active Problems:   GERD (gastroesophageal reflux disease)   Chronic hypertension   Type 2 diabetes mellitus with hyperlipidemia (HCC)   DOE (dyspnea on exertion)    Discharge Instructions  Discharge Instructions    Ambulatory referral to Pulmonology   Complete by:  As directed    Call MD for:  difficulty breathing, headache or visual disturbances   Complete by:  As directed    Call MD for:  extreme fatigue   Complete by:  As directed    Call MD for:  hives   Complete by:  As directed    Call MD for:  persistant dizziness or light-headedness   Complete by:  As directed    Call MD for:  persistant nausea and vomiting   Complete by:  As directed    Call MD for:  severe uncontrolled pain   Complete by:  As directed    Call MD for:  temperature >100.4   Complete by:  As directed    Diet - low sodium heart healthy   Complete by:  As directed    Discharge instructions   Complete  by:  As directed    Follow up with PCP- or establish care with a new PCP Referral to pulmonology Referral to [facility]   Increase activity slowly   Complete by:  As directed      Allergies as of 08/12/2017   No Known Allergies     Medication List    TAKE these medications   albuterol 108 (90 Base) MCG/ACT inhaler Commonly known as:  PROVENTIL HFA;VENTOLIN  HFA Inhale 1-2 puffs into the lungs every 6 (six) hours as needed for wheezing or shortness of breath.   DEXILANT 60 MG capsule Generic drug:  dexlansoprazole Take 60 mg by mouth daily.   fluticasone 50 MCG/ACT nasal spray Commonly known as:  FLONASE Place 2 sprays into both nostrils daily.   hydrochlorothiazide 12.5 MG capsule Commonly known as:  MICROZIDE Take by mouth daily.   lisinopril 10 MG tablet Commonly known as:  PRINIVIL,ZESTRIL Take 10 mg by mouth daily.   metFORMIN 1000 MG tablet Commonly known as:  GLUCOPHAGE Take 500 mg by mouth 2 (two) times daily.   metoprolol tartrate 25 MG tablet Commonly known as:  LOPRESSOR Take 1 tablet (25 mg total) by mouth 2 (two) times daily.   montelukast 10 MG tablet Commonly known as:  SINGULAIR Take 10 mg by mouth at bedtime.   promethazine 25 MG tablet Commonly known as:  PHENERGAN TAKE 1 TABLET(25 MG) BY MOUTH EVERY 6 HOURS AS NEEDED FOR NAUSEA OR VOMITING   rosuvastatin 10 MG tablet Commonly known as:  CRESTOR Take 10 mg by mouth daily.   valACYclovir 1000 MG tablet Commonly known as:  VALTREX TAKE 1 TABLET BY MOUTH TWICE DAILY       No Known Allergies  Consultations:  Cardiology   Procedures/Studies: Dg Chest 2 View  Result Date: 08/10/2017 CLINICAL DATA:  Chest pain radiating down left arm. Shortness of breath . EXAM: CHEST  2 VIEW COMPARISON:  03/26/2015 . FINDINGS: Mediastinum hilar structures normal. Mild cardiomegaly. No pulmonary venous congestion. Mild left mid lung subsegmental atelectasis. No pleural effusion or pneumothorax. IMPRESSION: 1. Mild cardiomegaly.  No pulmonary venous congestion. 2. Mild left mid lung subsegmental atelectasis. No acute infiltrate noted. Electronically Signed   By: Marcello Moores  Register   On: 08/10/2017 10:15   Nm Myocar Multi W/spect Tamela Oddi Motion / Ef  Result Date: 08/11/2017  Poor exercise tolerance Exaggerated HR response  There was no ST segment deviation noted  during stress.  Thinning of the mid/distal anterior wall consistent with ischemia vs shifting breast attenuation. Recomm cardiac CT angiogram to define.  Nuclear stress EF: 58%.  Intermediate risk study.    US Venous Img Lower Bilateral  Result Date: 08/10/2017 CLINICAL DATA:  46 year old female with bilateral lower extremity swelling. EXAM: BILATERAL LOWER EXTREMITY VENOUS DOPPLER ULTRASOUND TECHNIQUE: Gray-scale sonography with graded compression, as well as color Doppler and duplex ultrasound were performed to evaluate the lower extremity deep venous systems from the level of the common femoral vein and including the common femoral, femoral, profunda femoral, popliteal and calf veins including the posterior tibial, peroneal and gastrocnemius veins when visible. The superficial great saphenous vein was also interrogated. Spectral Doppler was utilized to evaluate flow at rest and with distal augmentation maneuvers in the common femoral, femoral and popliteal veins. COMPARISON:  None. FINDINGS: RIGHT LOWER EXTREMITY Common Femoral Vein: No evidence of thrombus. Normal compressibility, respiratory phasicity and response to augmentation. Saphenofemoral Junction: No evidence of thrombus. Normal compressibility and flow on color Doppler imaging.  Profunda Femoral Vein: No evidence of thrombus. Normal compressibility and flow on color Doppler imaging. Femoral Vein: No evidence of thrombus. Normal compressibility, respiratory phasicity and response to augmentation. Popliteal Vein: No evidence of thrombus. Normal compressibility, respiratory phasicity and response to augmentation. Calf Veins: No evidence of thrombus. Normal compressibility and flow on color Doppler imaging. Superficial Great Saphenous Vein: No evidence of thrombus. Normal compressibility. Venous Reflux:  None. Other Findings:  None. LEFT LOWER EXTREMITY Common Femoral Vein: No evidence of thrombus. Normal compressibility, respiratory phasicity and  response to augmentation. Saphenofemoral Junction: No evidence of thrombus. Normal compressibility and flow on color Doppler imaging. Profunda Femoral Vein: No evidence of thrombus. Normal compressibility and flow on color Doppler imaging. Femoral Vein: No evidence of thrombus. Normal compressibility, respiratory phasicity and response to augmentation. Popliteal Vein: No evidence of thrombus. Normal compressibility, respiratory phasicity and response to augmentation. Calf Veins: No evidence of thrombus. Normal compressibility and flow on color Doppler imaging. Superficial Great Saphenous Vein: No evidence of thrombus. Normal compressibility. Venous Reflux:  None. Other Findings:  None. IMPRESSION: No evidence of deep venous thrombosis. Electronically Signed   By: Jacqulynn Cadet M.D.   On: 08/10/2017 15:32    Echocardiogram: Left ventricle: The cavity size was normal. Wall thickness was   normal. Systolic function was vigorous. The estimated ejection fraction was in the range of 65% to 70%. Left ventricular diastolic function parameters were normal. - Aortic valve: There was trivial regurgitation. Valve area (VTI): 1.33 cm^2. Valve area (Vmax): 1.36 cm^2. Valve area (Vmean): 1.32 cm^2. - Pulmonary arteries: PA peak pressure: 34 mm Hg (S).  Subjective: Patient was seen and examined.  She voices she would like a referral to see a pulmonologist for further evaluation of possible lung disease.  She says she would like to increase her exercise tolerance at home.  Further discussion about nutrition and diet control prior to discharge.  Discharge Exam: Vitals:   08/11/17 2144 08/12/17 0500  BP: (!) 112/58 (!) 146/52  Pulse: 100 (!) 103  Resp: 20 14  Temp: 98.4 F (36.9 C) 98.6 F (37 C)  SpO2: 99% 100%   Vitals:   08/11/17 1019 08/11/17 1400 08/11/17 2144 08/12/17 0500  BP: 113/86 (!) 121/53 (!) 112/58 (!) 146/52  Pulse: (!) 101 96 100 (!) 103  Resp: 18 18 20 14   Temp: 98.7 F (37.1 C) 98.3 F  (36.8 C) 98.4 F (36.9 C) 98.6 F (37 C)  TempSrc: Oral Oral Oral Oral  SpO2: 99% 97% 99% 100%  Weight:      Height:        General: Pt is alert, awake, not in acute distress Cardiovascular: RRR, S1/S2 +, no rubs, no gallops Respiratory: CTA bilaterally, no wheezing, no rhonchi Abdominal: Soft, obese, NT, ND, bowel sounds + Extremities: no edema, no cyanosis    The results of significant diagnostics from this hospitalization (including imaging, microbiology, ancillary and laboratory) are listed below for reference.     Microbiology: No results found for this or any previous visit (from the past 240 hour(s)).   Labs: BNP (last 3 results) Recent Labs    08/10/17 1314  BNP 03.4   Basic Metabolic Panel: Recent Labs  Lab 08/10/17 0921 08/11/17 0706  NA 138 136  K 4.1 4.0  CL 102 100*  CO2 25 25  GLUCOSE 108* 125*  BUN 15 12  CREATININE 0.98 0.84  CALCIUM 8.9 9.1   Liver Function Tests: Recent Labs  Lab 08/10/17 0921  AST  18  ALT 19  ALKPHOS 86  BILITOT 0.1*  PROT 7.3  ALBUMIN 3.5   No results for input(s): LIPASE, AMYLASE in the last 168 hours. No results for input(s): AMMONIA in the last 168 hours. CBC: Recent Labs  Lab 08/10/17 0921 08/11/17 0706  WBC 8.9 6.4  NEUTROABS 5.3  --   HGB 11.6* 12.3  HCT 37.1 39.2  MCV 85.1 84.7  PLT 276 260   Cardiac Enzymes: Recent Labs  Lab 08/10/17 1650 08/10/17 1848 08/11/17 0038 08/11/17 0706 08/11/17 1237  TROPONINI <0.03 <0.03 <0.03 <0.03 <0.03   BNP: Invalid input(s): POCBNP CBG: Recent Labs  Lab 08/11/17 1137 08/11/17 1657 08/11/17 2142 08/12/17 1013 08/12/17 1135  GLUCAP 135* 100* 100* 121* 123*   D-Dimer Recent Labs    08/10/17 1314  DDIMER 0.29   Hgb A1c Recent Labs    08/10/17 1654  HGBA1C 6.9*   Lipid Profile Recent Labs    08/10/17 1658  CHOL 138  HDL 42  LDLCALC 80  TRIG 80  CHOLHDL 3.3   Thyroid function studies Recent Labs    08/10/17 1654  TSH 1.530    Anemia work up No results for input(s): VITAMINB12, FOLATE, FERRITIN, TIBC, IRON, RETICCTPCT in the last 72 hours. Urinalysis No results found for: COLORURINE, APPEARANCEUR, LABSPEC, [place], GLUCOSEU, HGBUR, BILIRUBINUR, KETONESUR, PROTEINUR, UROBILINOGEN, NITRITE, LEUKOCYTESUR Sepsis Labs Invalid input(s): PROCALCITONIN,  WBC,  LACTICIDVEN Microbiology No results found for this or any previous visit (from the past 240 hour(s)).   Time coordinating discharge: 35 minutes  SIGNED:   Loretha Stapler, MD  [facility] 08/12/2017, 3:15 PM Pager [phone] If 7PM-7AM, please contact night-coverage [email or web address] Password [email or web address]

## 2017-08-12 NOTE — Progress Notes (Signed)
Discharge instructions gone over with patient, verbalized understanding. IV removed, patient tolerated procedure well. 

## 2017-08-21 ENCOUNTER — Other Ambulatory Visit: Payer: Self-pay | Admitting: Adult Health

## 2017-09-12 ENCOUNTER — Encounter: Payer: Self-pay | Admitting: Emergency Medicine

## 2017-09-12 ENCOUNTER — Ambulatory Visit: Payer: BLUE CROSS/BLUE SHIELD | Admitting: Emergency Medicine

## 2017-09-12 VITALS — BP 118/82 | HR 104 | Ht 60.0 in | Wt 276.0 lb

## 2017-09-12 DIAGNOSIS — R0609 Other forms of dyspnea: Secondary | ICD-10-CM | POA: Diagnosis not present

## 2017-09-12 DIAGNOSIS — R0789 Other chest pain: Secondary | ICD-10-CM | POA: Diagnosis not present

## 2017-09-12 DIAGNOSIS — R0602 Shortness of breath: Secondary | ICD-10-CM

## 2017-09-12 NOTE — Patient Instructions (Signed)
We will perform pulmonary function testing here on the same day as your next visit. You may stop using albuterol on a schedule.  Keep this available to use 2 puffs if needed for shortness of breath Continue your Dexilant as you have been taking it Continue Singulair.  Start taking this in the evenings. Follow with Dr Lamonte Sakai next available with pulmonary function testing on the same day.

## 2017-09-12 NOTE — Assessment & Plan Note (Signed)
Etiology unclear but she has had a reassuring cardiac evaluation.  Some question of asthma based on her presentation to her primary doctor.  She did not get much relief from albuterol.  I believe the asthma question needs to be definitively answered.  We will perform pulmonary function testing.  Depending on the results and depending on her symptoms this may reflect deconditioning. We may consider a CPST.   We will perform pulmonary function testing here on the same day as your next visit. You may stop using albuterol on a schedule.  Keep this available to use 2 puffs if needed for shortness of breath Continue your Dexilant as you have been taking it Continue Singulair.  Start taking this in the evenings. Follow with Dr Lamonte Sakai next available with pulmonary function testing on the same day.

## 2017-09-12 NOTE — Progress Notes (Signed)
Subjective:    Patient ID: Susan [name], female    DOB: 1970/10/02, 47 y.o.   MRN: [id number]  HPI 47 year old never smoker with a history of diabetes, hypertension, gastroesophageal reflux.  She was hospitalized end of December after experiencing slowly progressive exertional shortness of breath and chest tightness.  Also reported some orthopnea and increased lower extremity edema at that time.  She had a Myoview with exercise that was characterized by significant tachycardia at 3 minutes consistent with poor conditioning but no ST changes.  She underwent a dobutamine stress echo during that hospitalization that showed no evidence of ischemia, no wall motion abnormalities.  She is referred now for continued evaluation of her dyspnea.  Chest x-ray done 08/10/17 was reviewed.  This showed mild left mid lung segmental atelectasis without any other significant abnormalities.  She reports exertional dyspnea over the last 4 months. A bit of heaviness at rest. She has heard wheeze, was treated empirically for possible asthma w singulair and albuterol without much change. Her weight has increased about 30 lbs over the last 2 years. She is around second hand smoke from family. She has congestion - better on singulair. Has flonase available for prn. She is planning to attend a weight loss clinic appointment. She has GERD, on dexilant.   She works as a Quarry manager at [employer].  No family hx lung disease.    Review of Systems  Constitutional: Negative for fever and unexpected weight change.  HENT: Negative for congestion, dental problem, ear pain, nosebleeds, postnasal drip, rhinorrhea, sinus pressure, sneezing, sore throat and trouble swallowing.   Eyes: Negative for redness and itching.  Respiratory: Positive for shortness of breath. Negative for cough, chest tightness and wheezing.   Cardiovascular: Positive for chest pain. Negative for palpitations and leg swelling.  Gastrointestinal: Negative for nausea  and vomiting.  Genitourinary: Negative for dysuria.  Musculoskeletal: Negative for joint swelling.  Skin: Negative for rash.  Neurological: Positive for headaches.  Hematological: Does not bruise/bleed easily.  Psychiatric/Behavioral: Negative for dysphoric mood. The patient is not nervous/anxious.     Past Medical History:  Diagnosis Date  . Anxiety   . Diabetes mellitus ([place])   . Fluid retention   . Heart murmur   . Herpes simplex without mention of complication   . History of herpes simplex infection 12/18/2013  . Hyperlipidemia   . Hypertension   . Ovarian cyst   . Reflux      Family History  Problem Relation Age of Onset  . Hypertension Maternal Grandmother   . Breast cancer Other   . Colon cancer Maternal Grandfather      Social History   Socioeconomic History  . Marital status: Married    Spouse name: Not on file  . Number of children: Not on file  . Years of education: Not on file  . Highest education level: Not on file  Social Needs  . Financial resource strain: Not on file  . Food insecurity - worry: Not on file  . Food insecurity - inability: Not on file  . Transportation needs - medical: Not on file  . Transportation needs - non-medical: Not on file  Occupational History  . Not on file  Tobacco Use  . Smoking status: Never Smoker  . Smokeless tobacco: Never Used  Substance and Sexual Activity  . Alcohol use: Yes    Comment: occ. 2 drinks/month  . Drug use: No  . Sexual activity: Yes    Birth control/protection: Surgical  Comment: btl and ablation  Other Topics Concern  . Not on file  Social History Narrative  . Not on file  Formerly worked Charity fundraiser, now a CNA  No Known Allergies   Outpatient Medications Prior to Visit  Medication Sig Dispense Refill  . albuterol (PROVENTIL HFA;VENTOLIN HFA) 108 (90 Base) MCG/ACT inhaler Inhale 1-2 puffs into the lungs every 6 (six) hours as needed for wheezing or shortness of breath.    . DEXILANT 60 MG  capsule Take 60 mg by mouth daily.   0  . fluticasone (FLONASE) 50 MCG/ACT nasal spray Place 2 sprays into both nostrils daily.     . hydrochlorothiazide (MICROZIDE) 12.5 MG capsule Take by mouth daily.  2  . lisinopril (PRINIVIL,ZESTRIL) 10 MG tablet Take 10 mg by mouth daily.  2  . metFORMIN (GLUCOPHAGE) 1000 MG tablet Take 500 mg by mouth 2 (two) times daily.   3  . metoprolol (LOPRESSOR) 25 MG tablet Take 1 tablet (25 mg total) by mouth 2 (two) times daily. 30 tablet 0  . montelukast (SINGULAIR) 10 MG tablet Take 10 mg by mouth at bedtime.    . promethazine (PHENERGAN) 25 MG tablet TAKE 1 TABLET(25 MG) BY MOUTH EVERY 6 HOURS AS NEEDED FOR NAUSEA OR VOMITING 30 tablet 0  . promethazine (PHENERGAN) 25 MG tablet TAKE 1 TABLET(25 MG) BY MOUTH EVERY 6 HOURS AS NEEDED FOR NAUSEA OR VOMITING 30 tablet 0  . rosuvastatin (CRESTOR) 10 MG tablet Take 10 mg by mouth daily.    . valACYclovir (VALTREX) 1000 MG tablet TAKE 1 TABLET BY MOUTH TWICE DAILY 60 tablet 3   No facility-administered medications prior to visit.         Objective:   Physical Exam  Vitals:   09/12/17 1010 09/12/17 1011  BP:  118/82  Pulse:  (!) 104  SpO2:  95%  Weight: 276 lb (125.2 kg)   Height: 5' (1.524 m)    Gen: Pleasant, well-nourished, in no distress,  normal affect  ENT: No lesions,  mouth clear,  oropharynx clear, no postnasal drip  Neck: No JVD, no TMG, no carotid bruits  Lungs: No use of accessory muscles, no dullness to percussion, clear without rales or rhonchi  Cardiovascular: RRR, heart sounds normal, no murmur or gallops, no peripheral edema  Musculoskeletal: No deformities, no cyanosis or clubbing  Neuro: alert, non focal  Skin: Warm, no lesions or rashes     Assessment & Plan:  DOE (dyspnea on exertion) Etiology unclear but she has had a reassuring cardiac evaluation.  Some question of asthma based on her presentation to her primary doctor.  She did not get much relief from albuterol.  I  believe the asthma question needs to be definitively answered.  We will perform pulmonary function testing.  Depending on the results and depending on her symptoms this may reflect deconditioning. We may consider a CPST.   We will perform pulmonary function testing here on the same day as your next visit. You may stop using albuterol on a schedule.  Keep this available to use 2 puffs if needed for shortness of breath Continue your Dexilant as you have been taking it Continue Singulair.  Start taking this in the evenings. Follow with Dr Lamonte Sakai next available with pulmonary function testing on the same day.  Chest pain Question breakthrough GERD.  Question airflow obstruction   Baltazar Apo, MD, PhD 09/12/2017, 10:41 AM [facility] [phone] or if no answer [phone]

## 2017-09-12 NOTE — Assessment & Plan Note (Signed)
Question breakthrough GERD.  Question airflow obstruction

## 2017-10-12 ENCOUNTER — Ambulatory Visit: Payer: BLUE CROSS/BLUE SHIELD | Admitting: Emergency Medicine

## 2017-10-12 ENCOUNTER — Encounter: Payer: Self-pay | Admitting: Emergency Medicine

## 2017-10-12 ENCOUNTER — Ambulatory Visit (INDEPENDENT_AMBULATORY_CARE_PROVIDER_SITE_OTHER): Payer: BLUE CROSS/BLUE SHIELD | Admitting: Emergency Medicine

## 2017-10-12 DIAGNOSIS — J309 Allergic rhinitis, unspecified: Secondary | ICD-10-CM | POA: Insufficient documentation

## 2017-10-12 DIAGNOSIS — K219 Gastro-esophageal reflux disease without esophagitis: Secondary | ICD-10-CM | POA: Diagnosis not present

## 2017-10-12 DIAGNOSIS — J301 Allergic rhinitis due to pollen: Secondary | ICD-10-CM

## 2017-10-12 DIAGNOSIS — R0609 Other forms of dyspnea: Secondary | ICD-10-CM

## 2017-10-12 DIAGNOSIS — R0602 Shortness of breath: Secondary | ICD-10-CM | POA: Diagnosis not present

## 2017-10-12 LAB — PULMONARY FUNCTION TEST
DL/VA % pred: 157 %
DL/VA: 6.67 ml/min/mmHg/L
DLCO unc % pred: 112 %
DLCO unc: 21.2 ml/min/mmHg
FEF 25-75 Post: 1.68 L/sec
FEF 25-75 Pre: 2.12 L/sec
FEF2575-%Change-Post: -20 %
FEF2575-%Pred-Post: 70 %
FEF2575-%Pred-Pre: 88 %
FEV1-%Change-Post: 0 %
FEV1-%Pred-Post: 75 %
FEV1-%Pred-Pre: 75 %
FEV1-Post: 1.59 L
FEV1-Pre: 1.59 L
FEV1FVC-%Change-Post: 3 %
FEV1FVC-%Pred-Pre: 102 %
FEV6-%Change-Post: -1 %
FEV6-%Pred-Post: 72 %
FEV6-%Pred-Pre: 73 %
FEV6-Post: 1.81 L
FEV6-Pre: 1.83 L
FEV6FVC-%Pred-Post: 102 %
FEV6FVC-%Pred-Pre: 102 %
FVC-%Change-Post: -3 %
FVC-%Pred-Post: 70 %
FVC-%Pred-Pre: 73 %
FVC-Post: 1.81 L
FVC-Pre: 1.88 L
Post FEV1/FVC ratio: 88 %
Post FEV6/FVC ratio: 100 %
Pre FEV1/FVC ratio: 85 %
Pre FEV6/FVC Ratio: 100 %
RV % pred: 98 %
RV: 1.45 L
TLC % pred: 80 %
TLC: 3.58 L

## 2017-10-12 NOTE — Assessment & Plan Note (Signed)
She is benefiting from Singulair, Flonase.  She will continue these.

## 2017-10-12 NOTE — Assessment & Plan Note (Signed)
Continue Dexilant as ordered 

## 2017-10-12 NOTE — Assessment & Plan Note (Signed)
Cardiac evaluation has been reassuring, as has chest x-ray.  Pulmonary function testing today shows evidence for restriction on spirometry, possible superimposed obstruction although the evidence for this is week.  Based on her failure to respond to albuterol, Singulair I suspect that the likelihood for asthma is low.  Lung volumes are low normal.  Diffusion capacity corrects to normal for alveolar volume.  I think will be reasonable to have her work on weight loss and cardiopulmonary conditioning for several months to see if her breathing improves, if she benefits.  If she continues to have dyspnea even after these interventions then it might be relevant to check a cardiopulmonary exercise test or methacholine challenge.  I will see her in 6 months and we can discuss.

## 2017-10-12 NOTE — Progress Notes (Signed)
PFT completed today 10/12/17  

## 2017-10-12 NOTE — Progress Notes (Signed)
Subjective:    Patient ID: Susan [name], female    DOB: 12/23/1970, 47 y.o.   MRN: [id number]  Shortness of Breath  Associated symptoms include chest pain and headaches. Pertinent negatives include no ear pain, fever, leg swelling, rash, rhinorrhea, sore throat, vomiting or wheezing.   47 year old never smoker with a history of diabetes, hypertension, gastroesophageal reflux.  She was hospitalized end of December after experiencing slowly progressive exertional shortness of breath and chest tightness.  Also reported some orthopnea and increased lower extremity edema at that time.  She had a Myoview with exercise that was characterized by significant tachycardia at 3 minutes consistent with poor conditioning but no ST changes.  She underwent a dobutamine stress echo during that hospitalization that showed no evidence of ischemia, no wall motion abnormalities.  She is referred now for continued evaluation of her dyspnea.  Chest x-ray done 08/10/17 was reviewed.  This showed mild left mid lung segmental atelectasis without any other significant abnormalities.  She reports exertional dyspnea over the last 4 months. A bit of heaviness at rest. She has heard wheeze, was treated empirically for possible asthma w singulair and albuterol without much change. Her weight has increased about 30 lbs over the last 2 years. She is around second hand smoke from family. She has congestion - better on singulair. Has flonase available for prn. She is planning to attend a weight loss clinic appointment. She has GERD, on dexilant.   She works as a Quarry manager at [employer].  No family hx lung disease.   ROV 10/12/17 --this is a follow-up visit for exertional dyspnea.  47 year old woman with diabetes, hypertension, GERD, obesity.  She has had intermittent wheezing and chest tightness, has been treated empirically for possible asthma with Singulair and albuterol.  She underwent pulmonary function testing today that I have  reviewed.  Her spirometry shows principally restrictive pattern consider possible coexisting obstruction given a grossly normal FEV1 to FVC ratio.  Total lung capacity and lung volumes were normal.  Diffusion capacity elevated. She still has albuterol available. Continues to have exertional SOB, some exertional noise / wheeze   Review of Systems  Constitutional: Negative for fever and unexpected weight change.  HENT: Negative for congestion, dental problem, ear pain, nosebleeds, postnasal drip, rhinorrhea, sinus pressure, sneezing, sore throat and trouble swallowing.   Eyes: Negative for redness and itching.  Respiratory: Positive for shortness of breath. Negative for cough, chest tightness and wheezing.   Cardiovascular: Positive for chest pain. Negative for palpitations and leg swelling.  Gastrointestinal: Negative for nausea and vomiting.  Genitourinary: Negative for dysuria.  Musculoskeletal: Negative for joint swelling.  Skin: Negative for rash.  Neurological: Positive for headaches.  Hematological: Does not bruise/bleed easily.  Psychiatric/Behavioral: Negative for dysphoric mood. The patient is not nervous/anxious.     Past Medical History:  Diagnosis Date  . Anxiety   . Diabetes mellitus ([place])   . Fluid retention   . Heart murmur   . Herpes simplex without mention of complication   . History of herpes simplex infection 12/18/2013  . Hyperlipidemia   . Hypertension   . Ovarian cyst   . Reflux      Family History  Problem Relation Age of Onset  . Hypertension Maternal Grandmother   . Breast cancer Other   . Colon cancer Maternal Grandfather      Social History   Socioeconomic History  . Marital status: Married    Spouse name: Not on file  . Number  of children: Not on file  . Years of education: Not on file  . Highest education level: Not on file  Social Needs  . Financial resource strain: Not on file  . Food insecurity - worry: Not on file  . Food insecurity -  inability: Not on file  . Transportation needs - medical: Not on file  . Transportation needs - non-medical: Not on file  Occupational History  . Not on file  Tobacco Use  . Smoking status: Never Smoker  . Smokeless tobacco: Never Used  Substance and Sexual Activity  . Alcohol use: Yes    Comment: occ. 2 drinks/month  . Drug use: No  . Sexual activity: Yes    Birth control/protection: Surgical    Comment: btl and ablation  Other Topics Concern  . Not on file  Social History Narrative  . Not on file  Formerly worked Charity fundraiser, now a CNA  No Known Allergies   Outpatient Medications Prior to Visit  Medication Sig Dispense Refill  . albuterol (PROVENTIL HFA;VENTOLIN HFA) 108 (90 Base) MCG/ACT inhaler Inhale 1-2 puffs into the lungs every 6 (six) hours as needed for wheezing or shortness of breath.    . DEXILANT 60 MG capsule Take 60 mg by mouth daily.   0  . fluticasone (FLONASE) 50 MCG/ACT nasal spray Place 2 sprays into both nostrils daily.     . hydrochlorothiazide (MICROZIDE) 12.5 MG capsule Take by mouth daily.  2  . lisinopril (PRINIVIL,ZESTRIL) 10 MG tablet Take 10 mg by mouth daily.  2  . metFORMIN (GLUCOPHAGE) 1000 MG tablet Take 500 mg by mouth 2 (two) times daily.   3  . metoprolol (LOPRESSOR) 25 MG tablet Take 1 tablet (25 mg total) by mouth 2 (two) times daily. 30 tablet 0  . montelukast (SINGULAIR) 10 MG tablet Take 10 mg by mouth at bedtime.    . promethazine (PHENERGAN) 25 MG tablet TAKE 1 TABLET(25 MG) BY MOUTH EVERY 6 HOURS AS NEEDED FOR NAUSEA OR VOMITING 30 tablet 0  . rosuvastatin (CRESTOR) 10 MG tablet Take 10 mg by mouth daily.    . valACYclovir (VALTREX) 1000 MG tablet TAKE 1 TABLET BY MOUTH TWICE DAILY 60 tablet 3  . promethazine (PHENERGAN) 25 MG tablet TAKE 1 TABLET(25 MG) BY MOUTH EVERY 6 HOURS AS NEEDED FOR NAUSEA OR VOMITING 30 tablet 0   No facility-administered medications prior to visit.         Objective:   Physical Exam  Vitals:   10/12/17  1611 10/12/17 1615  BP:  110/70  Pulse:  89  SpO2:  100%  Weight: 278 lb (126.1 kg)   Height: 5' (1.524 m)    Gen: Pleasant, well-nourished, in no distress,  normal affect  ENT: No lesions,  mouth clear,  oropharynx clear, no postnasal drip  Neck: No JVD, no TMG, no carotid bruits  Lungs: No use of accessory muscles, no dullness to percussion, clear without rales or rhonchi  Cardiovascular: RRR, heart sounds normal, no murmur or gallops, no peripheral edema  Musculoskeletal: No deformities, no cyanosis or clubbing  Neuro: alert, non focal  Skin: Warm, no lesions or rashes     Assessment & Plan:  DOE (dyspnea on exertion) Cardiac evaluation has been reassuring, as has chest x-ray.  Pulmonary function testing today shows evidence for restriction on spirometry, possible superimposed obstruction although the evidence for this is week.  Based on her failure to respond to albuterol, Singulair I suspect that the likelihood for asthma  is low.  Lung volumes are low normal.  Diffusion capacity corrects to normal for alveolar volume.  I think will be reasonable to have her work on weight loss and cardiopulmonary conditioning for several months to see if her breathing improves, if she benefits.  If she continues to have dyspnea even after these interventions then it might be relevant to check a cardiopulmonary exercise test or methacholine challenge.  I will see her in 6 months and we can discuss.  GERD (gastroesophageal reflux disease) Continue Dexilant as ordered  Allergic rhinitis She is benefiting from Singulair, Flonase.  She will continue these.   Baltazar Apo, MD, PhD 10/12/2017, 5:07 PM [facility] [phone] or if no answer [phone]

## 2017-10-12 NOTE — Patient Instructions (Signed)
Start your exercise regimen 3 days a week. Start slowly for the first 2 weeks and then build up work load gradually.  Continue your singulair and flonase as you are taking them  Continue dexilant as you have been taking it.  Take albuterol 2 puffs up to every 4 hours if needed for shortness of breath.  We will not start a scheduled inhaler right now.  Follow with Dr Lamonte Sakai in 6 months or sooner if you have any problems

## 2017-10-27 ENCOUNTER — Encounter (INDEPENDENT_AMBULATORY_CARE_PROVIDER_SITE_OTHER): Payer: BLUE CROSS/BLUE SHIELD

## 2017-11-15 ENCOUNTER — Ambulatory Visit (INDEPENDENT_AMBULATORY_CARE_PROVIDER_SITE_OTHER): Payer: BLUE CROSS/BLUE SHIELD | Admitting: Family Medicine

## 2017-11-15 ENCOUNTER — Encounter (INDEPENDENT_AMBULATORY_CARE_PROVIDER_SITE_OTHER): Payer: Self-pay | Admitting: Family Medicine

## 2017-11-15 VITALS — BP 144/81 | HR 81 | Temp 97.7°F | Ht 60.0 in | Wt 278.0 lb

## 2017-11-15 DIAGNOSIS — R0602 Shortness of breath: Secondary | ICD-10-CM

## 2017-11-15 DIAGNOSIS — I10 Essential (primary) hypertension: Secondary | ICD-10-CM | POA: Diagnosis not present

## 2017-11-15 DIAGNOSIS — E119 Type 2 diabetes mellitus without complications: Secondary | ICD-10-CM | POA: Diagnosis not present

## 2017-11-15 DIAGNOSIS — Z1331 Encounter for screening for depression: Secondary | ICD-10-CM

## 2017-11-15 DIAGNOSIS — Z0289 Encounter for other administrative examinations: Secondary | ICD-10-CM

## 2017-11-15 DIAGNOSIS — R400 Somnolence: Secondary | ICD-10-CM | POA: Diagnosis not present

## 2017-11-15 DIAGNOSIS — Z6841 Body Mass Index (BMI) 40.0 and over, adult: Secondary | ICD-10-CM | POA: Diagnosis not present

## 2017-11-15 DIAGNOSIS — E7849 Other hyperlipidemia: Secondary | ICD-10-CM | POA: Diagnosis not present

## 2017-11-15 DIAGNOSIS — R5383 Other fatigue: Secondary | ICD-10-CM

## 2017-11-15 NOTE — Progress Notes (Signed)
Office: [phone]  /  Fax: [phone]   Dear Dr. Lamonte Sakai,   Thank you for referring Susan [name] to our clinic. The following note includes my evaluation and treatment recommendations.  HPI:   Chief Complaint: OBESITY    Susan [name] has been referred by Collene Gobble, MD for consultation regarding her obesity and obesity related comorbidities.    Susan [name] (MR# [id number]) is a 47 y.o. female who presents on 11/15/2017 for obesity evaluation and treatment. Current BMI is Body mass index is 54.29 kg/m.Marland Kitchen Susan [name] has been struggling with her weight for many years and has been unsuccessful in either losing weight, maintaining weight loss, or reaching her healthy weight goal.     Susan [name] attended our information session and states she is currently in the action stage of change and ready to dedicate time achieving and maintaining a healthier weight. Susan [name] is interested in becoming our patient and working on intensive lifestyle modifications including (but not limited to) diet, exercise and weight loss.    Susan [name] states her family eats meals together she thinks her family will eat healthier with  her she struggles with family and or coworkers weight loss sabotage her desired weight loss is 78 lbs she started gaining weight after her 2nd child her heaviest weight ever was 278 lbs. she has significant food cravings issues  she snacks frequently in the evenings she skips meals frequently she is frequently drinking liquids with calories she frequently eats larger portions than normal  she struggles with emotional eating    Fatigue Susan [name] feels her energy is lower than it should be. This has worsened with weight gain and has not worsened recently. Susan [name] admits to daytime somnolence and admits to waking up still tired. Patient is at risk for obstructive sleep apnea. Patent has a history of symptoms of daytime fatigue, morning fatigue, morning headache and hypertension. Patient  generally gets 5 hours of sleep per night, and states they generally have restless sleep. Snoring is present. Apneic episodes are present. Epworth Sleepiness Score is 18  Dyspnea on exertion Susan [name] notes increasing shortness of breath with exercising and seems to be worsening over time with weight gain. She notes getting out of breath sooner with activity than she used to. This has not gotten worse recently. Susan [name] admits orthopnea.  Diabetes II Susan [name] has a diagnosis of diabetes type II. Sophy states she is not checking blood sugar at home. She has had an A1c of 6.9 in the last year. Nychelle is on metformin and she admits polyphagia, especially in the evening. She is attempting to work on intensive lifestyle modifications including diet, exercise, and weight loss to help control her blood glucose levels.  Daytime Somnolence Susan [name] states she sleeps approximately 5 hours per night and is out of breath lying flat. Susan [name] sleeps in a recliner. She admits to snoring all her life. Her husband has witnessed apneic episodes. She has an Epworth scale of 18.Marland Kitchen   Hypertension Susan [name] is a 47 y.o. female with hypertension. Her blood pressure is elevated today on Lisinopril, Metoprolol and HCTZ Susan [name] denies chest pain and admits to morning headaches. She is attempting to work on weight loss to help control her blood pressure with the goal of decreasing her risk of heart attack and stroke. Susan [name] blood pressure is not currently controlled.  Hyperlipidemia Susan [name] has hyperlipidemia and is on Crestor. She would like to attempt to control her cholesterol levels with intensive lifestyle modification including a  low saturated fat diet, exercise and weight loss. She denies any chest pain or myalgias.  Depression Screen Susan [name]'s Food and Mood (modified PHQ-9) score was  Depression screen PHQ 2/9 11/15/2017  Decreased Interest 2  Down, Depressed, Hopeless 2  PHQ - 2 Score 4  Altered sleeping 3    Tired, decreased energy 3  Change in appetite 1  Feeling bad or failure about yourself  1  Trouble concentrating 1  Moving slowly or fidgety/restless 1  Suicidal thoughts 0  PHQ-9 Score 14  Difficult doing work/chores Somewhat difficult    ALLERGIES: No Known Allergies  MEDICATIONS: Current Outpatient Medications on File Prior to Visit  Medication Sig Dispense Refill  . DEXILANT 60 MG capsule Take 60 mg by mouth daily.   0  . fluticasone (FLONASE) 50 MCG/ACT nasal spray Place 2 sprays into both nostrils daily.     . hydrochlorothiazide (MICROZIDE) 12.5 MG capsule Take by mouth daily.  2  . lisinopril (PRINIVIL,ZESTRIL) 10 MG tablet Take 10 mg by mouth daily.  2  . metFORMIN (GLUCOPHAGE) 1000 MG tablet Take 500 mg by mouth 2 (two) times daily.   3  . metoprolol (LOPRESSOR) 25 MG tablet Take 1 tablet (25 mg total) by mouth 2 (two) times daily. 30 tablet 0  . montelukast (SINGULAIR) 10 MG tablet Take 10 mg by mouth at bedtime.    . promethazine (PHENERGAN) 25 MG tablet TAKE 1 TABLET(25 MG) BY MOUTH EVERY 6 HOURS AS NEEDED FOR NAUSEA OR VOMITING 30 tablet 0  . rosuvastatin (CRESTOR) 10 MG tablet Take 10 mg by mouth daily.    . tizanidine (ZANAFLEX) 2 MG capsule Take 2 mg by mouth 3 (three) times daily.     No current facility-administered medications on file prior to visit.     PAST MEDICAL HISTORY: Past Medical History:  Diagnosis Date  . Anxiety   . Diabetes mellitus ([place])   . Fluid retention   . GERD (gastroesophageal reflux disease)   . Heart murmur   . Herpes simplex without mention of complication   . History of herpes simplex infection 12/18/2013  . Hyperlipidemia   . Hypertension   . Ovarian cyst   . Reflux   . Sciatica   . SOB (shortness of breath)     PAST SURGICAL HISTORY: Past Surgical History:  Procedure Laterality Date  . DILATION AND CURETTAGE OF UTERUS    . ENDOMETRIAL ABLATION    . TUBAL LIGATION  2006    SOCIAL HISTORY: Social History   Tobacco  Use  . Smoking status: Never Smoker  . Smokeless tobacco: Never Used  Substance Use Topics  . Alcohol use: Yes    Comment: occ. 2 drinks/month  . Drug use: No    FAMILY HISTORY: Family History  Problem Relation Age of Onset  . Hypertension Maternal Grandmother   . Breast cancer Other   . Diabetes Mother   . High blood pressure Mother   . High Cholesterol Mother   . Diabetes Father   . High blood pressure Father   . High Cholesterol Father   . Alcoholism Father   . Obesity Father   . Colon cancer Maternal Grandfather     ROS: Review of Systems  Constitutional: Positive for malaise/fatigue.  HENT: Positive for congestion (nasal stuffiness) and sinus pain.        Dry Mouth  Eyes:       Wear Glasses or Contacts  Respiratory: Positive for cough, shortness of breath and wheezing.  Painful or difficulty breathing  Cardiovascular: Positive for palpitations and orthopnea. Negative for chest pain.       Shortness of Breath with activity Chest Tightness Calf/Leg Pain with walking Leg Cramping  Gastrointestinal: Positive for heartburn.  Musculoskeletal: Positive for back pain. Negative for myalgias.  Skin:       Dryness   Neurological: Positive for headaches.  Endo/Heme/Allergies: Positive for polydipsia.       Positive for polyphagia  Psychiatric/Behavioral:       Stress    PHYSICAL EXAM: Blood pressure (!) 144/81, pulse 81, temperature 97.7 F (36.5 C), temperature source Oral, height 5' (1.524 m), weight 278 lb (126.1 kg), last menstrual period 10/05/2017, SpO2 97 %. Body mass index is 54.29 kg/m. Physical Exam  Constitutional: She is oriented to person, place, and time. She appears well-developed and well-nourished.  HENT:  Head: Normocephalic and atraumatic.  Nose: Nose normal.  Eyes: EOM are normal. No scleral icterus.  Neck: Normal range of motion. Neck supple. No thyromegaly present.  Cardiovascular: Normal rate and regular rhythm.  Pulmonary/Chest:  Effort normal. No respiratory distress.  Abdominal: Soft. There is no tenderness.  + obesity  Musculoskeletal: Normal range of motion.  Range of Motion normal in all 4 extremities  Neurological: She is oriented to person, place, and time. Coordination normal.  Skin: Skin is warm and dry.  Psychiatric: She has a normal mood and affect. Her behavior is normal.  Vitals reviewed.   RECENT LABS AND TESTS: BMET    Component Value Date/Time   NA 136 08/11/2017 0706   K 4.0 08/11/2017 0706   CL 100 (L) 08/11/2017 0706   CO2 25 08/11/2017 0706   GLUCOSE 125 (H) 08/11/2017 0706   BUN 12 08/11/2017 0706   CREATININE 0.84 08/11/2017 0706   CREATININE 0.79 09/21/2012 1515   CALCIUM 9.1 08/11/2017 0706   GFRNONAA >60 08/11/2017 0706   GFRAA >60 08/11/2017 0706   Lab Results  Component Value Date   HGBA1C 6.9 (H) 08/10/2017   No results found for: INSULIN CBC    Component Value Date/Time   WBC 6.4 08/11/2017 0706   RBC 4.63 08/11/2017 0706   HGB 12.3 08/11/2017 0706   HCT 39.2 08/11/2017 0706   PLT 260 08/11/2017 0706   MCV 84.7 08/11/2017 0706   MCH 26.6 08/11/2017 0706   MCHC 31.4 08/11/2017 0706   RDW 17.4 (H) 08/11/2017 0706   LYMPHSABS 2.7 08/10/2017 0921   MONOABS 0.6 08/10/2017 0921   EOSABS 0.4 08/10/2017 0921   BASOSABS 0.0 08/10/2017 0921   Iron/TIBC/Ferritin/ %Sat No results found for: IRON, TIBC, FERRITIN, IRONPCTSAT Lipid Panel     Component Value Date/Time   CHOL 138 08/10/2017 1658   TRIG 80 08/10/2017 1658   HDL 42 08/10/2017 1658   CHOLHDL 3.3 08/10/2017 1658   VLDL 16 08/10/2017 1658   LDLCALC 80 08/10/2017 1658   Hepatic Function Panel     Component Value Date/Time   PROT 7.3 08/10/2017 0921   ALBUMIN 3.5 08/10/2017 0921   AST 18 08/10/2017 0921   ALT 19 08/10/2017 0921   ALKPHOS 86 08/10/2017 0921   BILITOT 0.1 (L) 08/10/2017 0921      Component Value Date/Time   TSH 1.530 08/10/2017 1654    ECG  shows NSR with a rate of 79  BPM INDIRECT CALORIMETER done today shows a VO2 of 218 and a REE of 1517.  Her calculated basal metabolic rate is 6213 thus her basal metabolic rate is worse  than expected.    ASSESSMENT AND PLAN: Other fatigue - Plan: EKG 12-Lead, Vitamin B12, CBC With Differential, Folate, T3, T4, free, TSH, VITAMIN D 25 Hydroxy (Vit-D Deficiency, Fractures)  Shortness of breath on exertion - Plan: CBC With Differential  Type 2 diabetes mellitus without complication, without long-term current use of insulin (HCC) - Plan: Comprehensive metabolic panel, Hemoglobin A1c, Insulin, random  Essential hypertension  Other hyperlipidemia - Plan: Lipid Panel With LDL/HDL Ratio  Daytime somnolence - Plan: Ambulatory referral to Sleep Studies  Depression screening  Class 3 severe obesity with serious comorbidity and body mass index (BMI) of 50.0 to 59.9 in adult, unspecified obesity type (HCC)  PLAN: Fatigue Aundria was informed that her fatigue may be related to obesity, depression or many other causes. Labs will be ordered, and in the meanwhile Susan [name] has agreed to work on diet, exercise and weight loss to help with fatigue. Proper sleep hygiene was discussed including the need for 7-8 hours of quality sleep each night. A sleep study was ordered based on symptoms and Epworth score.  Dyspnea on exertion Valentine is followed by cardiology and it was determined that her shortness of breath is obesity related and she was sent to [place] for weight loss. Stress echo shows ejection fraction approximately 70%, but did not find diastolic dysfunction. She has agreed to work on weight loss and gradually increase exercise to treat her exercise induced shortness of breath. If Danniella follows our instructions and loses weight without improvement of her shortness of breath, we will plan to refer to pulmonology. We will monitor this condition regularly. Susan [name] agrees to this plan.  Diabetes II Susan [name] has been given extensive diabetes  education by myself today including ideal fasting and post-prandial blood glucose readings, individual ideal Hgb A1c goals and hypoglycemia prevention. We discussed the importance of good blood sugar control to decrease the likelihood of diabetic complications such as nephropathy, neuropathy, limb loss, blindness, coronary artery disease, and death. We discussed the importance of intensive lifestyle modification including diet, exercise and weight loss as the first line treatment for diabetes. Susan [name] agrees to start diet and check her blood sugar every day. We will check labs and Gregoria agrees to continue metformin as prescribed follow up at the agreed upon time.  Daytime Somnolence We will refer to Dr. Brett Fairy for sleep consult (referral sent to [facility]). Carrington agrees to follow up with our clinic in 2 weeks.  Hypertension We discussed sodium restriction, working on healthy weight loss, and a regular exercise program as the means to achieve improved blood pressure control. Her blood pressure may be elevated due to sleep apnea. Jalise agreed with this plan and agreed to follow up as directed. We will continue to monitor her blood pressure closely as well as her progress with the above lifestyle modifications. She will continue her medications as prescribed and start diet. She will watch for signs of hypotension as she continues her lifestyle modifications.  Hyperlipidemia Marene was informed of the American Heart Association Guidelines emphasizing intensive lifestyle modifications as the first line treatment for hyperlipidemia. We discussed many lifestyle modifications today in depth, and Krissi will continue to work on decreasing saturated fats such as fatty red meat, butter and many fried foods. She will also increase vegetables and lean protein in her diet and continue to work on exercise and weight loss efforts. We will check labs and she will continue Crestor as  prescribed.  Depression Screen Brennley had a moderately positive depression screening.  Depression is commonly associated with obesity and often results in emotional eating behaviors. We will monitor this closely and work on CBT to help improve the non-hunger eating patterns. Referral to Psychology may be required if no improvement is seen as she continues in our clinic.  Obesity Bilan is currently in the action stage of change and her goal is to continue with weight loss efforts. I recommend Lyncoln begin the structured treatment plan as follows:  She has agreed to follow the Category 2 plan +100 calories Brinnley has been instructed to eventually work up to a goal of 150 minutes of combined cardio and strengthening exercise per week for weight loss and overall health benefits. We discussed the following Behavioral Modification Strategies today: no skipping meals, better snacking choices, increasing lean protein intake, decreasing simple carbohydrates  and decrease eating out   She was informed of the importance of frequent follow up visits to maximize her success with intensive lifestyle modifications for her multiple health conditions. She was informed we would discuss her lab results at her next visit unless there is a critical issue that needs to be addressed sooner. Reginae agreed to keep her next visit at the agreed upon time to discuss these results.    OBESITY BEHAVIORAL INTERVENTION VISIT  Today's visit was # 1 out of 22.  Starting weight: 278 lbs Starting date: 11/15/17 Today's weight : 278 lbs  Today's date: 11/15/2017 Total lbs lost to date: 0 (Patients must lose 7 lbs in the first 6 months to continue with counseling)   ASK: We discussed the diagnosis of obesity with Susan Porter Bogle today and Machele agreed to give [place] permission to discuss obesity behavioral modification therapy today.  ASSESS: Anyssa has the diagnosis of obesity and her BMI today is 54.29 Jaelene is in the action  stage of change   ADVISE: Dahlila was educated on the multiple health risks of obesity as well as the benefit of weight loss to improve her health. She was advised of the need for long term treatment and the importance of lifestyle modifications.  AGREE: Multiple dietary modification options and treatment options were discussed and  Yarel agreed to the above obesity treatment plan.   I, Doreene Nest, am acting as transcriptionist for  Dennard Nip, MD  I have reviewed the above documentation for accuracy and completeness, and I agree with the above. -Dennard Nip, MD

## 2017-11-16 LAB — COMPREHENSIVE METABOLIC PANEL
A/G RATIO: 1.2 (ref 1.2–2.2)
ALT: 18 IU/L (ref 0–32)
AST: 16 IU/L (ref 0–40)
Albumin: 3.7 g/dL (ref 3.5–5.5)
Alkaline Phosphatase: 94 IU/L (ref 39–117)
BUN/Creatinine Ratio: 13 (ref 9–23)
BUN: 11 mg/dL (ref 6–24)
Bilirubin Total: <0.2 mg/dL (ref 0.0–1.2)
CALCIUM: 9.1 mg/dL (ref 8.7–10.2)
CO2: 22 mmol/L (ref 20–29)
CREATININE: 0.82 mg/dL (ref 0.57–1.00)
Chloride: 102 mmol/L (ref 96–106)
GFR calc Af Amer: 101 mL/min/{1.73_m2} (ref >59)
GFR, EST NON AFRICAN AMERICAN: 87 mL/min/{1.73_m2} (ref >59)
GLUCOSE: 107 mg/dL — AB (ref 65–99)
Globulin, Total: 3.2 g/dL (ref 1.5–4.5)
POTASSIUM: 4.7 mmol/L (ref 3.5–5.2)
Sodium: 139 mmol/L (ref 134–144)
Total Protein: 6.9 g/dL (ref 6.0–8.5)

## 2017-11-16 LAB — CBC WITH DIFFERENTIAL
BASOS ABS: 0 10*3/uL (ref 0.0–0.2)
Basos: 1 %
EOS (ABSOLUTE): 0.3 10*3/uL (ref 0.0–0.4)
Eos: 5 %
Hematocrit: 37.5 % (ref 34.0–46.6)
Hemoglobin: 12 g/dL (ref 11.1–15.9)
Immature Grans (Abs): 0 10*3/uL (ref 0.0–0.1)
Immature Granulocytes: 0 %
Lymphocytes Absolute: 2.2 10*3/uL (ref 0.7–3.1)
Lymphs: 33 %
MCH: 26.7 pg (ref 26.6–33.0)
MCHC: 32 g/dL (ref 31.5–35.7)
MCV: 83 fL (ref 79–97)
MONOS ABS: 0.5 10*3/uL (ref 0.1–0.9)
Monocytes: 8 %
NEUTROS PCT: 53 %
Neutrophils Absolute: 3.5 10*3/uL (ref 1.4–7.0)
RBC: 4.5 x10E6/uL (ref 3.77–5.28)
RDW: 16.9 % — AB (ref 12.3–15.4)
WBC: 6.6 10*3/uL (ref 3.4–10.8)

## 2017-11-16 LAB — LIPID PANEL WITH LDL/HDL RATIO
Cholesterol, Total: 132 mg/dL (ref 100–199)
HDL: 44 mg/dL (ref >39)
LDL Calculated: 74 mg/dL (ref 0–99)
LDL/HDL RATIO: 1.7 ratio (ref 0.0–3.2)
TRIGLYCERIDES: 70 mg/dL (ref 0–149)
VLDL Cholesterol Cal: 14 mg/dL (ref 5–40)

## 2017-11-16 LAB — INSULIN, RANDOM: INSULIN: 42.5 u[IU]/mL — ABNORMAL HIGH (ref 2.6–24.9)

## 2017-11-16 LAB — TSH: TSH: 1.61 u[IU]/mL (ref 0.450–4.500)

## 2017-11-16 LAB — HEMOGLOBIN A1C
ESTIMATED AVERAGE GLUCOSE: 151 mg/dL
HEMOGLOBIN A1C: 6.9 % — AB (ref 4.8–5.6)

## 2017-11-16 LAB — T3: T3, Total: 120 ng/dL (ref 71–180)

## 2017-11-16 LAB — VITAMIN B12: VITAMIN B 12: 305 pg/mL (ref 232–1245)

## 2017-11-16 LAB — VITAMIN D 25 HYDROXY (VIT D DEFICIENCY, FRACTURES): VIT D 25 HYDROXY: 29 ng/mL — AB (ref 30.0–100.0)

## 2017-11-16 LAB — FOLATE: Folate: 13.8 ng/mL (ref >3.0)

## 2017-11-16 LAB — T4, FREE: Free T4: 0.9 ng/dL (ref 0.82–1.77)

## 2017-11-29 ENCOUNTER — Ambulatory Visit (INDEPENDENT_AMBULATORY_CARE_PROVIDER_SITE_OTHER): Payer: BLUE CROSS/BLUE SHIELD | Admitting: Family Medicine

## 2017-11-29 VITALS — BP 121/74 | HR 72 | Temp 99.0°F | Ht 60.0 in | Wt 273.0 lb

## 2017-11-29 DIAGNOSIS — E119 Type 2 diabetes mellitus without complications: Secondary | ICD-10-CM

## 2017-11-29 DIAGNOSIS — Z6841 Body Mass Index (BMI) 40.0 and over, adult: Secondary | ICD-10-CM | POA: Diagnosis not present

## 2017-11-29 DIAGNOSIS — Z9189 Other specified personal risk factors, not elsewhere classified: Secondary | ICD-10-CM | POA: Diagnosis not present

## 2017-11-29 DIAGNOSIS — E559 Vitamin D deficiency, unspecified: Secondary | ICD-10-CM

## 2017-11-29 MED ORDER — VITAMIN D (ERGOCALCIFEROL) 1.25 MG (50000 UNIT) PO CAPS: 50000.0000 [IU] | ORAL_CAPSULE | ORAL | 0 refills | Status: DC

## 2017-11-30 NOTE — Progress Notes (Signed)
Office: [phone]  /  Fax: [phone]   HPI:   Chief Complaint: OBESITY Susan [name] is here to discuss her progress with her obesity treatment plan. She is on the Category 2 plan +100 calories and is following her eating plan approximately 50 % of the time. She states she is walking 5,000 steps 4 times per week. Susan [name] did well with weight loss. She struggled to eat all her food. Hunger was controlled and she was able to eat her 100 calorie snacks, so she did not feel deprived. Her weight is 273 lb (123.8 kg) today and has had a weight loss of 5 pounds over a period of 2 weeks since her last visit. She has lost 5 lbs since starting treatment with [place].  Vitamin D deficiency Susan [name] has a diagnosis of vitamin D deficiency. Susan [name] is not on vit D and her level is not yet at goal. She admits fatigue and denies nausea, vomiting or muscle weakness.  At risk for osteopenia and osteoporosis Susan [name] is at higher risk of osteopenia and osteoporosis due to vitamin D deficiency.   Diabetes II Susan [name] has a diagnosis of diabetes type II. Susan [name] states fasting BGs and 2 hour post prandial BGs mostly range between 95 and 115 on metformin and with diet. She denies any hypoglycemic episodes. Last A1c was at 6.9 and the goal is to be below 6.5. Polyphagia is greatly improved on the category 2 diet prescription. She has been working on intensive lifestyle modifications including diet, exercise, and weight loss to help control her blood glucose levels.  ALLERGIES: No Known Allergies  MEDICATIONS: Current Outpatient Medications on File Prior to Visit  Medication Sig Dispense Refill  . DEXILANT 60 MG capsule Take 60 mg by mouth daily.   0  . fluticasone (FLONASE) 50 MCG/ACT nasal spray Place 2 sprays into both nostrils daily.     . hydrochlorothiazide (MICROZIDE) 12.5 MG capsule Take by mouth daily.  2  . lisinopril (PRINIVIL,ZESTRIL) 10 MG tablet Take 10 mg by mouth daily.  2  . metFORMIN (GLUCOPHAGE) 1000  MG tablet Take 500 mg by mouth 2 (two) times daily.   3  . metoprolol (LOPRESSOR) 25 MG tablet Take 1 tablet (25 mg total) by mouth 2 (two) times daily. 30 tablet 0  . montelukast (SINGULAIR) 10 MG tablet Take 10 mg by mouth at bedtime.    . promethazine (PHENERGAN) 25 MG tablet TAKE 1 TABLET(25 MG) BY MOUTH EVERY 6 HOURS AS NEEDED FOR NAUSEA OR VOMITING 30 tablet 0  . rosuvastatin (CRESTOR) 10 MG tablet Take 10 mg by mouth daily.    . tizanidine (ZANAFLEX) 2 MG capsule Take 2 mg by mouth 3 (three) times daily.     No current facility-administered medications on file prior to visit.     PAST MEDICAL HISTORY: Past Medical History:  Diagnosis Date  . Anxiety   . Diabetes mellitus ([place])   . Fluid retention   . GERD (gastroesophageal reflux disease)   . Heart murmur   . Herpes simplex without mention of complication   . History of herpes simplex infection 12/18/2013  . Hyperlipidemia   . Hypertension   . Ovarian cyst   . Reflux   . Sciatica   . SOB (shortness of breath)     PAST SURGICAL HISTORY: Past Surgical History:  Procedure Laterality Date  . DILATION AND CURETTAGE OF UTERUS    . ENDOMETRIAL ABLATION    . TUBAL LIGATION  2006    SOCIAL HISTORY:  Social History   Tobacco Use  . Smoking status: Never Smoker  . Smokeless tobacco: Never Used  Substance Use Topics  . Alcohol use: Yes    Comment: occ. 2 drinks/month  . Drug use: No    FAMILY HISTORY: Family History  Problem Relation Age of Onset  . Hypertension Maternal Grandmother   . Breast cancer Other   . Diabetes Mother   . High blood pressure Mother   . High Cholesterol Mother   . Diabetes Father   . High blood pressure Father   . High Cholesterol Father   . Alcoholism Father   . Obesity Father   . Colon cancer Maternal Grandfather     ROS: Review of Systems  Constitutional: Positive for malaise/fatigue and weight loss.  Gastrointestinal: Negative for nausea and vomiting.  Musculoskeletal:        Negative for muscle weakness  Endo/Heme/Allergies:       Positive for polyphagia Negative for hypoglycemia    PHYSICAL EXAM: Blood pressure 121/74, pulse 72, temperature 99 F (37.2 C), temperature source Oral, height 5' (1.524 m), weight 273 lb (123.8 kg), last menstrual period 11/02/2017, SpO2 99 %. Body mass index is 53.32 kg/m. Physical Exam  Constitutional: She is oriented to person, place, and time. She appears well-developed and well-nourished.  Cardiovascular: Normal rate.  Pulmonary/Chest: Effort normal.  Musculoskeletal: Normal range of motion.  Neurological: She is oriented to person, place, and time.  Skin: Skin is warm and dry.  Psychiatric: She has a normal mood and affect. Her behavior is normal.  Vitals reviewed.   RECENT LABS AND TESTS: BMET    Component Value Date/Time   NA 139 11/15/2017 0921   K 4.7 11/15/2017 0921   CL 102 11/15/2017 0921   CO2 22 11/15/2017 0921   GLUCOSE 107 (H) 11/15/2017 0921   GLUCOSE 125 (H) 08/11/2017 0706   BUN 11 11/15/2017 0921   CREATININE 0.82 11/15/2017 0921   CREATININE 0.79 09/21/2012 1515   CALCIUM 9.1 11/15/2017 0921   GFRNONAA 87 11/15/2017 0921   GFRAA 101 11/15/2017 0921   Lab Results  Component Value Date   HGBA1C 6.9 (H) 11/15/2017   HGBA1C 6.9 (H) 08/10/2017   Lab Results  Component Value Date   INSULIN 42.5 (H) 11/15/2017   CBC    Component Value Date/Time   WBC 6.6 11/15/2017 0921   WBC 6.4 08/11/2017 0706   RBC 4.50 11/15/2017 0921   RBC 4.63 08/11/2017 0706   HGB 12.0 11/15/2017 0921   HCT 37.5 11/15/2017 0921   PLT 260 08/11/2017 0706   MCV 83 11/15/2017 0921   MCH 26.7 11/15/2017 0921   MCH 26.6 08/11/2017 0706   MCHC 32.0 11/15/2017 0921   MCHC 31.4 08/11/2017 0706   RDW 16.9 (H) 11/15/2017 0921   LYMPHSABS 2.2 11/15/2017 0921   MONOABS 0.6 08/10/2017 0921   EOSABS 0.3 11/15/2017 0921   BASOSABS 0.0 11/15/2017 0921   Iron/TIBC/Ferritin/ %Sat No results found for: IRON, TIBC,  FERRITIN, IRONPCTSAT Lipid Panel     Component Value Date/Time   CHOL 132 11/15/2017 0921   TRIG 70 11/15/2017 0921   HDL 44 11/15/2017 0921   CHOLHDL 3.3 08/10/2017 1658   VLDL 16 08/10/2017 1658   LDLCALC 74 11/15/2017 0921   Hepatic Function Panel     Component Value Date/Time   PROT 6.9 11/15/2017 0921   ALBUMIN 3.7 11/15/2017 0921   AST 16 11/15/2017 0921   ALT 18 11/15/2017 0921   ALKPHOS  94 11/15/2017 0921   BILITOT <0.2 11/15/2017 0921      Component Value Date/Time   TSH 1.610 11/15/2017 0921   TSH 1.530 08/10/2017 1654   Results for SREYA, FROIO (MRN [id number]) as of 11/30/2017 09:57  Ref. Range 11/15/2017 09:21  Vitamin D, 25-Hydroxy Latest Ref Range: 30.0 - 100.0 ng/mL 29.0 (L)   ASSESSMENT AND PLAN: Vitamin D deficiency - Plan: Vitamin D, Ergocalciferol, (DRISDOL) 50000 units CAPS capsule  Type 2 diabetes mellitus without complication, without long-term current use of insulin (HCC)  At risk for osteoporosis  Class 3 severe obesity with serious comorbidity and body mass index (BMI) of 50.0 to 59.9 in adult, unspecified obesity type ([place])  PLAN:  Vitamin D Deficiency Faith was informed that low vitamin D levels contributes to fatigue and are associated with obesity, breast, and colon cancer. She agrees to start to take prescription Vit D @50 ,000 IU every week #4 with no refills and will follow up for routine testing of vitamin D, at least 2-3 times per year. She was informed of the risk of over-replacement of vitamin D and agrees to not increase her dose unless she discusses this with [place] first. Takhia agrees to follow up with our clinic in 2 to 3 weeks.  At risk for osteopenia and osteoporosis Onisha is at risk for osteopenia and osteoporosis due to her vitamin D deficiency. She was encouraged to take her vitamin D and follow her higher calcium diet and increase strengthening exercise to help strengthen her bones and decrease her risk of osteopenia and  osteoporosis.  Diabetes II Alexzandra has been given extensive diabetes education by myself today including ideal fasting and post-prandial blood glucose readings, individual ideal Hgb A1c goals and hypoglycemia prevention. We discussed the importance of good blood sugar control to decrease the likelihood of diabetic complications such as nephropathy, neuropathy, limb loss, blindness, coronary artery disease, and death. We discussed the importance of intensive lifestyle modification including diet, exercise and weight loss as the first line treatment for diabetes. Loletta agrees to continue metformin and diet. We may need to consider GLP-1 in the future. Kea agreed to follow up at the agreed upon time.  Obesity Michell is currently in the action stage of change. As such, her goal is to continue with weight loss efforts She has agreed to follow the Category 2 plan Amberle has been instructed to work up to a goal of 150 minutes of combined cardio and strengthening exercise per week for weight loss and overall health benefits. We discussed the following Behavioral Modification Strategies today: no skipping meals, increasing lean protein intake, decreasing simple carbohydrates  and work on meal planning and easy cooking plans  Ralene has agreed to follow up with our clinic in 2 to 3 weeks. She was informed of the importance of frequent follow up visits to maximize her success with intensive lifestyle modifications for her multiple health conditions.   OBESITY BEHAVIORAL INTERVENTION VISIT  Today's visit was # 2 out of 22.  Starting weight: 278 lbs Starting date: 11/15/17 Today's weight : 273 lbs Today's date: 11/29/2017 Total lbs lost to date: 5 (Patients must lose 7 lbs in the first 6 months to continue with counseling)   ASK: We discussed the diagnosis of obesity with Rolland Porter Pottle today and Dariela agreed to give [place] permission to discuss obesity behavioral modification therapy  today.  ASSESS: Joclynn has the diagnosis of obesity and her BMI today is 53.32 [place] is in the action stage  of change   ADVISE: Caylea was educated on the multiple health risks of obesity as well as the benefit of weight loss to improve her health. She was advised of the need for long term treatment and the importance of lifestyle modifications.  AGREE: Multiple dietary modification options and treatment options were discussed and  Jerrie agreed to the above obesity treatment plan.  I, Doreene Nest, am acting as transcriptionist for Dennard Nip, MD  I have reviewed the above documentation for accuracy and completeness, and I agree with the above. -Dennard Nip, MD

## 2017-12-20 ENCOUNTER — Encounter: Payer: Self-pay | Admitting: Family Medicine

## 2017-12-20 ENCOUNTER — Ambulatory Visit: Payer: BLUE CROSS/BLUE SHIELD | Admitting: Family Medicine

## 2017-12-20 ENCOUNTER — Other Ambulatory Visit: Payer: Self-pay

## 2017-12-20 VITALS — BP 138/72 | HR 70 | Temp 99.3°F | Resp 20 | Ht 60.0 in | Wt 274.1 lb

## 2017-12-20 DIAGNOSIS — Z23 Encounter for immunization: Secondary | ICD-10-CM

## 2017-12-20 DIAGNOSIS — E1169 Type 2 diabetes mellitus with other specified complication: Secondary | ICD-10-CM | POA: Diagnosis not present

## 2017-12-20 DIAGNOSIS — I10 Essential (primary) hypertension: Secondary | ICD-10-CM | POA: Diagnosis not present

## 2017-12-20 DIAGNOSIS — E785 Hyperlipidemia, unspecified: Secondary | ICD-10-CM

## 2017-12-20 DIAGNOSIS — Z1231 Encounter for screening mammogram for malignant neoplasm of breast: Secondary | ICD-10-CM | POA: Diagnosis not present

## 2017-12-20 DIAGNOSIS — Z1239 Encounter for other screening for malignant neoplasm of breast: Secondary | ICD-10-CM

## 2017-12-20 MED ORDER — LISINOPRIL 10 MG PO TABS: 10.0000 mg | ORAL_TABLET | Freq: Every day | ORAL | 0 refills | Status: DC

## 2017-12-20 MED ORDER — ROSUVASTATIN CALCIUM 10 MG PO TABS: 10.0000 mg | ORAL_TABLET | Freq: Every day | ORAL | 1 refills | Status: DC

## 2017-12-20 MED ORDER — METOPROLOL SUCCINATE ER 25 MG PO TB24: 25.0000 mg | ORAL_TABLET | Freq: Every day | ORAL | 1 refills | Status: DC

## 2017-12-20 NOTE — Progress Notes (Signed)
Patient ID: [name], female    DOB: 03/04/1971, 47 y.o.   MRN: [id number]  Chief Complaint  Patient presents with  . Establish Care    Allergies Patient has no known allergies.  Subjective:   Susan [name] is a 47 y.o. female who presents to [facility] today.  HPI [employer] presents as a new patient visit to establish care.  She works at [employer] and lives in this area.  She is married and has children.  She is followed by Dr. Glo Herring for her gynecologic needs.  She is also being seen at the [facility] health weight loss clinic in [place], [place].  She has another upcoming appointment. She has a history of diabetes, hyperlipidemia, and high blood pressure.  She reports that her blood sugars have been pretty well controlled.  She is due soon for diabetic eye exam.  She has never had any diabetic ulcers or lesions on her feet.  She denies any pain in her extremities.  She reports her sugars have been running well.  She takes metformin 500 mg twice a day.  She has a family history of diabetes.  She has no known kidney disease.  She denies any hypoglycemic episodes.  She has been working on her diet and trying to cut out sugar and carbohydrates.  She is doing some exercise.  She has a history of high cholesterol but reports that she has not been taking her medication.  She does not have any side effects with the medication but was never really compliant with taking it.  She reports that she has been prescribed metoprolol 25 mg twice a day, lisinopril 10 mg a day, and a fluid pill each day.  She reports that she regularly takes metoprolol twice a day.  However she is not compliant with the lisinopril or the fluid pill.  She denies any side effects with the medication.  She denies any chest pain, shortness of breath, swelling in her extremities, or palpitations.  Reports that she does take Dexilant and has been on the medication for many many  years.  Reports that she does not have heartburn or reflux.  She reports that she was told that she had reflux and was placed on the medication.  Has subsequently continued to take the medication for over 5 years.  Has never had an upper endoscopy.  Has no history of a GI bleed.  No dyspepsia.  No regurgitation of food.  Reports her bowel movements are normal.  Denies any melena or bright red blood per rectum.  Was placed on vitamin D by the weight loss clinic.  Has been taking prescription vitamin D each week. He is not taking B12 supplements. Has been seen by pulmonology because of questionable asthma.  Pulmonary function testing was done which was not indicative of asthma.  It was thought that she did have some restrictive lung disease most likely secondary to body habitus versus deconditioning.  She is working on losing weight.    Past Medical History:  Diagnosis Date  . Anxiety   . Diabetes mellitus ([place])   . Fluid retention   . GERD (gastroesophageal reflux disease)   . Heart murmur   . Herpes simplex without mention of complication   . History of herpes simplex infection 12/18/2013  . Hyperlipidemia   . Hypertension   . Ovarian cyst   . Reflux   . Sciatica   . SOB (shortness of breath)  Past Surgical History:  Procedure Laterality Date  . DILATION AND CURETTAGE OF UTERUS    . ENDOMETRIAL ABLATION    . TUBAL LIGATION  2006    Family History  Problem Relation Age of Onset  . Hypertension Maternal Grandmother   . Breast cancer Other   . Diabetes Mother   . High blood pressure Mother   . High Cholesterol Mother   . Diabetes Father   . High blood pressure Father   . High Cholesterol Father   . Alcoholism Father   . Obesity Father   . Hypertension Brother   . Hyperlipidemia Brother   . Lung cancer Paternal Grandmother   . Colon cancer Maternal Grandfather      Social History   Socioeconomic History  . Marital status: Married    Spouse name: [employer]   .  Number of children: 2  . Years of education: Not on file  . Highest education level: Not on file  Occupational History  . Occupation: Chartered certified accountant  Social Needs  . Financial resource strain: Not hard at all  . Food insecurity:    Worry: Never true    Inability: Never true  . Transportation needs:    Medical: No    Non-medical: No  Tobacco Use  . Smoking status: Never Smoker  . Smokeless tobacco: Never Used  Substance and Sexual Activity  . Alcohol use: Yes    Comment: occ. 2 drinks/month  . Drug use: No  . Sexual activity: Yes    Partners: Male    Birth control/protection: Surgical    Comment: btl and ablation  Lifestyle  . Physical activity:    Days per week: 3 days    Minutes per session: 40 min  . Stress: To some extent  Relationships  . Social connections:    Talks on phone: More than three times a week    Gets together: Once a week    Attends religious service: Never    Active member of club or organization: No    Attends meetings of clubs or organizations: Never    Relationship status: Married  Other Topics Concern  . Not on file  Social History Narrative   Married for over 14 years.   Works at [employer], works as a Designer, multimedia.   Has 2 children, 41, 45 years old.   Enjoys time with family and movies.    Current Outpatient Medications on File Prior to Visit  Medication Sig Dispense Refill  . fluticasone (FLONASE) 50 MCG/ACT nasal spray Place 2 sprays into both nostrils daily.     . metFORMIN (GLUCOPHAGE) 1000 MG tablet Take 500 mg by mouth 2 (two) times daily.  3  . montelukast (SINGULAIR) 10 MG tablet Take 10 mg by mouth at bedtime.    . promethazine (PHENERGAN) 25 MG tablet TAKE 1 TABLET(25 MG) BY MOUTH EVERY 6 HOURS AS NEEDED FOR NAUSEA OR VOMITING 30 tablet 0  . Vitamin D, Ergocalciferol, (DRISDOL) 50000 units CAPS capsule Take 1 capsule (50,000 Units total) by mouth every 7 (seven) days. 4 capsule 0   No current facility-administered medications on file prior to  visit.      Review of Systems  Constitutional: Negative for activity change, appetite change and fever.  Eyes: Negative for visual disturbance.  Respiratory: Negative for cough, chest tightness and shortness of breath.   Cardiovascular: Negative for chest pain, palpitations and leg swelling.  Gastrointestinal: Negative for abdominal pain, nausea and vomiting.  Genitourinary: Negative for dysuria,  frequency and urgency.  Neurological: Negative for dizziness, syncope and light-headedness.  Hematological: Negative for adenopathy.     Objective:   BP 138/72 (BP Location: Right Arm, Patient Position: Sitting, Cuff Size: Large)   Pulse 70   Temp 99.3 F (37.4 C) (Oral)   Resp 20   Ht 5' (1.524 m)   Wt 274 lb 1.3 oz (124.3 kg)   LMP 12/14/2017   SpO2 98%   BMI 53.53 kg/m   Physical Exam  Constitutional: She is oriented to person, place, and time. She appears well-developed and well-nourished. No distress.  HENT:  Head: Normocephalic and atraumatic.  Eyes: Pupils are equal, round, and reactive to light. Conjunctivae are normal. No scleral icterus.  Neck: Normal range of motion. Neck supple. No JVD present. No thyromegaly present.  Cardiovascular: Normal rate, regular rhythm and normal heart sounds.  Pulmonary/Chest: Effort normal and breath sounds normal. No respiratory distress.  Abdominal: Soft. Bowel sounds are normal.  Neurological: She is alert and oriented to person, place, and time. No cranial nerve deficit.  Skin: Skin is warm and dry.  Psychiatric: She has a normal mood and affect. Her behavior is normal. Judgment and thought content normal.  Nursing note and vitals reviewed.    Assessment and Plan  1. Type 2 diabetes mellitus with hyperlipidemia (HCC)  Hyperlipidemia and the associated risk of ASCVD were discussed today. Primary vs. Secondary prevention of ASCVD were discussed and how it relates to patient morbidity, mortality, and quality of life. Shared decision  making with patient including the risks of statins vs.benefits of ASCVD risk reduction discussed.  Risks of stains discussed including myopathy, rhabdomyoloysis, liver problems, increased risk of diabetes discussed. We discussed heart healthy diet, lifestyle modifications, risk factor modifications, and adherence to the recommended treatment plan. We discussed the need to periodically monitor lipid panel and liver function tests while on statin therapy.   LDL goal of less than 70 discussed.  We will plan to recheck labs in 3 months.  A1c reviewed.  Continue metformin 500 mg twice a day.  Call with any questions or concerns.  Continue visits for weight loss reduction. - rosuvastatin (CRESTOR) 10 MG tablet; Take 1 tablet (10 mg total) by mouth daily.  Dispense: 90 tablet; Refill: 1 - Ambulatory referral to Ophthalmology  2. Screening for breast cancer Ordered, due in June 2019.  - MM DIGITAL SCREENING BILATERAL; Future  3. Immunization due Ordered. Next pneumonia shot in 6 months.  - Tdap vaccine greater than or equal to 7yo IM - Pneumococcal conjugate vaccine 13-valent IM  4. Essential hypertension Lifestyle modifications discussed with patient including a diet emphasizing vegetables, fruits, and whole grains. Limiting intake of sodium to less than 2,400 mg per day.  Recommendations discussed include consuming low-fat dairy products, poultry, fish, legumes, non-tropical vegetable oils, and nuts; and limiting intake of sweets, sugar-sweetened beverages, and red meat. Discussed following a plan such as the Dietary Approaches to Stop Hypertension (DASH) diet. Patient to read up on this diet.   At this time will discontinue the diuretic.  Continue the lisinopril.  Change the metoprolol dosing to daily to promote compliance. Discussed with patient that we will need to check her BMP at 1 month for potassium and creatinine. Diet, exercise, and salt reduction recommended. - lisinopril  (PRINIVIL,ZESTRIL) 10 MG tablet; Take 1 tablet (10 mg total) by mouth daily.  Dispense: 90 tablet; Refill: 0 - metoprolol succinate (TOPROL XL) 25 MG 24 hr tablet; Take 1 tablet (25 mg  total) by mouth daily.  Dispense: 90 tablet; Refill: 1  At this time will discontinue Dexilant.  If patient develops any symptoms she will call or return to clinic. Start enteric-coated aspirin 81 mg p.o. Daily. Start B12 1000 mcg p.o. Daily. Records requested. Return in about 1 month (around 01/17/2018) for BP. Susan Macadam, MD 12/20/2017

## 2017-12-20 NOTE — Patient Instructions (Addendum)
Enteric Coated Aspirin, 81 mg, one a day  Start back on your cholesterol medicine. Take at night.  Start your BP medicine. Make sure your metformin pills are 500 mg.   We did change the metoprolol dose to the XL formulation, so it is only once a day now.   B12 1000 mcg a day, buy it OTC

## 2017-12-22 ENCOUNTER — Ambulatory Visit (INDEPENDENT_AMBULATORY_CARE_PROVIDER_SITE_OTHER): Payer: BLUE CROSS/BLUE SHIELD | Admitting: Family Medicine

## 2017-12-24 ENCOUNTER — Other Ambulatory Visit (INDEPENDENT_AMBULATORY_CARE_PROVIDER_SITE_OTHER): Payer: Self-pay | Admitting: Family Medicine

## 2017-12-24 DIAGNOSIS — E559 Vitamin D deficiency, unspecified: Secondary | ICD-10-CM

## 2017-12-27 ENCOUNTER — Encounter (INDEPENDENT_AMBULATORY_CARE_PROVIDER_SITE_OTHER): Payer: Self-pay

## 2017-12-27 ENCOUNTER — Ambulatory Visit (INDEPENDENT_AMBULATORY_CARE_PROVIDER_SITE_OTHER): Payer: BLUE CROSS/BLUE SHIELD | Admitting: Family Medicine

## 2018-01-02 ENCOUNTER — Ambulatory Visit (HOSPITAL_COMMUNITY): Payer: BLUE CROSS/BLUE SHIELD

## 2018-01-12 ENCOUNTER — Ambulatory Visit: Payer: BLUE CROSS/BLUE SHIELD | Admitting: Neurology

## 2018-01-12 ENCOUNTER — Encounter: Payer: Self-pay | Admitting: Neurology

## 2018-01-12 VITALS — BP 132/81 | HR 80 | Ht 60.0 in | Wt 272.5 lb

## 2018-01-12 DIAGNOSIS — R0601 Orthopnea: Secondary | ICD-10-CM

## 2018-01-12 DIAGNOSIS — G4730 Sleep apnea, unspecified: Secondary | ICD-10-CM

## 2018-01-12 DIAGNOSIS — G44021 Chronic cluster headache, intractable: Secondary | ICD-10-CM | POA: Diagnosis not present

## 2018-01-12 DIAGNOSIS — G4710 Hypersomnia, unspecified: Secondary | ICD-10-CM

## 2018-01-12 DIAGNOSIS — G4719 Other hypersomnia: Secondary | ICD-10-CM | POA: Diagnosis not present

## 2018-01-12 DIAGNOSIS — E662 Morbid (severe) obesity with alveolar hypoventilation: Secondary | ICD-10-CM

## 2018-01-12 DIAGNOSIS — R51 Headache: Secondary | ICD-10-CM | POA: Diagnosis not present

## 2018-01-12 DIAGNOSIS — E1165 Type 2 diabetes mellitus with hyperglycemia: Secondary | ICD-10-CM

## 2018-01-12 DIAGNOSIS — Z6841 Body Mass Index (BMI) 40.0 and over, adult: Secondary | ICD-10-CM | POA: Diagnosis not present

## 2018-01-12 DIAGNOSIS — R351 Nocturia: Secondary | ICD-10-CM

## 2018-01-12 DIAGNOSIS — R519 Headache, unspecified: Secondary | ICD-10-CM

## 2018-01-12 DIAGNOSIS — E66813 Morbid (severe) obesity with alveolar hypoventilation: Secondary | ICD-10-CM | POA: Insufficient documentation

## 2018-01-12 NOTE — Progress Notes (Signed)
SLEEP MEDICINE CLINIC   Provider:  Larey Seat, M.D.   Primary Care Physician:  Caren Macadam, MD   Referring Provider: Starlyn Skeans, MD    Chief Complaint  Patient presents with  . New Patient (Initial Visit)    Rm 11.     HPI:  Susan [name] is a 47 y.o. female , seen here as in a referral from Dr. Leafy Ro for a sleep study.  Mrs. Ghazarian reports snoring all her life. She snored while weighting a 120 pounds, and now with a BMI of over 50. Her snoring got louder. She reports waking up frequently, not always sure what wakes her, often GERD- she suspects.  Dr. Leafy Ro, her bariatric specialist, reported her heaviest ever was 278 pounds weight and she started  gaining weight after her second child.  She has significant food cravings, she often snacks snacks late at night, and sometimes she skips meals at work.  Portion control was also another point of deficiency.  She has been told that she Dr. snores but that she sometimes stops breathing.  She has reported a very high degree of daytime sleepiness with an Epworth sleepiness score of 18 points.  She also gets short of breath with exertion has limited exercise tolerance has become diabetic diagnosed after her HbA1c rose to 6.9 during the year 2018.    Daytime somnolence may be related to not sleeping enough at night she estimates only getting 5 hours of nocturnal sleep.  She has developed high blood pressure alongside with diabetes and hyperlipidemia.  A depression score PHQ 9 was endorsed at 14 points on 15 November 2017 I also reviewed her current medications, and  medical history : The patient carries a diagnosis of diabetes, fluid retention, GERD, heart murmur, history of herpes simplex infection in May 2015, hyperlipidemia, hypertension, ovarian cysts, uterine fibromas with heavy periods no before she underwent ablation, sciatica and shortness of breath.  She also has a family history positive for hypertension diabetes high blood  pressure high cholesterol in her mother and her father suffers from diabetes hypertension high cholesterol obesity and had struggled with alcoholism.   Sleep habits are as follows: she returns at 8 Pm after a 12 hour shift ( at [employer]) , takes time to speak to her children, and dinner at 9 PM. She tries to stay up until 10.30 to not have a full stomach in bed. She is not active, watches TV in that time.  She shares the bedroom with her husband. She will be right asleep once in her bed by 11.30 , the bedroom is neither cool, nor quiet - she runs the Tv in the back all night!  She has a flatt mattress, Tempropedic mattress- but sinks in, on her side - on 4-5 pillows, and otherwise sleeps in a recliner. She goes to sleep supine, and turns on her side later.  By 1 AM she is awake again after only 90 to 60 minutes of sleep, going to the bathroom.  This is the first of many possible breaks at night.  She almost wakes up on the hour every hour, sometimes due to coughing sometimes with the urge to urinate.  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  If it is not a work day, she can sleep until 6-6.30 am.  She wakes with a bad, dull headache every morning- this  can indicate hypoventilation. headaches wake her form sleep, too. She is not restored or refreshed,.   Sleep medical history and family sleep history: Patient's father was considered a loud snorer but never officially diagnosed with apnea.  She is also not aware of siblings have been diagnosed with apnea.   Social history: The patient is married, she is a Electrical engineer. she has 2 children the youngest 92 the oldest 79 years old, she works between 80 and 5 days of  12-hour shifts at [employer], Mrs. Lorella Nimrod children have their own TV in their respective bedrooms and also have the TV on at night.  She does not drink alcohol, she is a non-smoker, She  usually drinks 2 coffees at work and if not at work is not in the habit of drinking any caffeinated soda ice tea or coffee.  She also does not consume energy drinks.  Review of Systems: Out of a complete 14 system review, the patient complains of only the following symptoms, and all other reviewed systems are negative. Poor sleep hygiene, snoring, obesity, nocturia, coughing, heartburn, postnasal drip, allergic rhinitis.  Sleep related headaches, cluster headaches.  Epworth score 19/ 24  , Fatigue severity score 42   , depression score PHQ 9- 15 points.    Social History   Socioeconomic History  . Marital status: Married    Spouse name: [employer]   . Number of children: 2  . Years of education: Not on file  . Highest education level: Not on file  Occupational History  . Occupation: Chartered certified accountant  Social Needs  . Financial resource strain: Not hard at all  . Food insecurity:    Worry: Never true    Inability: Never true  . Transportation needs:    Medical: No    Non-medical: No  Tobacco Use  . Smoking status: Never Smoker  . Smokeless tobacco: Never Used  Substance and Sexual Activity  . Alcohol use: Yes    Comment: occ. 2 drinks/month  . Drug use: No  . Sexual activity: Yes    Partners: Male    Birth control/protection: Surgical    Comment: btl and ablation  Lifestyle  . Physical activity:    Days per week: 3 days    Minutes per session: 40 min  . Stress: To some extent  Relationships  . Social connections:    Talks on phone: More than three times a week    Gets together: Once a week    Attends religious service: Never    Active member of club or organization: No    Attends meetings of clubs or organizations: Never    Relationship status: Married  . Intimate partner violence:    Fear of current or ex partner: No    Emotionally abused: No    Physically abused: No    Forced sexual activity: No  Other Topics Concern  . Not on file  Social History Narrative    Married for over 14 years.   Works at [employer], works as a Designer, multimedia.   Has 2 children, 33, 62 years old.   Enjoys time with family and movies.     Family History  Problem Relation Age of Onset  . Hypertension Maternal Grandmother   . Breast cancer Other   . Diabetes Mother   . High blood pressure Mother   . High Cholesterol Mother   . Diabetes Father   . High blood pressure Father   . High Cholesterol Father   .  Alcoholism Father   . Obesity Father   . Hypertension Brother   . Hyperlipidemia Brother   . Lung cancer Paternal Grandmother   . Colon cancer Maternal Grandfather     Past Medical History:  Diagnosis Date  . Anxiety   . Diabetes mellitus ([place])   . Fluid retention   . GERD (gastroesophageal reflux disease)   . Heart murmur   . Herpes simplex without mention of complication   . History of herpes simplex infection 12/18/2013  . Hyperlipidemia   . Hypertension   . Ovarian cyst   . Reflux   . Sciatica   . SOB (shortness of breath)     Past Surgical History:  Procedure Laterality Date  . DILATION AND CURETTAGE OF UTERUS    . ENDOMETRIAL ABLATION    . TUBAL LIGATION  2006    Current Outpatient Medications  Medication Sig Dispense Refill  . lisinopril (PRINIVIL,ZESTRIL) 10 MG tablet Take 1 tablet (10 mg total) by mouth daily. 90 tablet 0  . metFORMIN (GLUCOPHAGE) 1000 MG tablet Take 500 mg by mouth 2 (two) times daily.  3  . metoprolol succinate (TOPROL XL) 25 MG 24 hr tablet Take 1 tablet (25 mg total) by mouth daily. 90 tablet 1  . montelukast (SINGULAIR) 10 MG tablet Take 10 mg by mouth at bedtime.    . rosuvastatin (CRESTOR) 10 MG tablet Take 1 tablet (10 mg total) by mouth daily. 90 tablet 1   No current facility-administered medications for this visit.     Allergies as of 01/12/2018  . (No Known Allergies)    Vitals: BP 132/81   Pulse 80   Ht 5' (1.524 m)   Wt 272 lb 8 oz (123.6 kg)   LMP 12/14/2017   BMI 53.22 kg/m  Last Weight:  Wt Readings from  Last 1 Encounters:  01/12/18 272 lb 8 oz (123.6 kg)   YYT:KPTW mass index is 53.22 kg/m.     Last Height:   Ht Readings from Last 1 Encounters:  01/12/18 5' (1.524 m)    Physical exam:  General: The patient is awake, alert and appears not in acute distress. The patient is well groomed. Head: Normocephalic, atraumatic. Neck is supple. Mallampati 5 - invisble  Uvula - wide tongue, not long.  neck circumference:17" . Nasal airflow Patent , . Retrognathia is not seen.  Cardiovascular:  Regular rate and rhythm , without  murmurs or carotid bruit, and without distended neck veins. Respiratory: Lungs are clear to auscultation. Skin:  Without evidence of edema, or rash Trunk: BMI is significantly elevated . The patient's posture is erect.   Neurologic exam : The patient is awake and alert, oriented to place and time.   Memory subjective described as intact.  Attention span & concentration ability appears normal.  Speech is fluent,  without dysarthria, dysphonia or aphasia.  Mood and affect are appropriate.  Cranial nerves: Pupils are equal and briskly reactive to light. Funduscopic exam without  evidence of pallor or edema. Extraocular movements  in vertical and horizontal planes intact and without nystagmus. Visual fields by finger perimetry are intact. Hearing to finger rub intact.  Facial sensation intact to fine touch. Facial motor strength is symmetric and tongue and uvula move midline. Shoulder shrug was symmetrical.   Motor exam:  Normal tone, muscle bulk and symmetric strength in all extremities. She reports myalgia in the proximal muscle groups.  Sensory:  Fine touch, pinprick and vibration were tested in all extremities. Proprioception tested  in the upper extremities was normal. Coordination: Rapid alternating movements in the fingers/hands was normal. Finger-to-nose maneuver  normal without evidence of ataxia, dysmetria or tremor. Gait and station: Patient walks without assistive  device .Tandem gait is unfragmented. Turns with  3  Steps. Romberg testing is negative. Deep tendon reflexes: in the  upper and lower extremities are symmetric and intact. Babinski maneuver response is downgoing.  Assessment:  After physical and neurologic examination, review of laboratory studies,  Personal review of imaging studies, reports of other /same  Imaging studies, results of polysomnography and / or neurophysiology testing and pre-existing records as far as provided in visit., my assessment is   1) excessive daytime sleepiness   2) orthopnoea   3) obesity hypoventilation with hypoxemia, possibly hypercapnia- sleep related headaches in AM ( morning headaches )  and cluster headaches.  4) reduced sleep time and poor sleep hygiene.    The patient was advised of the nature of the diagnosed disorder , the treatment options and the  risks for general health and wellness arising from not treating the condition.   I spent more than  50 minutes of face to face time with the patient.  Greater than 50% of time was spent in counseling and coordination of care. We have discussed the diagnosis and differential and I answered the patient's questions.    Plan:  Treatment plan and additional workup :  I am fairly sure this patient has OSA but also obesity hypoventilation, the cluster headaches are closely correlated with hypoxia and hypercapnia,  Hypoventilation results.     I will dedicate 30 minutes to sleep hygiene and insomnia teaching. I was quite shocked to hear that the 47 year old has already a TV in the bedroom, and runs it all night!   In bed before midnight I would like to put special emphasis on sleep to start before midnight in order to achieve the highest degree of delta sleep also called slow-wave sleep, which closely correlates with growth hormone secretions and insulin cleavage.  This may support her weight loss.  I ask for an attended SPLIT night with capnography.   Larey Seat, MD 6/64/4034, 7:42 AM  Certified in Neurology by ABPN Certified in [place] by [facility] [address] [place], [place] [address]

## 2018-01-12 NOTE — Patient Instructions (Signed)
Please remember to try to maintain good sleep hygiene, which means: Keep a regular sleep and wake schedule, try not to exercise or have a meal within 2 hours of your bedtime, try to keep your bedroom conducive for sleep, that is, cool and dark, without light distractors such as an illuminated alarm clock, and refrain from watching TV right before sleep or in the middle of the night and do not keep the TV or radio on during the night. Also, try not to use or play on electronic devices at bedtime, such as your cell phone, tablet PC or laptop. If you like to read at bedtime on an electronic device, try to dim the background light as much as possible. Do not eat in the middle of the night.   We will request a sleep study with CO2 measures- your headaches in AM and waking you indicate high Co2 and low 02 levels. .    We will look for leg twitching and snoring or sleep apnea.   For chronic insomnia, you are best followed by a psychiatrist and/or sleep psychologist.   We will call you with the sleep study results and make a follow up appointment.

## 2018-01-19 ENCOUNTER — Encounter: Payer: Self-pay | Admitting: Family Medicine

## 2018-01-19 ENCOUNTER — Other Ambulatory Visit: Payer: Self-pay

## 2018-01-19 ENCOUNTER — Ambulatory Visit: Payer: BLUE CROSS/BLUE SHIELD | Admitting: Family Medicine

## 2018-01-19 VITALS — BP 124/78 | HR 81 | Temp 98.5°F | Resp 16 | Ht 60.0 in | Wt 273.0 lb

## 2018-01-19 DIAGNOSIS — J301 Allergic rhinitis due to pollen: Secondary | ICD-10-CM

## 2018-01-19 DIAGNOSIS — E785 Hyperlipidemia, unspecified: Secondary | ICD-10-CM | POA: Diagnosis not present

## 2018-01-19 DIAGNOSIS — E1169 Type 2 diabetes mellitus with other specified complication: Secondary | ICD-10-CM

## 2018-01-19 DIAGNOSIS — Z6841 Body Mass Index (BMI) 40.0 and over, adult: Secondary | ICD-10-CM

## 2018-01-19 DIAGNOSIS — I10 Essential (primary) hypertension: Secondary | ICD-10-CM

## 2018-01-19 DIAGNOSIS — Z79899 Other long term (current) drug therapy: Secondary | ICD-10-CM | POA: Diagnosis not present

## 2018-01-19 DIAGNOSIS — E559 Vitamin D deficiency, unspecified: Secondary | ICD-10-CM | POA: Diagnosis not present

## 2018-01-19 MED ORDER — MONTELUKAST SODIUM 10 MG PO TABS: 10.0000 mg | ORAL_TABLET | Freq: Every day | ORAL | 1 refills | Status: DC

## 2018-01-19 MED ORDER — METFORMIN HCL 1000 MG PO TABS: 1000.0000 mg | ORAL_TABLET | Freq: Two times a day (BID) | ORAL | 1 refills | Status: DC

## 2018-01-19 NOTE — Progress Notes (Signed)
Patient ID: [name], female    DOB: 06-23-1971, 47 y.o.   MRN: [id number]  Chief Complaint  Patient presents with  . Hypertension    1 month follow up    Allergies Patient has no known allergies.  Subjective:   DEZARAI PREW is a 47 y.o. female who presents to [facility] today.  HPI Natalija presents for follow-up on her blood pressure.  Her last visit we changed her metoprolol dosing.  She was on a twice a day dosing and reported difficulty with compliance and overall pill burden.  Her medication was changed to Toprol-XL.  She reports she has not had any side effects with this medication.  She reports her blood pressures been running well.  She has lost 2 pounds.  She denies any chest pain, shortness of breath, swelling in her extremities.  Her hemoglobin A1c was checked at the last visit and it was a little higher than normal.  She has been taking metformin 1000 mg in the morning and 500 mg at night.  She denies any side effects.  Denies any hypoglycemic episodes.  She needs to get a foot exam performed today. Is not having any side effects with the cholesterol medication.  Denies any myalgias.  No palpitations.  No sores on her feet.  Bowel movements are regular.   Past Medical History:  Diagnosis Date  . Anxiety   . Diabetes mellitus ([place])   . Fluid retention   . GERD (gastroesophageal reflux disease)   . Heart murmur   . Herpes simplex without mention of complication   . History of herpes simplex infection 12/18/2013  . Hyperlipidemia   . Hypertension   . Ovarian cyst   . Reflux   . Sciatica   . SOB (shortness of breath)     Past Surgical History:  Procedure Laterality Date  . DILATION AND CURETTAGE OF UTERUS    . ENDOMETRIAL ABLATION    . TUBAL LIGATION  2006    Family History  Problem Relation Age of Onset  . Hypertension Maternal Grandmother   . Breast cancer Other   . Diabetes Mother   . High blood pressure Mother   . High Cholesterol  Mother   . Diabetes Father   . High blood pressure Father   . High Cholesterol Father   . Alcoholism Father   . Obesity Father   . Hypertension Brother   . Hyperlipidemia Brother   . Lung cancer Paternal Grandmother   . Colon cancer Maternal Grandfather      Social History   Socioeconomic History  . Marital status: Married    Spouse name: [employer]   . Number of children: 2  . Years of education: Not on file  . Highest education level: Not on file  Occupational History  . Occupation: Chartered certified accountant  Social Needs  . Financial resource strain: Not hard at all  . Food insecurity:    Worry: Never true    Inability: Never true  . Transportation needs:    Medical: No    Non-medical: No  Tobacco Use  . Smoking status: Never Smoker  . Smokeless tobacco: Never Used  Substance and Sexual Activity  . Alcohol use: Yes    Comment: occ. 2 drinks/month  . Drug use: No  . Sexual activity: Yes    Partners: Male    Birth control/protection: Surgical    Comment: btl and ablation  Lifestyle  . Physical activity:  Days per week: 3 days    Minutes per session: 40 min  . Stress: To some extent  Relationships  . Social connections:    Talks on phone: More than three times a week    Gets together: Once a week    Attends religious service: Never    Active member of club or organization: No    Attends meetings of clubs or organizations: Never    Relationship status: Married  Other Topics Concern  . Not on file  Social History Narrative   Married for over 14 years.   Works at [employer], works as a Designer, multimedia.   Has 2 children, 59, 41 years old.   Enjoys time with family and movies.    Current Outpatient Medications on File Prior to Visit  Medication Sig Dispense Refill  . lisinopril (PRINIVIL,ZESTRIL) 10 MG tablet Take 1 tablet (10 mg total) by mouth daily. 90 tablet 0  . metoprolol succinate (TOPROL XL) 25 MG 24 hr tablet Take 1 tablet (25 mg total) by mouth daily. 90 tablet 1  .  rosuvastatin (CRESTOR) 10 MG tablet Take 1 tablet (10 mg total) by mouth daily. 90 tablet 1   No current facility-administered medications on file prior to visit.     Review of Systems  Constitutional: Negative for activity change, appetite change and fever.  Eyes: Negative for visual disturbance.  Respiratory: Negative for cough, chest tightness and shortness of breath.   Cardiovascular: Negative for chest pain, palpitations and leg swelling.  Gastrointestinal: Negative for abdominal pain, nausea and vomiting.  Genitourinary: Negative for dysuria, frequency and urgency.  Neurological: Negative for dizziness, syncope and light-headedness.  Hematological: Negative for adenopathy.  Psychiatric/Behavioral: Negative for dysphoric mood.     Objective:   BP 124/78 (BP Location: Right Arm, Patient Position: Sitting, Cuff Size: Large)   Pulse 81   Temp 98.5 F (36.9 C) (Temporal)   Resp 16   Ht 5' (1.524 m)   Wt 273 lb (123.8 kg)   SpO2 96%   BMI 53.32 kg/m   Physical Exam  Constitutional: She is oriented to person, place, and time. She appears well-developed and well-nourished. No distress.  HENT:  Head: Normocephalic and atraumatic.  Eyes: Pupils are equal, round, and reactive to light.  Neck: Normal range of motion. Neck supple. No thyromegaly present.  Cardiovascular: Normal rate, regular rhythm and normal heart sounds.  Pulses:      Dorsalis pedis pulses are 2+ on the right side, and 2+ on the left side.  Pulmonary/Chest: Effort normal and breath sounds normal. No respiratory distress.  Feet:  Right Foot:  Protective Sensation: 8 sites tested. 8 sites sensed.  Skin Integrity: Negative for ulcer, blister, skin breakdown, erythema, warmth or callus.  Left Foot:  Protective Sensation: 8 sites tested. 8 sites sensed.  Skin Integrity: Negative for ulcer, blister, skin breakdown, erythema, warmth or callus.  Neurological: She is alert and oriented to person, place, and time. No  cranial nerve deficit.  Skin: Skin is warm and dry.  Nursing note and vitals reviewed.  Depression screen [facility] 2/9 01/19/2018 12/20/2017 11/15/2017 01/27/2017  Decreased Interest 0 1 2 0  Down, Depressed, Hopeless 0 0 2 0  PHQ - 2 Score 0 1 4 0  Altered sleeping - 3 3 -  Tired, decreased energy - 2 3 -  Change in appetite - 0 1 -  Feeling bad or failure about yourself  - 0 1 -  Trouble concentrating - 0 1 -  Moving slowly or fidgety/restless - 0 1 -  Suicidal thoughts - 0 0 -  PHQ-9 Score - 6 14 -  Difficult doing work/chores - Not difficult at all Somewhat difficult -    Assessment and Plan  1. Type 2 diabetes mellitus with hyperlipidemia (H[facility]) -We will increase metformin at this time.  We discussed her hemoglobin A1c goal.  Increase to 1000 mg twice a day.  Plan to recheck BMP at next lab draw. Patient counseled in detail regarding the risks of medication. Told to call or return to clinic if develop any worrisome signs or symptoms. Patient voiced understanding.  -Diabetic foot exam performed today.  Diabetic eye exam recommended. -Dietary modifications, exercise, and weight loss recommended today - Hemoglobin A1c - metFORMIN (GLUCOPHAGE) 1000 MG tablet; Take 1 tablet (1,000 mg total) by mouth 2 (two) times daily.  Dispense: 180 tablet; Refill: 1  2. Essential hypertension Blood pressure stable and improved.  Continue current medications. - Lipid panel  3. Vitamin D deficiency Continue current medications.  4. High risk medication use Continue medications. - Hepatic function panel  5. Hyperlipidemia associated with type 2 diabetes mellitus ([place]) Medication was added at last visit.  We will plan to check FLP in 2 months. - Lipid panel  6. Class 3 severe obesity with serious comorbidity and body mass index (BMI) of 50.0 to 59.9 in adult, unspecified obesity type [facility]) The patient is asked to make an attempt to improve diet and exercise patterns to aid in medical management of this  problem.   7. Seasonal allergic rhinitis due to pollen Medication refilled. Patient counseled in detail regarding the risks of medication. Told to call or return to clinic if develop any worrisome signs or symptoms. Patient voiced understanding.   - montelukast (SINGULAIR) 10 MG tablet; Take 1 tablet (10 mg total) by mouth at bedtime.  Dispense: 90 tablet; Refill: 1  Return in about 3 months (around 04/21/2018). Caren Macadam, MD 01/19/2018

## 2018-01-20 ENCOUNTER — Encounter: Payer: Self-pay | Admitting: Family Medicine

## 2018-01-30 ENCOUNTER — Encounter: Payer: Self-pay | Admitting: Family Medicine

## 2018-02-05 ENCOUNTER — Ambulatory Visit (INDEPENDENT_AMBULATORY_CARE_PROVIDER_SITE_OTHER): Payer: BLUE CROSS/BLUE SHIELD | Admitting: Neurology

## 2018-02-05 DIAGNOSIS — G4719 Other hypersomnia: Secondary | ICD-10-CM

## 2018-02-05 DIAGNOSIS — R351 Nocturia: Secondary | ICD-10-CM

## 2018-02-05 DIAGNOSIS — R519 Headache, unspecified: Secondary | ICD-10-CM

## 2018-02-05 DIAGNOSIS — E662 Morbid (severe) obesity with alveolar hypoventilation: Secondary | ICD-10-CM

## 2018-02-05 DIAGNOSIS — G4730 Sleep apnea, unspecified: Secondary | ICD-10-CM

## 2018-02-05 DIAGNOSIS — Z6841 Body Mass Index (BMI) 40.0 and over, adult: Secondary | ICD-10-CM

## 2018-02-05 DIAGNOSIS — G4710 Hypersomnia, unspecified: Secondary | ICD-10-CM

## 2018-02-05 DIAGNOSIS — E1165 Type 2 diabetes mellitus with hyperglycemia: Secondary | ICD-10-CM

## 2018-02-05 DIAGNOSIS — G4733 Obstructive sleep apnea (adult) (pediatric): Secondary | ICD-10-CM

## 2018-02-05 DIAGNOSIS — R0601 Orthopnea: Secondary | ICD-10-CM

## 2018-02-05 DIAGNOSIS — R51 Headache: Secondary | ICD-10-CM

## 2018-02-05 DIAGNOSIS — G44021 Chronic cluster headache, intractable: Secondary | ICD-10-CM

## 2018-02-21 ENCOUNTER — Other Ambulatory Visit: Payer: Self-pay | Admitting: Family Medicine

## 2018-02-21 DIAGNOSIS — I10 Essential (primary) hypertension: Secondary | ICD-10-CM

## 2018-02-23 ENCOUNTER — Encounter: Payer: Self-pay | Admitting: Family Medicine

## 2018-02-28 ENCOUNTER — Telehealth: Payer: Self-pay | Admitting: Family Medicine

## 2018-02-28 DIAGNOSIS — M79603 Pain in arm, unspecified: Secondary | ICD-10-CM

## 2018-02-28 NOTE — Addendum Note (Signed)
Addended by: Larey Seat on: 02/28/2018 06:32 PM   Modules accepted: Orders

## 2018-02-28 NOTE — Procedures (Signed)
PATIENT'S NAME:  Susan [name], Susan [name] DOB:      1971/01/25      MR#:    [id number]     DATE OF RECORDING: 02/05/2018 REFERRING M.D.:  Dennard Nip, MD Study Performed:  Split-Night Titration Study HISTORY:   47 year old female patient with morbid obesity, loud snoring and GERD, DM, fluid retention, hyperlipidemia, hypertension. She wakes up with headaches and reports fragmented sleep, nocturia almost hourly. She sleeps with a TV on. She snored when she was 120 pounds heavy, but could sleep through the night at that time.  The patient endorsed the Epworth Sleepiness Scale at 19/24 points   The patient's weight 272 pounds with a height of 60 (inches), resulting in a BMI of 53.2 kg/m2. The patient's neck circumference measured 17 inches. Epworth 19/24 points, Fatigue Severity Score (FSS) 42/63 points. PHQ 9 depression score 15 points.   CURRENT MEDICATIONS: Prinivil, Glucophage, Toprol XL, Singulair, Crestor.   PROCEDURE:  This is a multichannel digital polysomnogram utilizing the Somnostar 11.2 system.  Electrodes and sensors were applied and monitored per AASM Specifications.   EEG, EOG, Chin and Limb EMG, were sampled at 200 Hz.  ECG, Snore and Nasal Pressure, Thermal Airflow, Respiratory Effort, CPAP Flow and Pressure, Oximetry was sampled at 50 Hz. Digital video and audio were recorded.      BASELINE STUDY WITHOUT CPAP RESULTS: Lights Out was at 22:25 and Lights On at 05:00.  Total recording time (TRT) was 132, with a total sleep time (TST) of 125 minutes.  The patient's sleep latency was 2.5 minutes.  REM latency was 100.5 minutes.  The sleep efficiency was 94.7 %.    SLEEP ARCHITECTURE: WASO (Wake after sleep onset) was 0.5 minutes, Stage N1 was 1.5 minutes, Stage N2 was 31.5 minutes, Stage N3 was 79 minutes and Stage R (REM sleep) was 13 minutes.  The percentages were Stage N1 1.2%, Stage N2 25.2%, Stage N3 63.2% and Stage R (REM sleep) 10.4%.   RESPIRATORY ANALYSIS:  There were a total of 86  respiratory events:  5 obstructive apneas, 2 central apneas and 79 hypopneas with 0 respiratory event related arousals (RERAs). Snoring was noted.    The total APNEA/HYPOPNEA INDEX (AHI) was 41.3 /hour and the total RESPIRATORY DISTURBANCE INDEX was 41.3 /hour.   29 events occurred in REM sleep and 107 events in NREM. The REM AHI was 133.8 /hour versus a non-REM AHI of 30.5 /hour.  The patient spent 380.5 minutes sleep time in the supine position and 0 minutes in non-supine. The supine AHI was 41.3 /hour.  OXYGEN SATURATION & C02:  The wake baseline 02 saturation was 94%, with the lowest being 72%. Time spent below 89% saturation equaled 20 minutes. End Tidal CO2 during sleep was 38 torr. Peak End Tidal CO2 during REM sleep was 43 torr.  PERIODIC LIMB MOVEMENTS: The patient had a total of 0 Periodic Limb Movements.  The arousals were noted as: 9 were spontaneous, 0 were associated with PLMs, and 51 were associated with respiratory events. Audio and video analysis did not show any abnormal or unusual movements, behaviors, phonations or vocalizations.  The patient fell asleep and stayed asleep. The patient took no bathroom breaks. Snoring was noted EKG was in keeping with normal sinus rhythm (NSR)  TITRATION STUDY WITH CPAP RESULTS: CPAP was initiated at 5 cmH20 with heated humidity per AASM split night standards and pressure was advanced to 8 cmH20 because of hypopneas, apneas and desaturations.  At a PAP pressure  of 8 cmH20, there was a reduction of the AHI to 0.0 /hour.   Total recording time (TRT) for part 2 of this SPLIT study was 263.5 minutes, with a total sleep time (TST) of 255.5 minutes. The patient's sleep latency was 19 minutes. REM latency was 86.5 minutes.  The sleep efficiency was 97.1 %.    SLEEP ARCHITECTURE: Wake after sleep was 5.5 minutes, Stage N1 1 minutes, Stage N2 85 minutes, Stage N3 94 minutes and Stage R (REM sleep) 75.5 minutes. The percentages were: Stage N1 .4%, Stage N2  33.3%, Stage N3 36.8% and Stage R (REM sleep) 29.5%. The sleep architecture was notable for sustained and uninterrupted sleep. RESPIRATORY ANALYSIS:  There were 11 hypopneas. The total APNEA/HYPOPNEA INDEX (AHI) was 2.6 /hour and the total RESPIRATORY DISTURBANCE INDEX was 2.6 /hour. 3 events occurred in REM sleep and 8 events in NREM. The REM AHI was 2.4 /hour versus a non-REM AHI of 2.7 /hour. REM sleep was achieved on a pressure of 6 cm/H20.The patient spent 100% of total sleep time in the supine position. The supine AHI was 2.6 /hour, versus a non-supine AHI of 0.0/hour.  OXYGEN SATURATION & C02:  The wake baseline 02 saturation was 96%, with the lowest being 90%. Time spent below 89% saturation equaled 0 minutes.  PERIODIC LIMB MOVEMENTS:  The patient had a total of 7 Periodic Limb Movements. The Periodic Limb Movement (PLM) index was 1.6 /hour and the PLM Arousal index was 0.7 /hour.  Post-study, the patient indicated that sleep was much better than usual. The patient was fitted with a Simplus FFM in small size.   POLYSOMNOGRAPHY IMPRESSION :   1. Severe Obstructive Sleep Apnea (OSA) at AHI 41.3/h all supine. The patients REM AHI was 133.8/h.  2. No evidence of hypoxemia or hypercapnia. 3. No nocturia!   RECOMMENDATIONS: The patient responded well to CPAP at 8 cm water pressure.  A FFM from [employer], the" Simplus" mask in small size was used.  Sleep hygiene implementation ( light, TV and temperature)  has helped to increase the sleep efficiency. Weight loss is still advised to reduce apnea and may make CPAP unnecessary at some point.   A follow up appointment will be scheduled in the Sleep Clinic at [facility].      I certify that I have reviewed the entire raw data recording prior to the issuance of this report in accordance with the Standards of Accreditation of the American Academy of Sleep Medicine (AASM)    Larey Seat, M.D.   02-28-2018   Diplomat, American Board of Psychiatry and Neurology  Diplomat, [place] of Sleep Medicine Medical Director, [place] [facility] [employer]

## 2018-02-28 NOTE — Telephone Encounter (Signed)
Patient called in to request a referral orthopedic for right arm numbness/tingling. Now its painful for her grab things and the pain is shooting down her wrist & in her fingers. Cb#:  [phone] Requesting local ortho. In [place] ([place]

## 2018-03-01 ENCOUNTER — Telehealth: Payer: Self-pay | Admitting: Neurology

## 2018-03-01 NOTE — Telephone Encounter (Signed)
I called pt. I advised pt that Dr. Beacher May reviewed their sleep study results and found that pt has sleep apnea. Dr. Brett Fairy recommends that pt starts CPAP. I reviewed PAP compliance expectations with the pt. Pt is agreeable to starting a CPAP [facility], and [facility] will call the pt within about one week after they file with the pt's insurance. [facility] will show the pt how to use the machine, fit for masks, and troubleshoot the CPAP if needed. A follow up appt was made for insurance purposes with Dr.Dohmeier on Oct 14,2019 at 3:30 pm. Pt verbalized understanding to arrive 15 minutes early and bring their CPAP. A letter with all of this information in it will be mailed to the pt as a reminder. I verified with the pt that the address we have on file is correct. Pt verbalized understanding of results. Pt had no questions at this time but was encouraged to call back if questions arise.

## 2018-03-01 NOTE — Telephone Encounter (Signed)
-----   Message from Larey Seat, MD sent at 02/28/2018  6:32 PM EDT ----- POLYSOMNOGRAPHY IMPRESSION :   1. Severe Obstructive Sleep Apnea (OSA) at AHI 41.3/h all supine.  The patients REM AHI was 133.8/h.  2. No evidence of hypoxemia or hypercapnia. 3. No nocturia!  RECOMMENDATIONS: The patient responded well to CPAP at 8 cm water  pressure.  A FFM from [employer], the" Simplus" mask in small size  was used.  Sleep hygiene implementation ( light, TV and temperature control) has  helped to increase the sleep efficiency. Weight loss is still advised to reduce apnea and may make CPAP  unnecessary at some point.

## 2018-03-01 NOTE — Telephone Encounter (Signed)
I have sent this to Abby to call, and placed the referral but I did it to [place], if you can change it. Thanks.

## 2018-03-01 NOTE — Telephone Encounter (Signed)
Advise that referral has been placed to orthopedics. However, if she is having pain, weakness, and/or numbness in her arm that is new or worsening, she should go to the ED or [facility] to be evaluated. Please advise and find out more about what is going on. Gwen Her. Mannie Stabile, MD

## 2018-03-08 ENCOUNTER — Encounter (INDEPENDENT_AMBULATORY_CARE_PROVIDER_SITE_OTHER): Payer: Self-pay | Admitting: Orthopaedic Surgery

## 2018-03-08 ENCOUNTER — Ambulatory Visit (INDEPENDENT_AMBULATORY_CARE_PROVIDER_SITE_OTHER): Payer: BLUE CROSS/BLUE SHIELD

## 2018-03-08 ENCOUNTER — Ambulatory Visit (INDEPENDENT_AMBULATORY_CARE_PROVIDER_SITE_OTHER): Payer: BLUE CROSS/BLUE SHIELD | Admitting: Orthopaedic Surgery

## 2018-03-08 VITALS — BP 126/50 | HR 82 | Ht 60.0 in | Wt 271.0 lb

## 2018-03-08 DIAGNOSIS — M542 Cervicalgia: Secondary | ICD-10-CM | POA: Diagnosis not present

## 2018-03-08 DIAGNOSIS — M79601 Pain in right arm: Secondary | ICD-10-CM

## 2018-03-08 DIAGNOSIS — G4733 Obstructive sleep apnea (adult) (pediatric): Secondary | ICD-10-CM | POA: Diagnosis not present

## 2018-03-08 NOTE — Progress Notes (Signed)
Office Visit Note   Patient: Susan [name]           Date of Birth: 02/05/1971           MRN: [id number] Visit Date: 03/08/2018              Requested by: Caren Macadam, MD [address] [place], [place] [id number] PCP: Caren Macadam, MD   Assessment has had some very mild neck pain.  Numbness and tingling appears to start in the arm radiating to the hand rather than the opposite.  She is had difficulty at night and when she writes with numbness and tingling particularly in the radial 3 digits.  & Plan: Visit Diagnoses:  1. Neck pain   2. Pain of right upper extremity     Plan: Insidious onset of right upper extremity pain and tingling approximately a month ago 3 of injury or trauma.  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  I think there is a reasonable chance that she is experiencing carpal tunnel symptoms.  Will apply a volar wrist splint and try anti-inflammatory medicines for a month.  Have her return at that time and consider EMGs and nerve conduction studies  Follow-Up Instructions: Return in about 1 month (around 04/08/2018).   Orders:  Orders Placed This Encounter  Procedures  . XR Cervical Spine 2 or 3 views   No orders of the defined types were placed in this encounter.     Procedures: No procedures performed   Clinical Data: No additional findings.   Subjective: Chief Complaint  Patient presents with  . New Patient (Initial Visit)    R HAND/ARM PAIN AND NUMBNESS FOR 1 MO NO INJURY, WORSE DURING THE DAY   Insidious onset of right upper extremity pain and numbness approximately a month ago.  No injury or trauma.  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  She is had some mild stiffness in her neck.  She has pain in particular in the radial 3 digits right dominant hand sleeping and when she is involved in repetitive activities such as writing.  She is had some mild shoulder  pain. Patient is diabetic with a hemoglobin A1c about 6.8  HPI  Review of Systems  Constitutional: Negative for fatigue and fever.  HENT: Negative for ear pain.   Respiratory: Negative for cough and shortness of breath.   Cardiovascular: Negative for leg swelling.  Gastrointestinal: Negative for constipation and diarrhea.  Genitourinary: Negative for difficulty urinating.  Musculoskeletal: Negative for back pain and neck pain.  Skin: Negative for rash.  Allergic/Immunologic: Negative for food allergies.  Neurological: Positive for weakness and numbness.  Hematological: Does not bruise/bleed easily.  Psychiatric/Behavioral: Positive for sleep disturbance.     Objective: Vital Signs: BP (!) 126/50 (BP Location: Left Arm, Patient Position: Sitting, Cuff Size: Normal)   Pulse 82   Ht 5' (1.524 m)   Wt 271 lb (122.9 kg)   BMI 52.93 kg/m   Physical Exam  Constitutional: She is oriented to person, place, and time. She appears well-developed and well-nourished.  HENT:  Mouth/Throat: Oropharynx is clear and moist.  Eyes: Pupils are equal, round, and reactive to light. EOM are normal.  Pulmonary/Chest: Effort normal.  Neurological: She is alert and oriented to person, place, and time.  Skin: Skin is warm and dry.  Psychiatric: She has a normal mood and affect. Her behavior is normal.  Ortho Exam awake alert and oriented x3.  Comfortable sitting.  BMI of 53.  Negative Tinel's but positive Phalen's right upper extremity.  Good grip and good release.  Able to oppose thumb to little finger.  Good pulses.  No change in pulse spine.  No pain with range of motion of the cervical spine.  Able to touch her chin to her chest.  Had normal neck extension and rotation.  I could not reproduce the arm pain or numbness with any motion of the cervical spine or the right shoulder.  Full range of motion of the right shoulder with overhead activity.  Negative impingement.  Specialty Comments:  No  specialty comments available.  Imaging: Xr Cervical Spine 2 Or 3 Views  Result Date: 03/08/2018 Films of the cervical spine were obtained in AP and lateral projection.  There is straightening of the normal lordotic curve.  There is some arthritic changes at C5 6.  No listhesis.    PMFS History: Patient Active Problem List   Diagnosis Date Noted  . Class 3 obesity with alveolar hypoventilation, serious comorbidity, and body mass index (BMI) of 50.0 to 59.9 in adult ([place]) 01/12/2018  . Sleep related headaches 01/12/2018  . Intractable chronic cluster headache 01/12/2018  . Excessive daytime sleepiness 01/12/2018  . Sleeps in sitting position due to orthopnea 01/12/2018  . Other fatigue 11/15/2017  . Shortness of breath on exertion 11/15/2017  . Type 2 diabetes mellitus without complication, without long-term current use of insulin ([place]) 11/15/2017  . Essential hypertension 11/15/2017  . Other hyperlipidemia 11/15/2017  . Allergic rhinitis 10/12/2017  . DOE (dyspnea on exertion)   . Chest pain 08/10/2017  . Type 2 diabetes mellitus with hyperlipidemia ([place]) 08/10/2017  . Personal history of dysmenorrhea 03/10/2017  . Chronic hypertension 02/21/2017  . Dysmenorrhea 01/27/2017  . History of herpes simplex infection 12/18/2013  . Flank pain 09/21/2012  . GERD (gastroesophageal reflux disease) 09/21/2012  . Herpes genitalia 09/21/2012   Past Medical History:  Diagnosis Date  . Anxiety   . Diabetes mellitus ([place])   . Fluid retention   . GERD (gastroesophageal reflux disease)   . Heart murmur   . Herpes simplex without mention of complication   . History of herpes simplex infection 12/18/2013  . Hyperlipidemia   . Hypertension   . Ovarian cyst   . Reflux   . Sciatica   . SOB (shortness of breath)     Family History  Problem Relation Age of Onset  . Hypertension Maternal Grandmother   . Breast cancer Other   . Diabetes Mother   . High blood pressure Mother   . High  Cholesterol Mother   . Diabetes Father   . High blood pressure Father   . High Cholesterol Father   . Alcoholism Father   . Obesity Father   . Hypertension Brother   . Hyperlipidemia Brother   . Lung cancer Paternal Grandmother   . Colon cancer Maternal Grandfather     Past Surgical History:  Procedure Laterality Date  . DILATION AND CURETTAGE OF UTERUS    . ENDOMETRIAL ABLATION    . TUBAL LIGATION  2006   Social History   Occupational History  . Occupation: Chartered certified accountant  Tobacco Use  . Smoking status: Never Smoker  . Smokeless tobacco: Never Used  Substance and Sexual Activity  . Alcohol use: Yes    Comment: occ. 2 drinks/month  . Drug use: No  . Sexual activity: Yes  Partners: Male    Birth control/protection: Surgical    Comment: btl and ablation

## 2018-04-08 DIAGNOSIS — G4733 Obstructive sleep apnea (adult) (pediatric): Secondary | ICD-10-CM | POA: Diagnosis not present

## 2018-04-12 ENCOUNTER — Ambulatory Visit (INDEPENDENT_AMBULATORY_CARE_PROVIDER_SITE_OTHER): Payer: BLUE CROSS/BLUE SHIELD | Admitting: Orthopaedic Surgery

## 2018-04-21 ENCOUNTER — Ambulatory Visit: Payer: BLUE CROSS/BLUE SHIELD | Admitting: Family Medicine

## 2018-04-25 ENCOUNTER — Encounter: Payer: Self-pay | Admitting: Family Medicine

## 2018-04-25 ENCOUNTER — Other Ambulatory Visit: Payer: Self-pay

## 2018-04-25 ENCOUNTER — Ambulatory Visit: Payer: BLUE CROSS/BLUE SHIELD | Admitting: Family Medicine

## 2018-04-25 VITALS — BP 126/56 | HR 76 | Temp 97.7°F | Resp 12 | Ht 60.0 in | Wt 270.1 lb

## 2018-04-25 DIAGNOSIS — J301 Allergic rhinitis due to pollen: Secondary | ICD-10-CM | POA: Diagnosis not present

## 2018-04-25 DIAGNOSIS — E785 Hyperlipidemia, unspecified: Secondary | ICD-10-CM

## 2018-04-25 DIAGNOSIS — Z6841 Body Mass Index (BMI) 40.0 and over, adult: Secondary | ICD-10-CM

## 2018-04-25 DIAGNOSIS — I10 Essential (primary) hypertension: Secondary | ICD-10-CM | POA: Diagnosis not present

## 2018-04-25 DIAGNOSIS — Z23 Encounter for immunization: Secondary | ICD-10-CM

## 2018-04-25 DIAGNOSIS — E1169 Type 2 diabetes mellitus with other specified complication: Secondary | ICD-10-CM

## 2018-04-25 MED ORDER — METOPROLOL SUCCINATE ER 25 MG PO TB24: 25.0000 mg | ORAL_TABLET | Freq: Every day | ORAL | 1 refills | Status: DC

## 2018-04-25 MED ORDER — ROSUVASTATIN CALCIUM 10 MG PO TABS: 10.0000 mg | ORAL_TABLET | Freq: Every day | ORAL | 1 refills | Status: AC

## 2018-04-25 MED ORDER — METFORMIN HCL 1000 MG PO TABS: 1000.0000 mg | ORAL_TABLET | Freq: Two times a day (BID) | ORAL | 1 refills | Status: AC

## 2018-04-25 MED ORDER — MONTELUKAST SODIUM 10 MG PO TABS: 10.0000 mg | ORAL_TABLET | Freq: Every day | ORAL | 1 refills | Status: AC

## 2018-04-25 MED ORDER — LISINOPRIL 10 MG PO TABS: ORAL_TABLET | ORAL | 1 refills | Status: AC

## 2018-04-25 NOTE — Patient Instructions (Signed)
Schedule mammogram today. Get your labs Schedule your eye doctor appointment.

## 2018-04-25 NOTE — Progress Notes (Signed)
Patient ID: Susan [name], female    DOB: 01/15/1971, 47 y.o.   MRN: [id number]  Chief Complaint  Patient presents with  . Diabetes    follow up  . Hyperlipidemia    Allergies Patient has no known allergies.  Subjective:   Susan [name] is a 47 y.o. female who presents to [facility] today.  HPI Susan [name] presents for follow-up visit today.  Has been continuing to get between 5000-10,000 steps a day.  Is trying to stay under 2000 cal a day.  Has lost 3 pounds since her last visit.  Has some questions about weight loss medication which would decrease her appetite.  She is taking her metformin as directed.  Denies any chest pain, shortness of breath, swelling in her extremities, or palpitations.  Has gotten her CPAP since her last visit.  Reports her energy is improved and she is sleeping much better.  Reports her mood is good.  Taking her cholesterol medication each night.  No myalgias or side effects. Blood pressures been running well. No lesions on her feet.  Due to get her diabetic eye exam.  Would like to schedule her mammogram.  Seen by gynecology for Pap, pelvic, complete breast exam. No hypoglycemic episodes.  No side effects with her medications.   Past Medical History:  Diagnosis Date  . Anxiety   . Diabetes mellitus ([place])   . Fluid retention   . GERD (gastroesophageal reflux disease)   . Heart murmur   . Herpes simplex without mention of complication   . History of herpes simplex infection 12/18/2013  . Hyperlipidemia   . Hypertension   . Ovarian cyst   . Reflux   . Sciatica   . SOB (shortness of breath)     Past Surgical History:  Procedure Laterality Date  . DILATION AND CURETTAGE OF UTERUS    . ENDOMETRIAL ABLATION    . TUBAL LIGATION  2006    Family History  Problem Relation Age of Onset  . Hypertension Maternal Grandmother   . Breast cancer Other   . Diabetes Mother   . High blood pressure Mother   . High Cholesterol Mother   .  Diabetes Father   . High blood pressure Father   . High Cholesterol Father   . Alcoholism Father   . Obesity Father   . Hypertension Brother   . Hyperlipidemia Brother   . Lung cancer Paternal Grandmother   . Colon cancer Maternal Grandfather      Social History   Socioeconomic History  . Marital status: Married    Spouse name: [employer]   . Number of children: 2  . Years of education: Not on file  . Highest education level: Not on file  Occupational History  . Occupation: Chartered certified accountant  Social Needs  . Financial resource strain: Not hard at all  . Food insecurity:    Worry: Never true    Inability: Never true  . Transportation needs:    Medical: No    Non-medical: No  Tobacco Use  . Smoking status: Never Smoker  . Smokeless tobacco: Never Used  Substance and Sexual Activity  . Alcohol use: Yes    Comment: occ. 2 drinks/month  . Drug use: No  . Sexual activity: Yes    Partners: Male    Birth control/protection: Surgical    Comment: btl and ablation  Lifestyle  . Physical activity:    Days per week: 3 days  Minutes per session: 40 min  . Stress: To some extent  Relationships  . Social connections:    Talks on phone: More than three times a week    Gets together: Once a week    Attends religious service: Never    Active member of club or organization: No    Attends meetings of clubs or organizations: Never    Relationship status: Married  Other Topics Concern  . Not on file  Social History Narrative   Married for over 14 years.   Works at [employer], works as a Designer, multimedia.   Has 2 children, 62, 65 years old.   Enjoys time with family and movies.    No current outpatient medications on file prior to visit.   No current facility-administered medications on file prior to visit.     Review of Systems  Constitutional: Negative for activity change, appetite change, chills, diaphoresis, fatigue, fever and unexpected weight change.  HENT: Negative for trouble  swallowing and voice change.   Eyes: Negative for visual disturbance.  Respiratory: Negative for cough, chest tightness, shortness of breath and wheezing.   Cardiovascular: Negative for chest pain, palpitations and leg swelling.  Gastrointestinal: Negative for abdominal pain, nausea and vomiting.  Endocrine: Negative for polyphagia and polyuria.  Genitourinary: Negative for dysuria, frequency and urgency.  Musculoskeletal: Negative for myalgias.  Neurological: Negative for dizziness, syncope and light-headedness.  Hematological: Negative for adenopathy.  Psychiatric/Behavioral: Negative for dysphoric mood.     Objective:   BP (!) 126/56 (BP Location: Left Arm, Patient Position: Sitting, Cuff Size: Large)   Pulse 76   Temp 97.7 F (36.5 C) (Temporal)   Resp 12   Ht 5' (1.524 m)   Wt 270 lb 1.9 oz (122.5 kg)   SpO2 97%   BMI 52.75 kg/m   Physical Exam  Constitutional: She is oriented to person, place, and time. She appears well-developed and well-nourished. No distress.  HENT:  Head: Normocephalic and atraumatic.  Mouth/Throat: Oropharynx is clear and moist.  Eyes: Pupils are equal, round, and reactive to light. Conjunctivae are normal.  Neck: Normal range of motion. Neck supple. No JVD present. No tracheal deviation present. No thyromegaly present.  Cardiovascular: Normal rate, regular rhythm and normal heart sounds.  Pulmonary/Chest: Effort normal and breath sounds normal. No respiratory distress.  Lymphadenopathy:    She has no cervical adenopathy.  Neurological: She is alert and oriented to person, place, and time. No cranial nerve deficit.  Skin: Skin is warm and dry. Capillary refill takes less than 2 seconds.  Psychiatric: She has a normal mood and affect. Her behavior is normal. Judgment and thought content normal.  Nursing note and vitals reviewed.  Depression screen [facility] 2/9 04/25/2018 01/19/2018 12/20/2017 11/15/2017 01/27/2017  Decreased Interest 0 0 1 2 0  Down,  Depressed, Hopeless 0 0 0 2 0  PHQ - 2 Score 0 0 1 4 0  Altered sleeping - - 3 3 -  Tired, decreased energy - - 2 3 -  Change in appetite - - 0 1 -  Feeling bad or failure about yourself  - - 0 1 -  Trouble concentrating - - 0 1 -  Moving slowly or fidgety/restless - - 0 1 -  Suicidal thoughts - - 0 0 -  PHQ-9 Score - - 6 14 -  Difficult doing work/chores - - Not difficult at all Somewhat difficult -    Assessment and Plan  1. Need for immunization against influenza Vaccination given.  Pneumonia shots up-to-date. - Flu Vaccine QUAD 36+ mos IM  2. Essential hypertension Stable.  Continue current medications.  Check BMP today. - Basic metabolic panel - lisinopril (PRINIVIL,ZESTRIL) 10 MG tablet; TAKE 1 TABLET(10 MG) BY MOUTH DAILY  Dispense: 90 tablet; Refill: 1 - metoprolol succinate (TOPROL XL) 25 MG 24 hr tablet; Take 1 tablet (25 mg total) by mouth daily.  Dispense: 90 tablet; Refill: 1  3. Type 2 diabetes mellitus with hyperlipidemia (HCC) LDL at goal. Hyperlipidemia and the associated risk of ASCVD were discussed today. Primary vs. Secondary prevention of ASCVD were discussed and how it relates to patient morbidity, mortality, and quality of life. Shared decision making with patient including the risks of statins vs.benefits of ASCVD risk reduction discussed.  Risks of stains discussed including myopathy, rhabdomyoloysis, liver problems, increased risk of diabetes discussed. We discussed heart healthy diet, lifestyle modifications, risk factor modifications, and adherence to the recommended treatment plan. We discussed the need to periodically monitor lipid panel and liver function tests while on statin therapy.   - Hemoglobin A1c - metFORMIN (GLUCOPHAGE) 1000 MG tablet; Take 1 tablet (1,000 mg total) by mouth 2 (two) times daily.  Dispense: 180 tablet; Refill: 1 - rosuvastatin (CRESTOR) 10 MG tablet; Take 1 tablet (10 mg total) by mouth daily.  Dispense: 90 tablet; Refill: 1  4.  Class 3 severe obesity with serious comorbidity and body mass index (BMI) of 50.0 to 59.9 in adult, unspecified obesity type (HCC) Diet, exercise, and weight loss modifications discussed.  We did discuss risks versus benefits of weight loss medications today.  Patient deferred initiation of weight loss medications due to the cardiovascular risk associated with them.  She will continue to monitor her diet.  She will try to decrease her calories to 1200 to 1500 cal a day.  She will continue to keep her exercise level for approximately 5 days a week. -Follow-up for weight loss check in 2 months. 5. Seasonal allergic rhinitis due to pollen Allergy avoidance discussed.  Continue medication.  Call with any questions or concerns. - montelukast (SINGULAIR) 10 MG tablet; Take 1 tablet (10 mg total) by mouth at bedtime.  Dispense: 90 tablet; Refill: 1  No follow-ups on file.  Follow-up with new PCP in 2 to 3 months or sooner if needed.  Call with any questions or concerns.  Medications refilled today.  Patient was asked to schedule her mammogram and schedule her eye appointment. Caren Macadam, MD 04/25/2018

## 2018-05-09 DIAGNOSIS — G4733 Obstructive sleep apnea (adult) (pediatric): Secondary | ICD-10-CM | POA: Diagnosis not present

## 2018-05-28 ENCOUNTER — Encounter: Payer: Self-pay | Admitting: Neurology

## 2018-05-29 ENCOUNTER — Ambulatory Visit: Payer: BLUE CROSS/BLUE SHIELD | Admitting: Neurology

## 2018-05-29 ENCOUNTER — Encounter: Payer: Self-pay | Admitting: Neurology

## 2018-05-29 VITALS — BP 141/79 | HR 86 | Ht 59.0 in | Wt 274.0 lb

## 2018-05-29 DIAGNOSIS — Z9989 Dependence on other enabling machines and devices: Secondary | ICD-10-CM

## 2018-05-29 DIAGNOSIS — Z6841 Body Mass Index (BMI) 40.0 and over, adult: Secondary | ICD-10-CM

## 2018-05-29 DIAGNOSIS — G4733 Obstructive sleep apnea (adult) (pediatric): Secondary | ICD-10-CM | POA: Insufficient documentation

## 2018-05-29 NOTE — Progress Notes (Signed)
SLEEP MEDICINE CLINIC   Provider:  Larey [name], M.D.   Primary Care Physician:  Susan Macadam, MD   Referring Provider: Dr Susan [name], medical weight and wellness.    Chief Complaint  Patient presents with  . Follow-up    pt here for first initial cpap visit. pt alone, rm 11. pt states that there are times where water comes back into the mask. she states pretty much every night she will wake up wipe it out with a paper towel  and then puts it back on- can go back to sleep-. wanted to question how to help that.     HPI:  Susan [name] is a 47 y.o. female , had been seen  here as in a referral from Dr. Leafy [name]  for a sleep study. She is now following up on May 29, 2018.  The patient underwent a split-night polysomnography which I had ordered based on her BMI at the time of 43.2, a very high degree of daytime sleepiness endorsed the Epworth Sleepiness Scale at 19 out of 24 points, and the witnessed snoring that have been a lifelong phenomenon for her.  The AHI of the split-night polysomnography from 05 February 2018 was 41.3 during REM sleep exacerbated 233.8/h.  In supine sleep there was no further accentuation as the patient slept all night on her back.  The peak end-tidal CO2 during REM sleep was 43 torr there was no evidence of prolonged hypercapnia, she did have hypoxemia for a total time of 20 minutes at or below 88% saturation.  No periodic limb movements she was started to be titrated to CPAP at 5 cm water pressure and step-by-step increased to 8.  At 8 cmH2O pressure her AHI was significantly reduced, she did no longer have periodic limb movements, she did not barely snore she responded the best at 8 cmH2O pressure with a full facemask, the [employer] Simplus mask and small size.  Her mask has allowed her to keep her mouth closed during the night, we have also discussed sleep hygiene implementations.   My hope is that weight loss will continue and makes a CPAP at one time no  longer necessary.    At this time however she demonstrates a use of CPAP at 29 out of 30 days which is 97%, but only 23 of those days over 4 hours total compliance of 77%, average user time 5 hours 22 minutes, set pressure at 8 cmH2O with 3 cm EPR, AHI residual is 2.7 which is a good resolution no central apneas arising.    Her Epworth Sleepiness Scale has been reduced to 6 out of 24 points and her fatigue severity to 25/63 points.  Especially for the Epworth sleepiness score this is a reduction from previously 19 points to 6 points.  The patient feels that she has more daytime alertness, able to concentrate, her sleep is less fragmented and she is overall more awake and alert. No more headaches (!).   Mrs. Susan [name] reports snoring all her life. She snored while weighting a 120 pounds, and now with a BMI of over 50. Her snoring got louder. She reports waking up frequently, not always sure what wakes her, often GERD- she suspects.  Dr. Leafy [name], her bariatric specialist, reported her heaviest ever was 278 pounds weight and she started  gaining weight after her second child.  She has significant food cravings, she often snacks snacks late at night, and sometimes she skips meals at work.  Portion control  was also another point of deficiency.  She has been told that she Dr. snores but that she sometimes stops breathing.  She has reported a very high degree of daytime sleepiness with an Epworth sleepiness score of 18 points.  She also gets short of breath with exertion has limited exercise tolerance has become diabetic diagnosed after her HbA1c rose to 6.9 during the year 2018.   Daytime somnolence may be related to not sleeping enough at night she estimates only getting 5 hours of nocturnal sleep.  She has developed high blood pressure alongside with diabetes and hyperlipidemia.  A depression score PHQ 9 was endorsed at 14 points on 15 November 2017 I also reviewed her current medications, and  medical history : The  patient carries a diagnosis of diabetes, fluid retention, GERD, heart murmur, history of herpes simplex infection in May 2015, hyperlipidemia, hypertension, ovarian cysts, uterine fibromas with heavy periods no before she underwent ablation, sciatica and shortness of breath.  She also has a family history positive for hypertension diabetes high blood pressure high cholesterol in her mother and her father suffers from diabetes hypertension high cholesterol obesity and had struggled with alcoholism.   Sleep habits are as follows: she returns at 8 Pm after a 12 hour shift ( at [employer]) , takes time to speak to her children, and dinner at 9 PM. She tries to stay up until 10.30 to not have a full stomach in bed. She is not active, watches TV in that time.  She shares the bedroom with her husband. She will be right asleep once in her bed by 11.30 , the bedroom is neither cool, nor quiet - she runs the Tv in the back all night!  She has a flatt mattress, Tempropedic mattress- but sinks in, on her side - on 4-5 pillows, and otherwise sleeps in a recliner. She goes to sleep supine, and turns on her side later.  By 1 AM she is awake again after only 90 to 60 minutes of sleep, going to the bathroom.  This is the first of many possible breaks at night.  She almost wakes up on the hour every hour, sometimes due to coughing sometimes with the urge to urinate.  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  If it is not a work day, she can sleep until 6-6.30 am.  She wakes with a bad, dull headache every morning- this can indicate hypoventilation. headaches wake her form sleep, too. She is not restored or refreshed,.   Sleep medical history and family sleep history: Patient's father was considered a loud snorer but never officially diagnosed with apnea.  She is also not aware of siblings have been diagnosed with apnea.   Social history:  The patient is married, she is a Electrical engineer. she has 2 children the youngest 28 the oldest 45 years old, she works between 51 and 5 days of  12-hour shifts at [employer], Mrs. Lorella Nimrod children have their own TV in their respective bedrooms and also have the TV on at night.  She does not drink alcohol, she is a non-smoker, She usually drinks 2 coffees at work and if not at work is not in the habit of drinking any caffeinated soda ice tea or coffee.  She also does not consume energy drinks.  Review of Systems: Out of a complete 14 system review,  the patient complains of only the following symptoms, and all other reviewed systems are negative. Poor sleep hygiene, snoring, obesity, nocturia, coughing, heartburn, postnasal drip, allergic rhinitis.  Sleep related headaches, cluster headaches.  Epworth score 19/ 24  , Fatigue severity score 42   , depression score PHQ 9- 15 points.    Social History   Socioeconomic History  . Marital status: Married    Spouse name: [employer]   . Number of children: 2  . Years of education: Not on file  . Highest education level: Not on file  Occupational History  . Occupation: Chartered certified accountant  Social Needs  . Financial resource strain: Not hard at all  . Food insecurity:    Worry: Never true    Inability: Never true  . Transportation needs:    Medical: No    Non-medical: No  Tobacco Use  . Smoking status: Never Smoker  . Smokeless tobacco: Never Used  Substance and Sexual Activity  . Alcohol use: Yes    Comment: occ. 2 drinks/month  . Drug use: No  . Sexual activity: Yes    Partners: Male    Birth control/protection: Surgical    Comment: btl and ablation  Lifestyle  . Physical activity:    Days per week: 3 days    Minutes per session: 40 min  . Stress: To some extent  Relationships  . Social connections:    Talks on phone: More than three times a week    Gets together: Once a week    Attends religious service: Never     Active member of club or organization: No    Attends meetings of clubs or organizations: Never    Relationship status: Married  . Intimate partner violence:    Fear of current or ex partner: No    Emotionally abused: No    Physically abused: No    Forced sexual activity: No  Other Topics Concern  . Not on file  Social History Narrative   Married for over 14 years.   Works at [employer], works as a Designer, multimedia.   Has 2 children, 60, 51 years old.   Enjoys time with family and movies.     Family History  Problem Relation Age of Onset  . Hypertension Maternal Grandmother   . Breast cancer Other   . Diabetes Mother   . High blood pressure Mother   . High Cholesterol Mother   . Diabetes Father   . High blood pressure Father   . High Cholesterol Father   . Alcoholism Father   . Obesity Father   . Hypertension Brother   . Hyperlipidemia Brother   . Lung cancer Paternal Grandmother   . Colon cancer Maternal Grandfather     Past Medical History:  Diagnosis Date  . Anxiety   . Diabetes mellitus ([place])   . Fluid retention   . GERD (gastroesophageal reflux disease)   . Heart murmur   . Herpes simplex without mention of complication   . History of herpes simplex infection 12/18/2013  . Hyperlipidemia   . Hypertension   . Ovarian cyst   . Reflux   . Sciatica   . SOB (shortness of breath)     Past Surgical History:  Procedure Laterality Date  . DILATION AND CURETTAGE OF UTERUS    . ENDOMETRIAL ABLATION    . TUBAL LIGATION  2006    Current Outpatient Medications  Medication Sig Dispense Refill  . lisinopril (PRINIVIL,ZESTRIL) 10 MG tablet TAKE  1 TABLET(10 MG) BY MOUTH DAILY 90 tablet 1  . metFORMIN (GLUCOPHAGE) 1000 MG tablet Take 1 tablet (1,000 mg total) by mouth 2 (two) times daily. 180 tablet 1  . metoprolol succinate (TOPROL XL) 25 MG 24 hr tablet Take 1 tablet (25 mg total) by mouth daily. 90 tablet 1  . montelukast (SINGULAIR) 10 MG tablet Take 1 tablet (10 mg total) by mouth  at bedtime. 90 tablet 1  . rosuvastatin (CRESTOR) 10 MG tablet Take 1 tablet (10 mg total) by mouth daily. 90 tablet 1   No current facility-administered medications for this visit.     Allergies as of 05/29/2018  . (No Known Allergies)    Vitals: BP (!) 141/79   Pulse 86   Ht 4\' 11"  (1.499 m)   Wt 274 lb (124.3 kg)   BMI 55.34 kg/m  Last Weight:  Wt Readings from Last 1 Encounters:  05/29/18 274 lb (124.3 kg)   XVQ:MGQQ mass index is 55.34 kg/m.     Last Height:   Ht Readings from Last 1 Encounters:  05/29/18 4\' 11"  (1.499 m)    Physical exam:  General: The patient is awake, alert and appears not in acute distress. The patient is well groomed. Head: Normocephalic, atraumatic. Neck is supple. Mallampati 5 - invisble  Uvula - wide tongue, not long.  neck circumference:17" . Nasal airflow Patent , . Retrognathia is not seen.  Cardiovascular:  Regular rate and rhythm , without  murmurs or carotid bruit, and without distended neck veins. Respiratory: Lungs are clear to auscultation. Skin:  Without evidence of edema, or rash Trunk: BMI is significantly elevated . The patient's posture is erect.   Neurologic exam : The patient is awake and alert, oriented to place and time.   Memory subjective described as intact.  Attention span & concentration ability appears normal.  Speech is fluent,  without dysarthria, dysphonia or aphasia.  Mood and affect are appropriate.  Cranial nerves: Pupils are equal and briskly reactive to light. Funduscopic exam without  evidence of pallor or edema. Extraocular movements  in vertical and horizontal planes intact and without nystagmus. Visual fields by finger perimetry are intact. Hearing to finger rub intact.  Facial sensation intact to fine touch. Facial motor strength is symmetric and tongue and uvula move midline. Shoulder shrug was symmetrical.   Motor exam:  Normal tone, muscle bulk and symmetric strength in all extremities. She reports  myalgia in the proximal muscle groups.  Sensory:  Fine touch, pinprick and vibration were tested in all extremities. Proprioception tested in the upper extremities was normal. Coordination: Rapid alternating movements in the fingers/hands was normal. Finger-to-nose maneuver  normal without evidence of ataxia, dysmetria or tremor. Gait and station: Patient walks without assistive device .Tandem gait is unfragmented. Turns with  3  Steps. Romberg testing is negative. Deep tendon reflexes: in the  upper and lower extremities are symmetric and intact. Babinski maneuver response is downgoing.  Assessment:  After physical and neurologic examination, review of laboratory studies,  Personal review of imaging studies, reports of other /same  Imaging studies, results of polysomnography and / or neurophysiology testing and pre-existing records as far as provided in visit., my assessment is   1) excessive daytime sleepiness with  orthopnoea , severest OSA- treated a under CPAP at 8 cm water   Resolved are EDS fatigue and  sleep related headaches in AM ( morning headaches )  and cluster headaches.    The patient was advised of  the nature of the diagnosed disorder , the treatment options and the  risks for general health and wellness arising from not treating the condition.   I spent more than 20 minutes of face to face time with the patient.  Greater than 50% of time was spent in counseling and coordination of care. We have discussed the diagnosis and differential and I answered the patient's questions.    Plan:  Treatment plan and additional workup : continue CPAP, yearly RV with N P   Susan Seat, MD 50/72/2575, 0:51 PM  Certified in Neurology by ABPN Certified in [place] by [facility] [address] [place], [place] [address]

## 2018-06-08 DIAGNOSIS — G4733 Obstructive sleep apnea (adult) (pediatric): Secondary | ICD-10-CM | POA: Diagnosis not present

## 2018-07-09 DIAGNOSIS — G4733 Obstructive sleep apnea (adult) (pediatric): Secondary | ICD-10-CM | POA: Diagnosis not present

## 2018-07-28 DIAGNOSIS — R509 Fever, unspecified: Secondary | ICD-10-CM | POA: Diagnosis not present

## 2018-07-28 DIAGNOSIS — R6889 Other general symptoms and signs: Secondary | ICD-10-CM | POA: Diagnosis not present

## 2018-07-28 DIAGNOSIS — J069 Acute upper respiratory infection, unspecified: Secondary | ICD-10-CM | POA: Diagnosis not present

## 2018-08-08 DIAGNOSIS — G4733 Obstructive sleep apnea (adult) (pediatric): Secondary | ICD-10-CM | POA: Diagnosis not present

## 2018-09-08 DIAGNOSIS — G4733 Obstructive sleep apnea (adult) (pediatric): Secondary | ICD-10-CM | POA: Diagnosis not present

## 2018-09-20 ENCOUNTER — Other Ambulatory Visit: Payer: Self-pay | Admitting: Adult Health

## 2018-09-29 ENCOUNTER — Encounter: Payer: Self-pay | Admitting: Emergency Medicine

## 2018-09-29 ENCOUNTER — Emergency Department
Admission: EM | Admit: 2018-09-29 | Discharge: 2018-09-29 | Disposition: A | Discharge status: Home / Self Care | Payer: PRIVATE HEALTH INSURANCE | Attending: Emergency Medicine | Admitting: Emergency Medicine

## 2018-09-29 ENCOUNTER — Emergency Department: Payer: PRIVATE HEALTH INSURANCE

## 2018-09-29 DIAGNOSIS — Y990 Civilian activity done for income or pay: Secondary | ICD-10-CM | POA: Diagnosis not present

## 2018-09-29 DIAGNOSIS — X501XXA Overexertion from prolonged static or awkward postures, initial encounter: Secondary | ICD-10-CM | POA: Diagnosis not present

## 2018-09-29 DIAGNOSIS — S3992XA Unspecified injury of lower back, initial encounter: Secondary | ICD-10-CM | POA: Diagnosis present

## 2018-09-29 DIAGNOSIS — Y9389 Activity, other specified: Secondary | ICD-10-CM | POA: Insufficient documentation

## 2018-09-29 DIAGNOSIS — S39012A Strain of muscle, fascia and tendon of lower back, initial encounter: Secondary | ICD-10-CM | POA: Diagnosis not present

## 2018-09-29 DIAGNOSIS — Z7984 Long term (current) use of oral hypoglycemic drugs: Secondary | ICD-10-CM | POA: Insufficient documentation

## 2018-09-29 DIAGNOSIS — E119 Type 2 diabetes mellitus without complications: Secondary | ICD-10-CM | POA: Insufficient documentation

## 2018-09-29 DIAGNOSIS — I10 Essential (primary) hypertension: Secondary | ICD-10-CM | POA: Insufficient documentation

## 2018-09-29 DIAGNOSIS — Z79899 Other long term (current) drug therapy: Secondary | ICD-10-CM | POA: Diagnosis not present

## 2018-09-29 DIAGNOSIS — Y929 Unspecified place or not applicable: Secondary | ICD-10-CM | POA: Diagnosis not present

## 2018-09-29 LAB — POCT PREGNANCY, URINE: Preg Test, Ur: NEGATIVE

## 2018-09-29 MED ORDER — METHOCARBAMOL 500 MG PO TABS: 500.0000 mg | ORAL_TABLET | Freq: Four times a day (QID) | ORAL | 0 refills | Status: DC

## 2018-09-29 MED ORDER — KETOROLAC TROMETHAMINE 30 MG/ML IJ SOLN
30.0000 mg | Freq: Once | INTRAMUSCULAR | Status: AC
  Administered 2018-09-29: 30 mg via INTRAMUSCULAR
  Filled 2018-09-29: qty 1

## 2018-09-29 MED ORDER — ETODOLAC 500 MG PO TABS: 500.0000 mg | ORAL_TABLET | Freq: Two times a day (BID) | ORAL | 0 refills | Status: DC

## 2018-09-29 NOTE — ED Notes (Signed)
Waiting on nursing supervisor to return page for UDS or BAT need. Pt updated regarding status.

## 2018-09-29 NOTE — ED Provider Notes (Signed)
[facility] Emergency Department Provider Note  ____________________________________________  Time seen: Approximately 10:40 PM  I have reviewed the triage vital signs and the nursing notes.   HISTORY  Chief Complaint Back Pain    HPI Susan [name] is a 48 y.o. female who presents the emergency department complaining of right lower back pain after work injury.  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  Patient reports that she felt a pulling sensation in her lower back.  She has had a history of sciatica in the past and states that she is now also experiencing sciatica-like symptoms down the right lower extremity.  Patient denies any bowel or bladder dysfunction, saddle anesthesia or paresthesias.  No medications for his complaint prior to arrival.  Prior to this incident patient was not having sciatic complaints.  Patient denies any headache, neck pain, GI or GU complaints.    Past Medical History:  Diagnosis Date  . Anxiety   . Diabetes mellitus ([place])   . Fluid retention   . GERD (gastroesophageal reflux disease)   . Heart murmur   . Herpes simplex without mention of complication   . History of herpes simplex infection 12/18/2013  . Hyperlipidemia   . Hypertension   . Ovarian cyst   . Reflux   . Sciatica   . SOB (shortness of breath)     Patient Active Problem List   Diagnosis Date Noted  . Morbid obesity with body mass index (BMI) of 50.0 to 59.9 in adult ([place]) 05/29/2018  . OSA on CPAP 05/29/2018  . Class 3 obesity with alveolar hypoventilation, serious comorbidity, and body mass index (BMI) of 50.0 to 59.9 in adult ([place]) 01/12/2018  . Sleep related headaches 01/12/2018  . Intractable chronic cluster headache 01/12/2018  . Excessive daytime sleepiness 01/12/2018  . Sleeps in sitting position due to orthopnea 01/12/2018  . Other fatigue  11/15/2017  . Shortness of breath on exertion 11/15/2017  . Type 2 diabetes mellitus without complication, without long-term current use of insulin ([place]) 11/15/2017  . Essential hypertension 11/15/2017  . Other hyperlipidemia 11/15/2017  . Allergic rhinitis 10/12/2017  . DOE (dyspnea on exertion)   . Chest pain 08/10/2017  . Type 2 diabetes mellitus with hyperlipidemia ([place]) 08/10/2017  . Personal history of dysmenorrhea 03/10/2017  . Chronic hypertension 02/21/2017  . Dysmenorrhea 01/27/2017  . History of herpes simplex infection 12/18/2013  . Flank pain 09/21/2012  . GERD (gastroesophageal reflux disease) 09/21/2012  . Herpes genitalia 09/21/2012    Past Surgical History:  Procedure Laterality Date  . DILATION AND CURETTAGE OF UTERUS    . ENDOMETRIAL ABLATION    . TUBAL LIGATION  2006    Prior to Admission medications   Medication Sig Start Date End Date Taking? Authorizing Provider  etodolac (LODINE) 500 MG tablet Take 1 tablet (500 mg total) by mouth 2 (two) times daily. 09/29/18   Ayari Liwanag, Charline Bills, PA-C  lisinopril (PRINIVIL,ZESTRIL) 10 MG tablet TAKE 1 TABLET(10 MG) BY MOUTH DAILY 04/25/18   Caren Macadam, MD  metFORMIN (GLUCOPHAGE) 1000 MG tablet Take 1 tablet (1,000 mg total) by mouth 2 (two) times daily. 04/25/18   Caren Macadam, MD  methocarbamol (ROBAXIN) 500 MG tablet Take 1 tablet (500 mg total) by mouth 4 (four) times daily. 09/29/18   Alayzha An, Charline Bills, PA-C  metoprolol succinate (TOPROL XL) 25 MG 24 hr tablet  Take 1 tablet (25 mg total) by mouth daily. 04/25/18   Caren Macadam, MD  montelukast (SINGULAIR) 10 MG tablet Take 1 tablet (10 mg total) by mouth at bedtime. 04/25/18   Caren Macadam, MD  rosuvastatin (CRESTOR) 10 MG tablet Take 1 tablet (10 mg total) by mouth daily. 04/25/18   Caren Macadam, MD  valACYclovir (VALTREX) 1000 MG tablet TAKE 1 TABLET BY MOUTH TWICE DAILY 09/21/18   Estill Dooms, NP    Allergies Patient has no known  allergies.  Family History  Problem Relation Age of Onset  . Hypertension Maternal Grandmother   . Breast cancer Other   . Diabetes Mother   . High blood pressure Mother   . High Cholesterol Mother   . Diabetes Father   . High blood pressure Father   . High Cholesterol Father   . Alcoholism Father   . Obesity Father   . Hypertension Brother   . Hyperlipidemia Brother   . Lung cancer Paternal Grandmother   . Colon cancer Maternal Grandfather     Social History Social History   Tobacco Use  . Smoking status: Never Smoker  . Smokeless tobacco: Never Used  Substance Use Topics  . Alcohol use: Yes    Comment: occ. 2 drinks/month  . Drug use: No     Review of Systems  Constitutional: No fever/chills Eyes: No visual changes.  Cardiovascular: no chest pain. Respiratory: no cough. No SOB. Gastrointestinal: No abdominal pain.  No nausea, no vomiting.  Musculoskeletal: Positive for right lower back pain with mild radicular symptoms down the right lower leg. Skin: Negative for rash, abrasions, lacerations, ecchymosis. Neurological: Negative for headaches, focal weakness or numbness. 10-point ROS otherwise negative.  ____________________________________________   PHYSICAL EXAM:  VITAL SIGNS: ED Triage Vitals [09/29/18 2013]  Enc Vitals Group     BP 119/67     Pulse Rate 72     Resp 18     Temp 97.8 F (36.6 C)     Temp Source Oral     SpO2 99 %     Weight      Height      Head Circumference      Peak Flow      Pain Score      Pain Loc      Pain Edu?      Excl. in [place]?      Constitutional: Alert and oriented. Well appearing and in no acute distress. Eyes: Conjunctivae are normal. PERRL. EOMI. Head: Atraumatic. Neck: No stridor.  No cervical spine tenderness to palpation.  Cardiovascular: Normal rate, regular rhythm. Normal S1 and S2.  Good peripheral circulation. Respiratory: Normal respiratory effort without tachypnea or retractions. Lungs CTAB. Good air  entry to the bases with no decreased or absent breath sounds. Musculoskeletal: Full range of motion to all extremities. No gross deformities appreciated.  No visible signs of trauma to the lumbar spine.  Patient is mildly tender to palpation midline diffusely as well as right paraspinal muscle group.  No tenderness to palpation of the left paraspinal muscle group.  Mild tenderness to palpation of the right-sided sciatic notch and none over the left.  Negative straight leg raise bilaterally.  Dorsalis pedis pulse intact bilateral lower extremities.  Sensation intact and equal in all dermatomal distributions bilateral lower extremities. Neurologic:  Normal speech and language. No gross focal neurologic deficits are appreciated.  Skin:  Skin is warm, dry and intact. No rash noted. Psychiatric: Mood and affect are normal.  Speech and behavior are normal. Patient exhibits appropriate insight and judgement.   ____________________________________________   LABS (all labs ordered are listed, but only abnormal results are displayed)  Labs Reviewed  POC URINE PREG, ED  POCT PREGNANCY, URINE   ____________________________________________  EKG   ____________________________________________  RADIOLOGY I personally viewed and evaluated these images as part of my medical decision making, as well as reviewing the written report by the radiologist.  I concur with radiologist finding of no acute osseous abnormality of the lumbar spine.  Dg Lumbar Spine 2-3 Views  Result Date: 09/29/2018 CLINICAL DATA:  Low back pain after injury EXAM: LUMBAR SPINE - 2-3 VIEW COMPARISON:  CT AP 01/25/2008 and chest radiograph 08/10/2017 FINDINGS: Riblets of L1 are identified for numbering purposes. Lumbosacral transitional vertebral anatomy with assimilation joint noted on the right with S1-S2 disc. Maintained lumbar lordosis. No acute fracture, pars defects or listhesis. Disc spaces are maintained. IMPRESSION: 1. No acute  osseous abnormality of the lumbar spine. 2. Lumbosacral transitional vertebral anatomy with assimilation joint on the right involving S1. Electronically Signed   By: Ashley Royalty M.D.   On: 09/29/2018 21:57    ____________________________________________    PROCEDURES  Procedure(s) performed:    Procedures    Medications  ketorolac (TORADOL) 30 MG/ML injection 30 mg (30 mg Intramuscular Given 09/29/18 2258)     ____________________________________________   INITIAL IMPRESSION / ASSESSMENT AND PLAN / ED COURSE  Pertinent labs & imaging results that were available during my care of the patient were reviewed by me and considered in my medical decision making (see chart for details).  Review of the  CSRS was performed in accordance of the [place] prior to dispensing any controlled drugs.      Patient's diagnosis is consistent with lumbar strain.  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  Overall, exam was reassuring.  X-ray reveals no acute findings.  Symptoms and exam are most consistent with lumbar strain.  Patient will be given [place] but no Norflex as she does not have a driver home.  Patient will be prescribed etodolac and Robaxin for symptom relief.  Work restrictions for 1 week in regards to heavy lifting.  Follow-up with primary care as needed. Patient is given ED precautions to return to the ED for any worsening or new symptoms.     ____________________________________________  FINAL CLINICAL IMPRESSION(S) / ED DIAGNOSES  Final diagnoses:  Strain of lumbar region, initial encounter      NEW MEDICATIONS STARTED DURING THIS VISIT:  ED Discharge Orders         Ordered    etodolac (LODINE) 500 MG tablet  2 times daily     09/29/18 2241    methocarbamol (ROBAXIN) 500 MG tablet  4 times daily     09/29/18 2241              This chart was dictated using voice recognition software/Dragon.  Despite best efforts to proofread, errors can occur which can change the meaning. Any change was purely unintentional.    Darletta Moll, PA-C 09/29/18 2351    Carrie Mew, MD 10/09/18 [phone]

## 2018-09-29 NOTE — ED Triage Notes (Signed)
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 Pt c/o lower back pain that radiates up the right side of back.

## 2018-09-29 NOTE — ED Notes (Signed)
Phone conversation with [facility] in reference to urine drug screen being required. AC states no testing of any kind is required for worker's comp.

## 2018-09-29 NOTE — ED Notes (Signed)
[facility] contacted, no further WC testing required.

## 2018-10-09 DIAGNOSIS — G4733 Obstructive sleep apnea (adult) (pediatric): Secondary | ICD-10-CM | POA: Diagnosis not present

## 2018-10-20 ENCOUNTER — Telehealth (HOSPITAL_COMMUNITY): Payer: Self-pay

## 2018-10-20 NOTE — Telephone Encounter (Signed)
Media tab/ [employer] Comp Claim #[id number] DOI:09/19/2018 Approved eval and 6 visits scanned in Media Tab.

## 2018-10-26 ENCOUNTER — Other Ambulatory Visit: Payer: Self-pay

## 2018-10-26 ENCOUNTER — Encounter (HOSPITAL_COMMUNITY): Payer: Self-pay

## 2018-10-26 ENCOUNTER — Ambulatory Visit (HOSPITAL_COMMUNITY): Payer: PRIVATE HEALTH INSURANCE | Attending: Family Medicine

## 2018-10-26 DIAGNOSIS — M5441 Lumbago with sciatica, right side: Secondary | ICD-10-CM | POA: Insufficient documentation

## 2018-10-26 DIAGNOSIS — R293 Abnormal posture: Secondary | ICD-10-CM | POA: Diagnosis present

## 2018-10-26 NOTE — Therapy (Signed)
[facility] [address] [place], [place], [address] Phone: [phone]   Fax:  [phone]  Physical Therapy Evaluation  Patient Details  Name: Susan [name] MRN: [id number] Date of Birth: 1971/01/10 Referring Provider (PT): Maximino Sarin, FNP   Encounter Date: 10/26/2018   PT End of Session - 10/26/18 1704   Visit Number 1  Number of Visits 7  Date for PT Re-Evaluation 11/16/18  Authorization  Authorization Type Worker's Comp Claim #[id number] DOI:09/19/2018 Approved eval and 6 visits   Authorization Time Period 10/26/2018 - 11/16/2018  Authorization - Visit Number 0  Authorization - Number of Visits 6  PT Time Calculation  PT Start Time 1350  PT Stop Time 1430  PT Time Calculation (min) 40 min  PT - End of Session  Activity Tolerance Patient tolerated treatment well  Behavior During Therapy [facility] for tasks assessed/performed    Past Medical History:  Diagnosis Date  . Anxiety   . Diabetes mellitus ([place])   . Fluid retention   . GERD (gastroesophageal reflux disease)   . Heart murmur   . Herpes simplex without mention of complication   . History of herpes simplex infection 12/18/2013  . Hyperlipidemia   . Hypertension   . Ovarian cyst   . Reflux   . Sciatica   . SOB (shortness of breath)     Past Surgical History:  Procedure Laterality Date  . DILATION AND CURETTAGE OF UTERUS    . ENDOMETRIAL ABLATION    . TUBAL LIGATION  2006    There were no vitals filed for this visit.    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 She reports she was going through the check in process when the patient jumped out of bed to reach for a phone. She states the patient she was assisting was NWB on one UE and in an immobilizer on one LE so she was unable to balance safely independently. Ms. Pro reached out for the  patient and grabbed the patient to prevent a fall. Ms. Wuest reports feeling a pop in her back and has had a pulling sensation in her Rt lower back since the event. She states no acute findings were seen on imaging and they prescribed her a muscle relaxer and pain medication. She is no longer taking the muscle relaxer. Ms. Gault reports initially she had some tingling in her Rt foot and states she has experienced sciatica in the past but reports it is getting better and denies symptoms below her knee. She states she is on light duty at work now and reports bending over aggravates her back even when performing tasks like taking vitals on patients.  Limitations Other (comment) (bending forward)  How long can you sit comfortably? back gets stiff if she doesn't get up to stretch   How long can you stand comfortably? back gets stiff if after prolonged standing  How long can you walk comfortably? 15 minutes  Patient Stated Goals to alleviated the "pushing pain" in her lower back  Pain Assessment  Currently in Pain? Yes  Pain Score 5  Pain Location Back  Pain Orientation Right;Lower  Pain Descriptors / Indicators Aching  Pain Type Acute pain (sub-acute)  Pain Radiating Towards some pain down her Rt posterolateral leg and some up to ~ T8 in thoracic spine on Rt side  Pain Onset 1  to 4 weeks ago  Pain Frequency Intermittent  Aggravating Factors  bending forward, walking for prolonged periods, being still prolonged periods  Pain Relieving Factors heat  Effect of Pain on Daily Activities mild to moderate      [facility] PT Assessment - 10/26/18 0001   Medical Diagnosis Low Back Pain  Referring Provider (PT) Maximino Sarin, FNP  Onset Date/Surgical Date 09/29/18  Next MD Visit 11/06/2018  Prior Therapy none  Precautions  Precautions None  Restrictions  Weight Bearing Restrictions No  Balance Screen  Has the patient fallen in the past 6 months No  Has the patient had a decrease in activity  level because of a fear of falling?  No  Is the patient reluctant to leave their home because of a fear of falling?  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 She is currently on light duty and is not required tophysically assist patients at this time.  Leisure pt used towalk for exercise  Cognition  Overall Cognitive Status Within Functional Limits for tasks assessed  Observation/Other Assessments  Focus on Therapeutic Outcomes (FOTO)  59% limited  ROM / Strength  AROM / PROM / Strength AROM;Strength  AROM  Overall AROM Comments LUmbar quadrants tested: pt reports pulling sensation with front Lt and Rt quadrants  AROM Assessment Site Lumbar  Lumbar Flexion 40 (painful "pulling sensation")  Lumbar Extension 20  Lumbar - Right Side Bend 25 (painful "pulling sensation")  Lumbar - Left Side Bend 20 (painful "pulling sensation" on Rt (worse than Rt SB))  Strength  Strength Assessment Site Hip  Right Hip Flexion 4+/5  Right Hip Extension 4+/5  Right Hip ABduction 4/5  Right Hip ADduction 5/5  Left Hip Flexion 4+/5  Left Hip Extension 4+/5  Left Hip ABduction 4/5  Left Hip ADduction 5/5  Palpation  Spinal mobility PA's to T8-L5. Patient with reports of tenderness at L5 and at thoracolumbar junction.   SI assessment  tenderness at Rt PSIS  Palpation comment tenderness to gluteus maximus on Rt   Special Tests   Special Tests Lumbar;Sacrolliac Tests;Hip Special Tests  Lumbar Tests FABER  test;other  Sacroiliac Tests  Sacral Compression  Hip Special Tests  Other  FABER test  findings Negative  Side Rt  other  Findings Negative  Side  Rt  Comments FADDIR  Pelvic Dictraction  Findings Negative  Side  Rt  Pelvic Compression  Findings Positive  Side Rt  Sacral thrust   Findings Positive  Side Rt  Gaenslen's test  Findings Negative  Side  Rt  Sacral Compression  Findings Positive  Side  Rt  other  Findings Negative  Side Rt;Lt  Comments Bil Hip ROM is WNL's throughout and PROM with overpressure does not provocate patients symptoms     Objective measurements completed on examination: See above findings.      [facility] PT Treatment/Exercise - 10/26/18 0001   Exercises Lumbar  Lumbar Exercises: Stretches  Standing Extension 10 reps;10 seconds  Standing Extension Limitations provided for HEP  PT Education - 10/26/18 1752     Education Details Educated on exam findings and apporpriate POC as well as visit approval based on workman's comp. Educated on initial HEP.   Person(s) Educated  Patient    Methods  Explanation;Handout    Comprehension  Verbalized understanding       PT Short Term Goals - 10/26/18 1751      PT SHORT TERM GOAL #1   Title  Patient will be independent with HEP, updated PRN, to progress lumbar extensor strengthening and tolerance to flexion postures.    Time  1    Period  Weeks    Status  New    Target Date  11/02/18        PT Long Term Goals - 10/26/18 1751      PT LONG TERM GOAL #1   Title  Patient will improve FOTO score to 45% or less to achieve significant improvement in self reported limitation related to back paind during work and functional daily activitites.    Time  3    Period  Weeks    Status  New    Target Date  11/16/18      PT LONG TERM GOAL #2   Title  Patient will perform lumbar AROM testing with no pain provocation to indicate reduced irritability.    Time  3    Period  Weeks    Status  New      PT  LONG TERM GOAL #3   Title  Patient will perform 10 reps of forward flexion with no increase in low back pain or symptom provocation to indicate reduce irritability.    Time  3    Period  Weeks    Status  New      PT LONG TERM GOAL #4   Title  Patient will demonstrate proper lifting mechanics to improve posture and reduce risk of injury at work environment.    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    Time  3    Period  Weeks    Status  New        Plan - 10/26/18 1750   Clinical Impression Statement  Ms. Votaw presents to physical therapy for evaluation of acute low back pain. She reports acute onset of pain after assisting a patient at work to prevent them from falling. She reports hearing and feeling a pop in her lower back when she reached out and caught the patient and now presents with reduced tolerance to flexion postures. Patient reports relief with lumbar extension and denies any neurologic symptoms distally. She tested positive for 3/5 on the [place] SIJ cluster indicating she may have some sacroilliac dysfunction and she reports in the past she has experienced sciatic nerve symptoms down her Rt LE. All hip testing was negative indicating her symptoms are related to her lumbar spine and potentially SIJ region. Ms. Clawson will benefit from skilled PT interventions to address impairments and progress towards goals to return to full work duties with reduced pain and reduced risk of injury.  Personal Factors and Comorbidities Comorbidity 3+  Comorbidities Hypertension, Diabetes Mellitus, Hyperlipidemia, Anxiety  Examination-Activity Limitations Lift;Bend  Examination-Participation Restrictions Other (currently on light duty for work)  Pt will benefit from skilled therapeutic intervention in order  to improve on the following deficits Decreased activity tolerance;Decreased endurance;Decreased strength;Difficulty walking;Obesity;Postural dysfunction;Pain;Improper body mechanics;Decreased mobility  Stability/Clinical Decision Making Stable/Uncomplicated  Clinical Decision Making Low  Rehab Potential Good  PT Frequency 2x / week  PT Duration 3 weeks  PT Treatment/Interventions ADLs/Self Care Home Management;Aquatic Therapy;Electrical Stimulation;Iontophoresis 4mg /ml Dexamethasone;Cryotherapy;Moist Heat;Traction;Functional mobility training;Therapeutic activities;Therapeutic exercise;Neuromuscular re-education;Patient/family education;Manual techniques;Passive range of motion;Joint Manipulations;Spinal Manipulations  PT Next Visit Plan Review Eval and goals. Perform lumbosacral manipulation and thoracolumbar junction manipulation. Continue with extension posture exercises and progress as able.  PT Home Exercise Plan Eval: lumbar extension standing  Consulted and Agree with Plan of Care Patient    Patient will benefit from skilled therapeutic intervention in order to improve the following deficits and impairments:  Decreased activity tolerance, Decreased endurance, Decreased strength, Difficulty walking, Obesity, Postural dysfunction, Pain, Improper body mechanics, Decreased mobility  Visit Diagnosis: Acute right-sided low back pain with right-sided sciatica  Abnormal posture     Problem List Patient Active Problem List   Diagnosis Date Noted  . Morbid obesity with body mass index (BMI) of 50.0 to 59.9 in adult ([place]) 05/29/2018  . OSA on CPAP 05/29/2018  . Class 3 obesity with alveolar hypoventilation, serious comorbidity, and body mass index (BMI) of 50.0 to 59.9 in adult ([place]) 01/12/2018  . Sleep related headaches 01/12/2018  . Intractable chronic cluster headache 01/12/2018  . Excessive daytime sleepiness 01/12/2018  . Sleeps in sitting position due to orthopnea 01/12/2018  .  Other fatigue 11/15/2017  . Shortness of breath on exertion 11/15/2017  . Type 2 diabetes mellitus without complication, without long-term current use of insulin ([place]) 11/15/2017  . Essential hypertension 11/15/2017  . Other hyperlipidemia 11/15/2017  . Allergic rhinitis 10/12/2017  . DOE (dyspnea on exertion)   . Chest pain 08/10/2017  . Type 2 diabetes mellitus with hyperlipidemia ([place]) 08/10/2017  . Personal history of dysmenorrhea 03/10/2017  . Chronic hypertension 02/21/2017  . Dysmenorrhea 01/27/2017  . History of herpes simplex infection 12/18/2013  . Flank pain 09/21/2012  . GERD (gastroesophageal reflux disease) 09/21/2012  . Herpes genitalia 09/21/2012    Kipp Brood, PT, DPT, [facility] Physical Therapist with [facility]  10/26/2018, 5:52 PM  [facility] [address] [place], [place], [address] Phone: [phone]   Fax:  [phone]  Name: Susan [name] MRN: [id number] Date of Birth: 07/15/1971

## 2018-10-31 ENCOUNTER — Encounter (HOSPITAL_COMMUNITY): Payer: Self-pay

## 2018-10-31 ENCOUNTER — Ambulatory Visit (HOSPITAL_COMMUNITY): Payer: PRIVATE HEALTH INSURANCE

## 2018-10-31 ENCOUNTER — Other Ambulatory Visit: Payer: Self-pay

## 2018-10-31 DIAGNOSIS — R293 Abnormal posture: Secondary | ICD-10-CM

## 2018-10-31 DIAGNOSIS — M5441 Lumbago with sciatica, right side: Secondary | ICD-10-CM | POA: Diagnosis not present

## 2018-10-31 NOTE — Therapy (Signed)
[facility] [place], [place], [address] Phone: [phone]   Fax:  [phone]  Physical Therapy Treatment  Patient Details  Name: Susan [name] MRN: [id number] Date of Birth: 1971/07/26 Referring Provider (PT): Maximino Sarin, FNP   Encounter Date: 10/31/2018  PT End of Session - 10/31/18 1031    Visit Number  2    Number of Visits  7    Date for PT Re-Evaluation  11/16/18    Authorization Type  Worker's Comp Claim #[id number] DOI:09/19/2018 Approved eval and 6 visits     Authorization Time Period  10/26/2018 - 11/16/2018    Authorization - Visit Number  1    Authorization - Number of Visits  6    PT Start Time  3220   pt late   PT Stop Time  1032    PT Time Calculation (min)  34 min    Activity Tolerance  Patient tolerated treatment well    Behavior During Therapy  [facility] for tasks assessed/performed       Past Medical History:  Diagnosis Date  . Anxiety   . Diabetes mellitus ([place])   . Fluid retention   . GERD (gastroesophageal reflux disease)   . Heart murmur   . Herpes simplex without mention of complication   . History of herpes simplex infection 12/18/2013  . Hyperlipidemia   . Hypertension   . Ovarian cyst   . Reflux   . Sciatica   . SOB (shortness of breath)     Past Surgical History:  Procedure Laterality Date  . DILATION AND CURETTAGE OF UTERUS    . ENDOMETRIAL ABLATION    . TUBAL LIGATION  2006    There were no vitals filed for this visit.  Subjective Assessment - 10/31/18 1000    Subjective  Patient reports she walked 10-15 minutes yesterday and it did not bother her back. She states she if feeling good and that her exercises have been going well, she is doing them 2x/day.    Limitations  Other (comment)   bending forward   How long can you sit comfortably?  back gets stiff if she doesn't get up to stretch     How long can you stand comfortably?  back gets stiff if after prolonged standing     How long can you walk comfortably?  15 minutes    Patient Stated Goals  to alleviated the "pushing pain" in her lower back    Currently in Pain?  Yes    Pain Score  3     Pain Location  Back    Pain Orientation  Right;Lower    Pain Descriptors / Indicators  Aching    Pain Type  Acute pain   subacute   Pain Onset  1 to 4 weeks ago    Pain Frequency  Intermittent    Aggravating Factors   forward bending, walking prolonged time    Pain Relieving Factors  heat    Effect of Pain on Daily Activities  mild        [facility] Adult PT Treatment/Exercise - 10/31/18 0001      Exercises   Exercises  Lumbar      Lumbar Exercises: Stretches   Standing Extension  10 reps;10 seconds    Prone on Elbows Stretch  10 seconds    Prone on Elbows Stretch Limitations  10 reps      Lumbar Exercises: Standing   Functional Squats  10  reps    Functional Squats Limitations  2 sets, chair/mat table taps    Other Standing Lumbar Exercises  Side stepping with TB - red around ankles, 3x 15' RT      Lumbar Exercises: Sidelying   Hip Abduction  Both;15 reps;3 seconds    Hip Abduction Weights (lbs)  cues to kick back and up      Manual Therapy   Manual Therapy  Joint mobilization    Manual therapy comments  Performed seperate from other interventions    Joint Mobilization  5 minutes lateral glide to L4-5 on Lt and 5 minutes latearl glide L4-5 on Rt in sidelying with hip rocking        PT Education - 10/31/18 1030    Education Details  Reviewed eva and goals. Educated on exercises throughout and on purpose of interventions. Encouraged to increase frequency of lumbar extension exercise to every 2 hours for HEP.    Person(s) Educated  Patient    Methods  Explanation    Comprehension  Verbalized understanding;Returned demonstration       PT Short Term Goals - 10/31/18 1000      PT SHORT TERM GOAL #1   Title  Patient will be independent with HEP, updated PRN, to progress lumbar extensor strengthening and  tolerance to flexion postures.    Time  1    Period  Weeks    Status  On-going    Target Date  11/02/18        PT Long Term Goals - 10/31/18 1001      PT LONG TERM GOAL #1   Title  Patient will improve FOTO score to 45% or less to achieve significant improvement in self reported limitation related to back paind during work and functional daily activitites.    Time  3    Period  Weeks    Status  On-going      PT LONG TERM GOAL #2   Title  Patient will perform lumbar AROM testing with no pain provocation to indicate reduced irritability.    Time  3    Period  Weeks    Status  On-going      PT LONG TERM GOAL #3   Title  Patient will perform 10 reps of forward flexion with no increase in low back pain or symptom provocation to indicate reduce irritability.    Time  3    Period  Weeks    Status  On-going      PT LONG TERM GOAL #4   Title  Patient will demonstrate proper lifting mechanics to improve posture and reduce risk of injury at work environment.    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    Time  3    Period  Weeks    Status  On-going        Plan - 10/31/18 1031    Clinical Impression Statement  Session limited by time as patient arrived late. Reviewed goals and patient was agreeable that they meet her expectations for therapy. Initiated manual therapy with lateral glide to lumbar spine this session followed by extension posture exercises. Exercise introduced for gluteus medius strengthening and patient reported muscle fatigue with side-stepping with theraband. She  reports relief in lumbar spine following standing extension and this was performed at [facility]. Patient will continue to benefit from skilled PT interventions to address impairments and progress towards goals to return to  work.     Personal Factors and Comorbidities  Comorbidity 3+    Comorbidities  Hypertension, Diabetes Mellitus, Hyperlipidemia, Anxiety    Examination-Activity Limitations  Lift;Bend    Examination-Participation Restrictions  Other   currently on light duty for work   Stability/Clinical Decision Making  Stable/Uncomplicated    Rehab Potential  Good    PT Frequency  2x / week    PT Duration  3 weeks    PT Treatment/Interventions  ADLs/Self Care Home Management;Aquatic Therapy;Electrical Stimulation;Iontophoresis 4mg /ml Dexamethasone;Cryotherapy;Moist Heat;Traction;Functional mobility training;Therapeutic activities;Therapeutic exercise;Neuromuscular re-education;Patient/family education;Manual techniques;Passive range of motion;Joint Manipulations;Spinal Manipulations    PT Next Visit Plan  Continue with lumbar spine mobilization in lateral gliding. Perform lumbosacral manipulation and thoracolumbar junction manipulation. Continue with extension posture exercises and progress as able.    PT Home Exercise Plan  Eval: lumbar extension standing    Consulted and Agree with Plan of Care  Patient       Patient will benefit from skilled therapeutic intervention in order to improve the following deficits and impairments:  Decreased activity tolerance, Decreased endurance, Decreased strength, Difficulty walking, Obesity, Postural dysfunction, Pain, Improper body mechanics, Decreased mobility  Visit Diagnosis: Acute right-sided low back pain with right-sided sciatica  Abnormal posture     Problem List Patient Active Problem List   Diagnosis Date Noted  . Morbid obesity with body mass index (BMI) of 50.0 to 59.9 in adult ([place]) 05/29/2018  . OSA on CPAP 05/29/2018  . Class 3 obesity with alveolar hypoventilation, serious comorbidity, and body mass index (BMI) of 50.0 to 59.9 in adult ([place]) 01/12/2018  . Sleep related headaches 01/12/2018  . Intractable chronic cluster headache 01/12/2018  .  Excessive daytime sleepiness 01/12/2018  . Sleeps in sitting position due to orthopnea 01/12/2018  . Other fatigue 11/15/2017  . Shortness of breath on exertion 11/15/2017  . Type 2 diabetes mellitus without complication, without long-term current use of insulin ([place]) 11/15/2017  . Essential hypertension 11/15/2017  . Other hyperlipidemia 11/15/2017  . Allergic rhinitis 10/12/2017  . DOE (dyspnea on exertion)   . Chest pain 08/10/2017  . Type 2 diabetes mellitus with hyperlipidemia ([place]) 08/10/2017  . Personal history of dysmenorrhea 03/10/2017  . Chronic hypertension 02/21/2017  . Dysmenorrhea 01/27/2017  . History of herpes simplex infection 12/18/2013  . Flank pain 09/21/2012  . GERD (gastroesophageal reflux disease) 09/21/2012  . Herpes genitalia 09/21/2012    Kipp Brood, PT, DPT, [facility] Physical Therapist with [facility]  10/31/2018 10:31 AM    [facility] [address] [place], [place], [address] Phone: [phone]   Fax:  [phone]  Name: Susan [name] MRN: [id number] Date of Birth: 1971/07/13

## 2018-11-02 ENCOUNTER — Ambulatory Visit (HOSPITAL_COMMUNITY): Payer: PRIVATE HEALTH INSURANCE | Admitting: Physical Therapy

## 2018-11-02 ENCOUNTER — Other Ambulatory Visit: Payer: Self-pay

## 2018-11-02 ENCOUNTER — Encounter (HOSPITAL_COMMUNITY): Payer: Self-pay | Admitting: Physical Therapy

## 2018-11-02 DIAGNOSIS — M5441 Lumbago with sciatica, right side: Secondary | ICD-10-CM | POA: Diagnosis not present

## 2018-11-02 DIAGNOSIS — R293 Abnormal posture: Secondary | ICD-10-CM

## 2018-11-02 NOTE — Therapy (Signed)
[place], [place], [address] Phone: [phone]   Fax:  [phone]  Physical Therapy Treatment  Patient Details  Name: Susan [name] MRN: [id number] Date of Birth: 1971-07-23 Referring Provider (PT): Maximino Sarin, FNP   Encounter Date: 11/02/2018  PT End of Session - 11/02/18 1555    Visit Number  3    Number of Visits  7    Date for PT Re-Evaluation  11/16/18    Authorization Type  Worker's Comp Claim #[id number] DOI:09/19/2018 Approved eval and 6 visits     Authorization Time Period  10/26/2018 - 11/16/2018    Authorization - Visit Number  2    Authorization - Number of Visits  6    PT Start Time  5726    PT Stop Time  1515    PT Time Calculation (min)  40 min    Activity Tolerance  Patient tolerated treatment well    Behavior During Therapy  [facility] for tasks assessed/performed       Past Medical History:  Diagnosis Date  . Anxiety   . Diabetes mellitus ([place])   . Fluid retention   . GERD (gastroesophageal reflux disease)   . Heart murmur   . Herpes simplex without mention of complication   . History of herpes simplex infection 12/18/2013  . Hyperlipidemia   . Hypertension   . Ovarian cyst   . Reflux   . Sciatica   . SOB (shortness of breath)     Past Surgical History:  Procedure Laterality Date  . DILATION AND CURETTAGE OF UTERUS    . ENDOMETRIAL ABLATION    . TUBAL LIGATION  2006    There were no vitals filed for this visit.  Subjective Assessment - 11/02/18 1551    Subjective  Patient reported she is doing well and has minimal low back pain today which she rates as a 2/10.     Limitations  Other (comment)   bending forward   How long can you sit comfortably?  back gets stiff if she doesn't get up to stretch     How long can you stand comfortably?  back gets stiff if after prolonged standing    How long can you walk comfortably?  15 minutes    Patient Stated Goals  to alleviated the  "pushing pain" in her lower back    Currently in Pain?  Yes    Pain Score  2     Pain Location  Back    Pain Orientation  Right;Lower    Pain Descriptors / Indicators  Aching    Pain Type  Acute pain    Pain Onset  1 to 4 weeks ago                       [facility] Adult PT Treatment/Exercise - 11/02/18 0001      Exercises   Exercises  Lumbar      Lumbar Exercises: Stretches   Standing Extension  10 reps;10 seconds    Prone on Elbows Stretch  10 seconds    Prone on Elbows Stretch Limitations  10 reps      Lumbar Exercises: Standing   Functional Squats  10 reps    Functional Squats Limitations  2 sets, chair/mat table taps    Other Standing Lumbar Exercises  Side stepping with TB - red around ankles, 3x 15' RT      Lumbar Exercises: Supine  Bridge  10 reps    Bridge Limitations  3''      Lumbar Exercises: Sidelying   Hip Abduction  Both;15 reps;3 seconds    Hip Abduction Weights (lbs)  cues to kick back and up      Lumbar Exercises: Prone   Other Prone Lumbar Exercises  Prone on elbows 10x10''      Manual Therapy   Manual Therapy  Joint mobilization;Soft tissue mobilization    Manual therapy comments  Performed seperate from other interventions    Joint Mobilization  PA joint mobilization grade III/IV patient prone 30 seconds x4    Soft tissue mobilization  To patient's lumbar paraspinals to decrease pain             PT Education - 11/02/18 1555    Education Details  Discussed purpose and technique of interventions throughout session.     Person(s) Educated  Patient    Methods  Explanation    Comprehension  Verbalized understanding       PT Short Term Goals - 10/31/18 1000      PT SHORT TERM GOAL #1   Title  Patient will be independent with HEP, updated PRN, to progress lumbar extensor strengthening and tolerance to flexion postures.    Time  1    Period  Weeks    Status  On-going    Target Date  11/02/18        PT Long Term Goals -  10/31/18 1001      PT LONG TERM GOAL #1   Title  Patient will improve FOTO score to 45% or less to achieve significant improvement in self reported limitation related to back paind during work and functional daily activitites.    Time  3    Period  Weeks    Status  On-going      PT LONG TERM GOAL #2   Title  Patient will perform lumbar AROM testing with no pain provocation to indicate reduced irritability.    Time  3    Period  Weeks    Status  On-going      PT LONG TERM GOAL #3   Title  Patient will perform 10 reps of forward flexion with no increase in low back pain or symptom provocation to indicate reduce irritability.    Time  3    Period  Weeks    Status  On-going      PT LONG TERM GOAL #4   Title  Patient will demonstrate proper lifting mechanics to improve posture and reduce risk of injury at work environment.    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    Time  3    Period  Weeks    Status  On-going            Plan - 11/02/18 1602    Clinical Impression Statement  This session continued with established plan of care. Added bridges to exercises this session. Patient demonstrated good form following minimal cueing. Ended session with manual therapy including joint mobilization and soft tissue mobilization.     Personal Factors and Comorbidities  Comorbidity 3+    Comorbidities  Hypertension, Diabetes Mellitus, Hyperlipidemia, Anxiety    Examination-Activity Limitations  Lift;Bend    Examination-Participation Restrictions  Other   currently on light duty for work   Stability/Clinical Decision Making  Stable/Uncomplicated    Rehab Potential  Good    PT Frequency  2x / week    PT Duration  3 weeks    PT Treatment/Interventions  ADLs/Self Care Home Management;Aquatic  Therapy;Electrical Stimulation;Iontophoresis 4mg /ml Dexamethasone;Cryotherapy;Moist Heat;Traction;Functional mobility training;Therapeutic activities;Therapeutic exercise;Neuromuscular re-education;Patient/family education;Manual techniques;Passive range of motion;Joint Manipulations;Spinal Manipulations    PT Next Visit Plan  Continue with lumbar spine mobilization in lateral gliding. Perform lumbosacral manipulation and thoracolumbar junction manipulation. Continue with extension posture exercises and progress as able.    PT Home Exercise Plan  Eval: lumbar extension standing    Consulted and Agree with Plan of Care  Patient       Patient will benefit from skilled therapeutic intervention in order to improve the following deficits and impairments:  Decreased activity tolerance, Decreased endurance, Decreased strength, Difficulty walking, Obesity, Postural dysfunction, Pain, Improper body mechanics, Decreased mobility  Visit Diagnosis: Acute right-sided low back pain with right-sided sciatica  Abnormal posture     Problem List Patient Active Problem List   Diagnosis Date Noted  . Morbid obesity with body mass index (BMI) of 50.0 to 59.9 in adult ([place]) 05/29/2018  . OSA on CPAP 05/29/2018  . Class 3 obesity with alveolar hypoventilation, serious comorbidity, and body mass index (BMI) of 50.0 to 59.9 in adult ([place]) 01/12/2018  . Sleep related headaches 01/12/2018  . Intractable chronic cluster headache 01/12/2018  . Excessive daytime sleepiness 01/12/2018  . Sleeps in sitting position due to orthopnea 01/12/2018  . Other fatigue 11/15/2017  . Shortness of breath on exertion 11/15/2017  . Type 2 diabetes mellitus without complication, without long-term current use of insulin ([place]) 11/15/2017  . Essential hypertension 11/15/2017  . Other hyperlipidemia 11/15/2017  . Allergic rhinitis 10/12/2017  . DOE (dyspnea on exertion)   . Chest pain 08/10/2017  . Type 2 diabetes mellitus with  hyperlipidemia ([place]) 08/10/2017  . Personal history of dysmenorrhea 03/10/2017  . Chronic hypertension 02/21/2017  . Dysmenorrhea 01/27/2017  . History of herpes simplex infection 12/18/2013  . Flank pain 09/21/2012  . GERD (gastroesophageal reflux disease) 09/21/2012  . Herpes genitalia 09/21/2012   Clarene Critchley PT, DPT 4:04 PM, 11/02/18 [place], [place], [address] Phone: [phone]   Fax:  [phone]  Name: ILINE BUCHINGER MRN: [id number] Date of Birth: 1970/10/14

## 2018-11-03 ENCOUNTER — Telehealth (HOSPITAL_COMMUNITY): Payer: Self-pay

## 2018-11-03 NOTE — Telephone Encounter (Signed)
I called Ms. Icenhower at the phone number provided and left a message to inform her that our office is closing for 2 weeks. I told her our tentative re-open date is 11/20/18 and that her remaining appointments would all be canceled. I provided our front office number and asked that she call us back to let us know if she would like to re-schedule for after 11/20/18 or if she would like [place] to call her when we officially re-open. I also asked her to let us know if she would like an updated HEP program as we would be able to email exercise to her.   Kipp Brood, PT, DPT, [facility] Physical Therapist with [facility]  11/03/2018 1:39 PM

## 2018-11-07 ENCOUNTER — Ambulatory Visit (HOSPITAL_COMMUNITY): Payer: BLUE CROSS/BLUE SHIELD

## 2018-11-07 ENCOUNTER — Telehealth (HOSPITAL_COMMUNITY): Payer: Self-pay

## 2018-11-07 DIAGNOSIS — G4733 Obstructive sleep apnea (adult) (pediatric): Secondary | ICD-10-CM | POA: Diagnosis not present

## 2018-11-07 NOTE — Telephone Encounter (Signed)
Called and left message as a f/u to Rachel's call on Friday, 11/03/18. Asked pt to give our office a call back so we could ask her about exercises, etc.   Geraldine Solar PT, DPT

## 2018-11-09 ENCOUNTER — Ambulatory Visit (HOSPITAL_COMMUNITY): Payer: BLUE CROSS/BLUE SHIELD

## 2018-11-14 ENCOUNTER — Encounter (HOSPITAL_COMMUNITY): Payer: Self-pay

## 2018-11-29 ENCOUNTER — Telehealth (HOSPITAL_COMMUNITY): Payer: Self-pay

## 2018-11-29 NOTE — Telephone Encounter (Signed)
Susan [name] was contacted today regarding temporary reduction of Outpatient Rehabilitation Services at [facility] due to concerns for community transmission of COVID-19.  Patient identity was verified.  Assessed if patient needed to be seen in person by clinician (recent fall or acute injury that requires hands on assessment and advice, change in diet order, post-surgical, special cases, etc.).     Patient did not have an acute/special need that requires in person visit. Proceeded with phone call.  Therapist advised the patient to continue to perform his/her HEP and assured he/she had no unanswered questions or concerns at this time.  The patient was offered and declined the continuation of their plan of care by using methods such as an E-Visit, virtual check in, or Telehealth visit.  She did request an HEP update and confirmed her email. She would like a weekly phone call. Patient is planning to see orthopedic surgeon.   Outpatient Rehabilitation Services at [facility] will follow up with this client when we are able to safely resume care at the clinic to all populations.   Patient is aware we can be reached by telephone during limited business hours in the meantime.    Susan [name], PT, DPT, [facility] Physical Therapist with [facility]  11/29/2018 4:13 PM

## 2018-12-08 ENCOUNTER — Telehealth (HOSPITAL_COMMUNITY): Payer: Self-pay | Admitting: Physical Therapy

## 2018-12-08 DIAGNOSIS — G4733 Obstructive sleep apnea (adult) (pediatric): Secondary | ICD-10-CM | POA: Diagnosis not present

## 2018-12-08 NOTE — Telephone Encounter (Signed)
Therapist called and left a message asking patient if she had any questions or concerns about her current HEP and if she needed any updates to her current HEP. Provided clinic phone number.   Clarene Critchley PT, DPT 2:26 PM, 12/08/18 [phone]

## 2018-12-26 ENCOUNTER — Telehealth (HOSPITAL_COMMUNITY): Payer: Self-pay | Admitting: Physical Therapy

## 2018-12-26 NOTE — Telephone Encounter (Signed)
Pt was contacted today by phone regarding resumption of therapy services following our temporary closure secondary to COVID-19.  Patient identity was verified  Pt would like to resume therapy services at this time.  Pt is in process of notifying her workers comp rep to make them aware.  Pt given appointment for 01/05/19  Teena Irani, PTA/CLT [phone]

## 2019-01-05 ENCOUNTER — Other Ambulatory Visit: Payer: Self-pay

## 2019-01-05 ENCOUNTER — Ambulatory Visit (HOSPITAL_COMMUNITY): Payer: BLUE CROSS/BLUE SHIELD | Attending: Orthopedic Surgery

## 2019-01-05 DIAGNOSIS — E782 Mixed hyperlipidemia: Secondary | ICD-10-CM | POA: Diagnosis not present

## 2019-01-05 DIAGNOSIS — R293 Abnormal posture: Secondary | ICD-10-CM | POA: Insufficient documentation

## 2019-01-05 DIAGNOSIS — M5441 Lumbago with sciatica, right side: Secondary | ICD-10-CM | POA: Diagnosis not present

## 2019-01-05 DIAGNOSIS — E118 Type 2 diabetes mellitus with unspecified complications: Secondary | ICD-10-CM | POA: Diagnosis not present

## 2019-01-05 DIAGNOSIS — E669 Obesity, unspecified: Secondary | ICD-10-CM | POA: Diagnosis not present

## 2019-01-05 DIAGNOSIS — I10 Essential (primary) hypertension: Secondary | ICD-10-CM | POA: Diagnosis not present

## 2019-01-05 NOTE — Therapy (Signed)
[facility] [address] [place], [place], [address] Phone: [phone]   Fax:  [phone]  Physical Therapy Re-Evaluation  Patient Details  Name: Susan [name] MRN: [id number] Date of Birth: [birth year] Referring Provider (PT): Maximino Sarin, FNP   Encounter Date: 01/05/2019  PT End of Session - 01/05/19 1503    Visit Number  1    Number of Visits  9   Date for PT Re-Evaluation  02/02/2019   Authorization Type  Worker's Comp Claim #[id number] DOI:09/19/2018 Approved eval and 8 visist (fax notes to [phone])   Authorization Time Period  10/26/2018 - 11/16/2018; 01/05/19-02/02/19   Authorization - Visit Number  0   Authorization - Number of Visits  8    PT Start Time 1441   PT Stop Time 1515   PT Time Calculation (min) 34 (calculated)   Activity Tolerance  Patient tolerated treatment well    Behavior During Therapy  [facility] for tasks assessed/performed       Past Medical History:  Diagnosis Date  . Anxiety   . Diabetes mellitus ([place])   . Fluid retention   . GERD (gastroesophageal reflux disease)   . Heart murmur   . Herpes simplex without mention of complication   . History of herpes simplex infection 12/18/2013  . Hyperlipidemia   . Hypertension   . Ovarian cyst   . Reflux   . Sciatica   . SOB (shortness of breath)     Past Surgical History:  Procedure Laterality Date  . DILATION AND CURETTAGE OF UTERUS    . ENDOMETRIAL ABLATION    . TUBAL LIGATION  2006    There were no vitals filed for this visit.  Subjective Assessment - 01/05/19 1441    Subjective  Patient reports she has been keeping up with her exercises about 2x/day since our office closed. She reports her back pain feels like it has gotten worse but that her leg pain has completely resolved. 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 She reports ~ 1 week ago she saw Dr. Lynann Bologna and he did not find any abnormalities in her back and believes her pain is related to a muscular source. He prescribed her some muscle relaxers which the patient reports has been helping. She states about 2-3 times per day she gets muscle spasms in her back because she doesn't like to take the medicine at work. She is interested in returning to therapy in our clinic.    Limitations  Other (comment)   bending forward   How long can you sit comfortably?  back gets stiff if she doesn't get up to stretch     How long can you stand comfortably?  back gets stiff if after prolonged standing    How long can you walk comfortably?  15 minutes    Patient Stated Goals  to alleviated the "pushing pain" in her lower back    Currently in Pain?  Yes    Pain Score  5     Pain Location  Back    Pain Orientation  Lower;Right;Left    Pain Descriptors / Indicators  Aching    Pain Type  Chronic pain    Pain Onset  1 to 4 weeks ago    Pain Frequency  Intermittent  [facility] PT Assessment - 01/05/19 0001      Assessment   Medical Diagnosis  Low Back Pain    Referring Provider (PT)  Maximino Sarin, FNP    Onset Date/Surgical Date  09/29/18    Next MD Visit  11/06/2018    Prior Therapy  none      Precautions   Precautions  None      Restrictions   Weight Bearing Restrictions  No      Home Environment   Living Environment  Private residence    Living Arrangements  Spouse/significant other;Children    Available Help at Discharge  Family    Type of [place] to enter    Entrance Stairs-Number of Steps  30   3rd floor apartment   [place]  One level    [place]  None      Prior Function   Level of Independence  Independent    Vocation  Full time employment    Vocation Requirements  Patient works full time at [employer]. 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 She is currently on light duty and is not required tophysically assist patients at this time.   Leisure  pt used to walk for exercise      Cognition   Overall Cognitive Status  Within Functional Limits for tasks assessed      Observation/Other Assessments   Focus on Therapeutic Outcomes (FOTO)  57% limited   was 59% limited     AROM   Overall AROM Comments  LUmbar quadrants tested: pt reports pulling sensation with front Lt and Rt quadrants    Lumbar Flexion  50   painful "pulling sensation"   Lumbar Extension  25    Lumbar - Right Side Bend  25    Lumbar - Left Side Bend  25   "pulling sensation" on Rt      Strength   Strength Assessment Site  Hip;Knee;Ankle    Right Hip Flexion  5/5    Right Hip Extension  5/5    Right Hip ABduction  5/5    Right Hip ADduction  --    Left Hip Flexion  5/5    Left Hip Extension  5/5    Left Hip ABduction  5/5    Left Hip ADduction  5/5    Right/Left Knee  Right;Left    Right Knee Flexion  5/5    Right Knee Extension  5/5    Left Knee Flexion  5/5    Left Knee Extension  5/5    Right Ankle Dorsiflexion  5/5    Left Ankle Dorsiflexion  5/5      Palpation   Spinal mobility  good mobility, pt remains tender at L5-S1    SI assessment   tenderness at Rt PSIS    Palpation commen tenderness to gluteus maximus on Rt and quadratus lumborem    Lumbar Special Test    Slump Test Negative bilaterally      [facility] PT Treatment/Exercise - 01/05/19 0001   Exercises  Exercises Lumbar  Lumbar Exercises: Stretches  Single Knee to Chest Stretch Right;Left;5 reps;10 seconds      PT Education - 01/05/19 1500  Education Details Discussed plan to continue with PT here and current approved visits per workmans comp. Educated on  telehealth options for later portion of treatments after manual interventions have been performed.   Person(s) Educated Patient  Methods Explanation  Comprehension  Verbalized understanding     PT Short Term Goals - 01/05/19 1518      PT SHORT TERM GOAL #1   Title  Patient will be independent with HEP, updated PRN, to progress lumbar extensor strengthening and tolerance to flexion postures.    Time  1    Period  Weeks    Status  Achieved    Target Date  11/02/18        PT Long Term Goals - 01/05/19 1520      PT LONG TERM GOAL #1   Title  Patient will improve FOTO score to 45% or less to achieve significant improvement in self reported limitation related to back paind during work and functional daily activitites.    Time  3    Period  Weeks    Status  Revised    Target Date  01/26/19      PT LONG TERM GOAL #2   Title  Patient will perform lumbar AROM testing with no pain provocation to indicate reduced irritability.    Time  3    Period  Weeks    Status  Revised    Target Date  01/26/19      PT LONG TERM GOAL #3   Title  Patient will perform 10 reps of forward flexion with no increase in low back pain or symptom provocation to indicate reduce irritability.    Time  3    Period  Weeks    Status  Revised    Target Date  01/26/19      PT LONG TERM GOAL #4   Title  Patient will demonstrate proper lifting mechanics to improve posture and reduce risk of injury at work environment.    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    Time  3    Period  Weeks    Status  Revised    Target Date  01/26/19         Plan - 01/05/19 1502    Clinical Impression Statement Ms. Scioli is returning to physical therapy in our office after ~2 month break due to office closure for COVID-19. Patient was compliant and consistent with HEP participation and demonstrated improved bil LE strength with MMT compared to initial evaluation. She  is no longer experiencing pain radiating into Rt LE and her pain is more centralized to the lumbosacral region. Based on her tenderness with palpation to soft tissue and pain with AROM Ms. Canizalez is presenting with a muscular pain origin and will benefit from skilled PT interventions to address impairments and progress towards goals to return to full work duties with reduced pain and reduced risk of injury. She was educated on dry needling as an option to address muscular restrictions and was interested in this treatment intervention.   Personal Factors and Comorbidities  Comorbidity 3+    Comorbidities  Hypertension, Diabetes Mellitus, Hyperlipidemia, Anxiety    Examination-Activity Limitations  Lift;Bend    Examination-Participation Restrictions  Other   currently on light duty for work   Stability/Clinical Decision  Making  Stable/Uncomplicated    Rehab Potential  Good    PT Frequency  2x / week    PT Duration  4 weeks    PT Treatment/Interventions  ADLs/Self Care Home Management;Aquatic Therapy;Electrical Stimulation;Iontophoresis 4mg /ml Dexamethasone;Cryotherapy;Moist Heat;Traction;Functional mobility training;Therapeutic activities;Therapeutic exercise;Neuromuscular re-education;Patient/family education;Manual techniques;Passive range of motion;Joint Manipulations;Spinal Manipulations; Dry Needling   PT Next Visit Plan  Continue with lumbar spine mobilization in lateral gliding. Perform lumbosacral manipulation and thoracolumbar junction manipulation. Progress exercises for deadlift and proper lifting mechanics. Initiate dry needling and perform soft tissue mobilization PRN.   PT Home Exercise Plan  Eval: lumbar extension standing; 01/05/19 - [facility]    Consulted and Agree with Plan of Care  Patient       Patient will benefit from skilled therapeutic intervention in order to improve the following deficits and impairments:  Decreased activity tolerance, Decreased endurance, Decreased strength,  Difficulty walking, Obesity, Postural dysfunction, Pain, Improper body mechanics, Decreased mobility  Visit Diagnosis: Acute right-sided low back pain with right-sided sciatica  Abnormal posture     Problem List Patient Active Problem List   Diagnosis Date Noted  . Morbid obesity with body mass index (BMI) of 50.0 to 59.9 in adult ([place]) 05/29/2018  . OSA on CPAP 05/29/2018  . Class 3 obesity with alveolar hypoventilation, serious comorbidity, and body mass index (BMI) of 50.0 to 59.9 in adult ([place]) 01/12/2018  . Sleep related headaches 01/12/2018  . Intractable chronic cluster headache 01/12/2018  . Excessive daytime sleepiness 01/12/2018  . Sleeps in sitting position due to orthopnea 01/12/2018  . Other fatigue 11/15/2017  . Shortness of breath on exertion 11/15/2017  . Type 2 diabetes mellitus without complication, without long-term current use of insulin ([place]) 11/15/2017  . Essential hypertension 11/15/2017  . Other hyperlipidemia 11/15/2017  . Allergic rhinitis 10/12/2017  . DOE (dyspnea on exertion)   . Chest pain 08/10/2017  . Type 2 diabetes mellitus with hyperlipidemia ([place]) 08/10/2017  . Personal history of dysmenorrhea 03/10/2017  . Chronic hypertension 02/21/2017  . Dysmenorrhea 01/27/2017  . History of herpes simplex infection 12/18/2013  . Flank pain 09/21/2012  . GERD (gastroesophageal reflux disease) 09/21/2012  . Herpes genitalia 09/21/2012    Kipp Brood, PT, DPT, [facility] Physical Therapist with [facility]  01/05/2019 5:00 PM    [facility] [address] [place], [place], [address] Phone: [phone]   Fax:  [phone]  Name: BRAYLON LEMMONS MRN: [id number] Date of Birth: 04-22-1971

## 2019-01-10 ENCOUNTER — Ambulatory Visit (HOSPITAL_COMMUNITY): Payer: BLUE CROSS/BLUE SHIELD | Admitting: Physical Therapy

## 2019-01-10 ENCOUNTER — Other Ambulatory Visit: Payer: Self-pay

## 2019-01-10 DIAGNOSIS — M5441 Lumbago with sciatica, right side: Secondary | ICD-10-CM | POA: Diagnosis not present

## 2019-01-10 DIAGNOSIS — R293 Abnormal posture: Secondary | ICD-10-CM

## 2019-01-10 NOTE — Therapy (Signed)
[place], [place], [address] Phone: [phone]   Fax:  [phone]  Physical Therapy Treatment  Patient Details  Name: Susan [name] MRN: [id number] Date of Birth: 12-28-1970 Referring Provider (PT): Phylliss Bob, MD (originally referred by Maximino Sarin, FNP)   Encounter Date: 01/10/2019    Past Medical History:  Diagnosis Date  . Anxiety   . Diabetes mellitus ([place])   . Fluid retention   . GERD (gastroesophageal reflux disease)   . Heart murmur   . Herpes simplex without mention of complication   . History of herpes simplex infection 12/18/2013  . Hyperlipidemia   . Hypertension   . Ovarian cyst   . Reflux   . Sciatica   . SOB (shortness of breath)     Past Surgical History:  Procedure Laterality Date  . DILATION AND CURETTAGE OF UTERUS    . ENDOMETRIAL ABLATION    . TUBAL LIGATION  2006    There were no vitals filed for this visit.  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  Reports her pain is 5/10 today.  States she brought her electrical stim for [place] to show placement of pads.     Currently in Pain?  Yes    Pain Score  5     Pain Location  Back    Pain Orientation  Right;Lower                       [facility] Adult PT Treatment/Exercise - 01/10/19 0001      Lumbar Exercises: Stretches   Active Hamstring Stretch  2 reps;30 seconds    Active Hamstring Stretch Limitations  each LE long sitting    Standing Extension  10 reps;10 seconds    Other Lumbar Stretch Exercise  lumbar exursions 10 reps each      Lumbar Exercises: Standing   Functional Squats  10 reps    Functional Squats Limitations  2 sets, chair/mat table taps    Lifting  From floor    Lifting Weights (lbs)  8#    Lifting Limitations  floor to waist 5 reps      Lumbar Exercises: Prone   Other  Prone Lumbar Exercises  Prone on elbows 10x10''      Manual Therapy   Manual Therapy  Soft tissue mobilization;Other (comment)    Manual therapy comments  Performed seperate from other interventions    Soft tissue mobilization  To patient's lumbar paraspinals and glutes prone lying to decrease pain    Other Manual Therapy  TENS instruction             PT Education - 01/10/19 1052    Education Details  reviewed HEP, TENS placement and instruction    Person(s) Educated  Patient    Methods  Explanation;Demonstration;Verbal cues;Tactile cues    Comprehension  Verbalized understanding;Returned demonstration;Verbal cues required;Tactile cues required       PT Short Term Goals - 01/05/19 1518      PT SHORT TERM GOAL #1   Title  Patient will be independent with HEP, updated PRN, to progress lumbar extensor strengthening and tolerance to flexion postures.    Time  1    Period  Weeks    Status  Achieved    Target Date  11/02/18  PT Long Term Goals - 01/05/19 1520      PT LONG TERM GOAL #1   Title  Patient will improve FOTO score to 45% or less to achieve significant improvement in self reported limitation related to back paind during work and functional daily activitites.    Time  3    Period  Weeks    Status  Revised    Target Date  01/26/19      PT LONG TERM GOAL #2   Title  Patient will perform lumbar AROM testing with no pain provocation to indicate reduced irritability.    Time  3    Period  Weeks    Status  Revised    Target Date  01/26/19      PT LONG TERM GOAL #3   Title  Patient will perform 10 reps of forward flexion with no increase in low back pain or symptom provocation to indicate reduce irritability.    Time  3    Period  Weeks    Status  Revised    Target Date  01/26/19      PT LONG TERM GOAL #4   Title  Patient will demonstrate proper lifting mechanics to improve posture and reduce risk of injury at work environment.    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    Time  3    Period  Weeks    Status  Revised    Target Date  01/26/19            Plan - 01/10/19 1053    Clinical Impression Statement  pt returns today requesting instruction for TENS unit.  Able to place and provide adequate contraction/comfort.  Pt able to operate and understand placement without any issues.  Contiued with exercises, shown seated stretch as alternative to supine and began manual to lower lumbar, glutes and paraspinals to help reduce pain and symptoms.  pt reported overall reduced pain at end of session but no number given.    Personal Factors and Comorbidities  Comorbidity 3+    Comorbidities  Hypertension, Diabetes Mellitus, Hyperlipidemia, Anxiety    Examination-Activity Limitations  Lift;Bend    Examination-Participation Restrictions  Other   currently on light duty for work   Stability/Clinical Decision Making  Stable/Uncomplicated    Rehab Potential  Good    PT Frequency  2x / week    PT Duration  4 weeks    PT Treatment/Interventions  ADLs/Self Care Home Management;Aquatic Therapy;Electrical Stimulation;Iontophoresis 4mg /ml Dexamethasone;Cryotherapy;Moist Heat;Traction;Functional mobility training;Therapeutic activities;Therapeutic exercise;Neuromuscular re-education;Patient/family education;Manual techniques;Passive range of motion;Joint Manipulations;Spinal Manipulations;Dry needling    PT Next Visit Plan  Continue with lumbar spine mobilization in lateral gliding. Perform lumbosacral manipulation and thoracolumbar junction manipulation. Progress exercises for deadlift and proper lifting mechanics. Initiate dry needling and perform soft tissue mobilization PRN.    PT Home Exercise Plan  Eval: lumbar extension standing;  01/05/19 - [facility]    Consulted and Agree with Plan of Care  Patient       Patient will benefit from skilled therapeutic intervention in order to improve the following deficits and impairments:  Decreased activity tolerance, Decreased endurance, Decreased strength, Difficulty walking, Obesity, Postural dysfunction, Pain,  Improper body mechanics, Decreased mobility  Visit Diagnosis: Acute right-sided low back pain with right-sided sciatica  Abnormal posture     Problem List Patient Active Problem List   Diagnosis Date Noted  . Morbid obesity with body mass index (BMI) of 50.0 to 59.9 in adult ([place]) 05/29/2018  . OSA on CPAP 05/29/2018  . Class 3 obesity with alveolar hypoventilation, serious comorbidity, and body mass index (BMI) of 50.0 to 59.9 in adult ([place]) 01/12/2018  . Sleep related headaches 01/12/2018  . Intractable chronic cluster headache 01/12/2018  . Excessive daytime sleepiness 01/12/2018  . Sleeps in sitting position due to orthopnea 01/12/2018  . Other fatigue 11/15/2017  . Shortness of breath on exertion 11/15/2017  . Type 2 diabetes mellitus without complication, without long-term current use of insulin ([place]) 11/15/2017  . Essential hypertension 11/15/2017  . Other hyperlipidemia 11/15/2017  . Allergic rhinitis 10/12/2017  . DOE (dyspnea on exertion)   . Chest pain 08/10/2017  . Type 2 diabetes mellitus with hyperlipidemia ([place]) 08/10/2017  . Personal history of dysmenorrhea 03/10/2017  . Chronic hypertension 02/21/2017  . Dysmenorrhea 01/27/2017  . History of herpes simplex infection 12/18/2013  . Flank pain 09/21/2012  . GERD (gastroesophageal reflux disease) 09/21/2012  . Herpes genitalia 09/21/2012   Teena Irani, PTA/CLT [phone]  Teena Irani 01/10/2019, 1:12 PM  [facility] [address] [place], [place], [address] Phone: [phone]   Fax:  [phone]  Name: Susan [name] MRN: [id number] Date  of Birth: 1970-11-23

## 2019-01-12 ENCOUNTER — Ambulatory Visit (HOSPITAL_COMMUNITY): Payer: BLUE CROSS/BLUE SHIELD

## 2019-01-12 ENCOUNTER — Encounter (HOSPITAL_COMMUNITY): Payer: Self-pay

## 2019-01-12 ENCOUNTER — Other Ambulatory Visit: Payer: Self-pay

## 2019-01-12 DIAGNOSIS — R293 Abnormal posture: Secondary | ICD-10-CM | POA: Diagnosis not present

## 2019-01-12 DIAGNOSIS — M5441 Lumbago with sciatica, right side: Secondary | ICD-10-CM

## 2019-01-12 NOTE — Patient Instructions (Signed)

## 2019-01-12 NOTE — Therapy (Signed)
[place], [place], [address] Phone: [phone]   Fax:  [phone]  Physical Therapy Treatment  Patient Details  Name: Susan [name] MRN: [id number] Date of Birth: [birth year] Referring Provider (PT): Phylliss Bob, MD (originally referred by Maximino Sarin, FNP)   Encounter Date: 01/12/2019  PT End of Session - 01/12/19 0830    Visit Number  2    Number of Visits  9    Date for PT Re-Evaluation  02/02/19    Authorization Type   Worker's Comp Claim #[id number] DOI:09/19/2018 Approved eval and 8 visist (fax notes to [phone])    Authorization Time Period  10/26/2018 - 11/16/2018; 01/05/19-02/02/19    Authorization - Visit Number  1    Authorization - Number of Visits  8    PT Start Time  0830    PT Stop Time  0911    PT Time Calculation (min)  41 min    Activity Tolerance  Patient tolerated treatment well    Behavior During Therapy  [facility] for tasks assessed/performed       Past Medical History:  Diagnosis Date  . Anxiety   . Diabetes mellitus ([place])   . Fluid retention   . GERD (gastroesophageal reflux disease)   . Heart murmur   . Herpes simplex without mention of complication   . History of herpes simplex infection 12/18/2013  . Hyperlipidemia   . Hypertension   . Ovarian cyst   . Reflux   . Sciatica   . SOB (shortness of breath)     Past Surgical History:  Procedure Laterality Date  . DILATION AND CURETTAGE OF UTERUS    . ENDOMETRIAL ABLATION    . TUBAL LIGATION  2006    There were no vitals filed for this visit.  Subjective Assessment - 01/12/19 0833    Subjective  Pt states that she is just a little sore this morning. No real pain, just some soreness.     Currently in Pain?  No/denies            [facility] Adult PT Treatment/Exercise - 01/12/19 0001      Lumbar Exercises: Stretches   Other Lumbar Stretch Exercise  lumbar flex/lats stretch with UE ER on large physioball 10x10" holds       Lumbar Exercises: Seated   Other Seated Lumbar Exercises  thoracic rotation with PVC pipe and bolster 10x5" holds    Other Seated Lumbar Exercises  thoracic ext x10 reps (cues to reduce lumbar spine extension)      Manual Therapy   Manual Therapy  Soft tissue mobilization;Joint mobilization    Manual therapy comments  Performed seperate from other interventions    Joint Mobilization  CPAs T4-L4 Grade III-IV for improved mobility and reduced pain    Soft tissue mobilization  STM after needling to lumbar paraspinals to reduce post-needling soreness, furhter reduce restrictions and pain      Trigger Point Dry Needling - 01/12/19 0001    Consent Given?  Yes    Education Handout Provided  Yes    Muscles Treated Back/Hip  Lumbar multifidi;Erector spinae    Erector spinae Response  Twitch response elicited;Palpable increased muscle length   L2-3 R and L   Lumbar multifidi Response  Twitch response elicited;Palpable increased muscle length   L2-3 R and L             PT Education - 01/12/19 0830    Education Details  dry needling risks and benefits    Person(s) Educated  Patient    Methods  Demonstration;Explanation;Handout    Comprehension  Verbalized understanding;Returned demonstration       PT Short Term Goals - 01/05/19 1518      PT SHORT TERM GOAL #1   Title  Patient will be independent with HEP, updated PRN, to progress lumbar extensor strengthening and tolerance to flexion postures.    Time  1    Period  Weeks    Status  Achieved    Target Date  11/02/18        PT Long Term Goals - 01/05/19 1520      PT LONG TERM GOAL #1   Title  Patient will improve FOTO score to 45% or less to achieve significant improvement in self reported limitation related to back paind during work and functional daily activitites.    Time  3    Period  Weeks    Status  Revised    Target Date  01/26/19      PT LONG TERM GOAL #2   Title  Patient will perform lumbar AROM testing with  no pain provocation to indicate reduced irritability.    Time  3    Period  Weeks    Status  Revised    Target Date  01/26/19      PT LONG TERM GOAL #3   Title  Patient will perform 10 reps of forward flexion with no increase in low back pain or symptom provocation to indicate reduce irritability.    Time  3    Period  Weeks    Status  Revised    Target Date  01/26/19      PT LONG TERM GOAL #4   Title  Patient will demonstrate proper lifting mechanics to improve posture and reduce risk of injury at work environment.    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    Time  3    Period  Weeks    Status  Revised    Target Date  01/26/19            Plan - 01/12/19 0917    Clinical Impression Statement  Initiated dry needling this date after pt agreeable to treatment after hearing risks and benefits. Focused on bil L2-3 lumbar paraspinals and multifidus; pt noted to be tighter on L side than on R. Followed up with STM and joint mobs, including thoracic spine joint mobs. Pt generally stiff throughout thoracic spine so initiated thoracic mobility work this date. Pt with difficulty dissociating thoracic mobility from lumbar; will continue to work on improving thoracic extension as this is what she had the most difficulty with. Added lats stretch/lumbar flex stretch with large phyiosball this date to improve overall ROM. Pt reported just feeling tight at EOS but no increased pain. Continue as planned, progressing as able.     Personal Factors and Comorbidities  Comorbidity 3+    Comorbidities  Hypertension, Diabetes Mellitus, Hyperlipidemia, Anxiety    Examination-Activity Limitations  Lift;Bend    Examination-Participation Restrictions  Other   currently on light duty for  work   Stability/Clinical Decision Making  Stable/Uncomplicated  Rehab Potential  Good    PT Frequency  2x / week    PT Duration  4 weeks    PT Treatment/Interventions  ADLs/Self Care Home Management;Aquatic Therapy;Electrical Stimulation;Iontophoresis 4mg /ml Dexamethasone;Cryotherapy;Moist Heat;Traction;Functional mobility training;Therapeutic activities;Therapeutic exercise;Neuromuscular re-education;Patient/family education;Manual techniques;Passive range of motion;Joint Manipulations;Spinal Manipulations;Dry needling    PT Next Visit Plan  Continue with lumbar spine mobilization in lateral gliding. Perform lumbosacral manipulation and thoracolumbar junction manipulation. Progress exercises for deadlift and proper lifting mechanics. continue dry needling and perform soft tissue mobilization PRN.    PT Home Exercise Plan  Eval: lumbar extension standing; 01/05/19 - [facility]    Consulted and Agree with Plan of Care  Patient       Patient will benefit from skilled therapeutic intervention in order to improve the following deficits and impairments:  Decreased activity tolerance, Decreased endurance, Decreased strength, Difficulty walking, Obesity, Postural dysfunction, Pain, Improper body mechanics, Decreased mobility  Visit Diagnosis: Acute right-sided low back pain with right-sided sciatica  Abnormal posture     Problem List Patient Active Problem List   Diagnosis Date Noted  . Morbid obesity with body mass index (BMI) of 50.0 to 59.9 in adult ([place]) 05/29/2018  . OSA on CPAP 05/29/2018  . Class 3 obesity with alveolar hypoventilation, serious comorbidity, and body mass index (BMI) of 50.0 to 59.9 in adult ([place]) 01/12/2018  . Sleep related headaches 01/12/2018  . Intractable chronic cluster headache 01/12/2018  . Excessive daytime sleepiness 01/12/2018  . Sleeps in sitting position due to orthopnea 01/12/2018  . Other fatigue 11/15/2017  . Shortness of breath on exertion 11/15/2017  .  Type 2 diabetes mellitus without complication, without long-term current use of insulin ([place]) 11/15/2017  . Essential hypertension 11/15/2017  . Other hyperlipidemia 11/15/2017  . Allergic rhinitis 10/12/2017  . DOE (dyspnea on exertion)   . Chest pain 08/10/2017  . Type 2 diabetes mellitus with hyperlipidemia ([place]) 08/10/2017  . Personal history of dysmenorrhea 03/10/2017  . Chronic hypertension 02/21/2017  . Dysmenorrhea 01/27/2017  . History of herpes simplex infection 12/18/2013  . Flank pain 09/21/2012  . GERD (gastroesophageal reflux disease) 09/21/2012  . Herpes genitalia 09/21/2012       Geraldine Solar PT, DPT   [facility] [address] [place], [place], [address] Phone: [phone]   Fax:  [phone]  Name: Susan [name] MRN: [id number] Date of Birth: Feb 09, 1971

## 2019-01-16 ENCOUNTER — Ambulatory Visit (HOSPITAL_COMMUNITY): Payer: BC Managed Care – PPO | Attending: Orthopedic Surgery

## 2019-01-16 ENCOUNTER — Other Ambulatory Visit: Payer: Self-pay

## 2019-01-16 ENCOUNTER — Encounter (HOSPITAL_COMMUNITY): Payer: Self-pay

## 2019-01-16 DIAGNOSIS — R293 Abnormal posture: Secondary | ICD-10-CM | POA: Diagnosis not present

## 2019-01-16 DIAGNOSIS — M5441 Lumbago with sciatica, right side: Secondary | ICD-10-CM | POA: Insufficient documentation

## 2019-01-16 NOTE — Therapy (Signed)
[place], [place], [address] Phone: [phone]   Fax:  [phone]  Physical Therapy Treatment  Patient Details  Name: Susan [name] MRN: [id number] Date of Birth: May 27, 1971 Referring Provider (PT): Phylliss Bob, MD (originally referred by Maximino Sarin, FNP)   Encounter Date: 01/16/2019  PT End of Session - 01/16/19 0817    Visit Number  3    Number of Visits  9    Date for PT Re-Evaluation  02/02/19    Authorization Type   Worker's Comp Claim #[id number] DOI:09/19/2018 Approved eval and 8 visist (fax notes to [phone])    Authorization Time Period  10/26/2018 - 11/16/2018; 01/05/19-02/02/19    Authorization - Visit Number  2    Authorization - Number of Visits  8    PT Start Time  0818    PT Stop Time  0859    PT Time Calculation (min)  41 min    Activity Tolerance  Patient tolerated treatment well    Behavior During Therapy  [facility] for tasks assessed/performed       Past Medical History:  Diagnosis Date  . Anxiety   . Diabetes mellitus ([place])   . Fluid retention   . GERD (gastroesophageal reflux disease)   . Heart murmur   . Herpes simplex without mention of complication   . History of herpes simplex infection 12/18/2013  . Hyperlipidemia   . Hypertension   . Ovarian cyst   . Reflux   . Sciatica   . SOB (shortness of breath)     Past Surgical History:  Procedure Laterality Date  . DILATION AND CURETTAGE OF UTERUS    . ENDOMETRIAL ABLATION    . TUBAL LIGATION  2006    There were no vitals filed for this visit.  Subjective Assessment - 01/16/19 0817    Subjective  Pt states that she was sore for the next day following the needling but she does feel like it helped. She stated that she did a lot of walking last night at work. No real pain, just some soreness.     Currently in Pain?  No/denies            [facility] Adult PT Treatment/Exercise - 01/16/19 0001      Lumbar Exercises:  Stretches   Prone Mid Back Stretch Limitations  pec stretch in doorway 3x20-30"      Lumbar Exercises: Standing   Other Standing Lumbar Exercises  hip hinging with dowel x12 reps (min cues for form)      Lumbar Exercises: Supine   Other Supine Lumbar Exercises  thoracic extension over 1/2 foam roll at 2 different segments 5x5" holds (posterior pelvic tilt/flex lumbar spine prior to thoracic extension)      Manual Therapy   Manual Therapy  Soft tissue mobilization;Joint mobilization    Manual therapy comments  Performed seperate from other interventions    Joint Mobilization  CPAs T1-L4 Grade III-IV for improved mobility and reduced pain    Soft tissue mobilization  STM after needling to lumbar paraspinals to reduce post-needling soreness, further reduce restrictions and pain       Trigger Point Dry Needling - 01/16/19 0001    Consent Given?  Yes    Education Handout Provided  Previously provided    Muscles Treated Back/Hip  Lumbar multifidi;Erector spinae    Erector spinae Response  Twitch response elicited;Palpable increased muscle length   bil L3-4   Lumbar multifidi Response  Twitch response elicited;Palpable increased muscle length   bil L3-4          PT Education - 01/16/19 0817    Education Details  exercise technique, continue HEP    Person(s) Educated  Patient    Methods  Explanation;Demonstration    Comprehension  Verbalized understanding;Returned demonstration       PT Short Term Goals - 01/05/19 1518      PT SHORT TERM GOAL #1   Title  Patient will be independent with HEP, updated PRN, to progress lumbar extensor strengthening and tolerance to flexion postures.    Time  1    Period  Weeks    Status  Achieved    Target Date  11/02/18        PT Long Term Goals - 01/05/19 1520      PT LONG TERM GOAL #1   Title  Patient will improve FOTO score to 45% or less to achieve significant improvement in self reported limitation related to back paind during work  and functional daily activitites.    Time  3    Period  Weeks    Status  Revised    Target Date  01/26/19      PT LONG TERM GOAL #2   Title  Patient will perform lumbar AROM testing with no pain provocation to indicate reduced irritability.    Time  3    Period  Weeks    Status  Revised    Target Date  01/26/19      PT LONG TERM GOAL #3   Title  Patient will perform 10 reps of forward flexion with no increase in low back pain or symptom provocation to indicate reduce irritability.    Time  3    Period  Weeks    Status  Revised    Target Date  01/26/19      PT LONG TERM GOAL #4   Title  Patient will demonstrate proper lifting mechanics to improve posture and reduce risk of injury at work environment.    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    Time  3    Period  Weeks    Status  Revised    Target Date  01/26/19            Plan - 01/16/19 0911    Clinical Impression Statement  Pt reporting + response to needling last Friday stating that she was sore the next day but overall felt like it helped reduce her pain. Resumed needling this date and performed at L3-4 lumbar paraspinals and multifidi as she had recreation of pain with palpation. Pt conitnues to be tighter on the L than the R but good twitch responses elicited bilaterally. Followed up with manual STM and CPAs T1-L4 for furhter reduced pain and improved mobility. Added supine thoracic extension over 1/2 foam roll; challenging for pt but able to better dissociate thoracic ext from lumbar ext. Ended with hip hinging with dowel for proper lifting mechanics training; min cues for form but overall pt did well with this to facilitate dissociate hip flex from lumbar flexion. Continue as planned, progressing as  able.     Personal  Factors and Comorbidities  Comorbidity 3+    Comorbidities  Hypertension, Diabetes Mellitus, Hyperlipidemia, Anxiety    Examination-Activity Limitations  Lift;Bend    Examination-Participation Restrictions  Other   currently on light duty for work   Stability/Clinical Decision Making  Stable/Uncomplicated    Rehab Potential  Good    PT Frequency  2x / week    PT Duration  4 weeks    PT Treatment/Interventions  ADLs/Self Care Home Management;Aquatic Therapy;Electrical Stimulation;Iontophoresis 4mg /ml Dexamethasone;Cryotherapy;Moist Heat;Traction;Functional mobility training;Therapeutic activities;Therapeutic exercise;Neuromuscular re-education;Patient/family education;Manual techniques;Passive range of motion;Joint Manipulations;Spinal Manipulations;Dry needling    PT Next Visit Plan  Continue with lumbar spine mobilization in lateral gliding. Perform lumbosacral manipulation and thoracolumbar junction manipulation when able; progress exercises for deadlift and proper lifting mechanics. continue dry needling, trialing estim with it, and perform soft tissue mobilization and joint mobilization     PT Home Exercise Plan  Eval: lumbar extension standing; 01/05/19 - [facility]    Consulted and Agree with Plan of Care  Patient       Patient will benefit from skilled therapeutic intervention in order to improve the following deficits and impairments:  Decreased activity tolerance, Decreased endurance, Decreased strength, Difficulty walking, Obesity, Postural dysfunction, Pain, Improper body mechanics, Decreased mobility  Visit Diagnosis: Acute right-sided low back pain with right-sided sciatica  Abnormal posture     Problem List Patient Active Problem List   Diagnosis Date Noted  . Morbid obesity with body mass index (BMI) of 50.0 to 59.9 in adult ([place]) 05/29/2018  . OSA on CPAP 05/29/2018  . Class 3 obesity with alveolar hypoventilation, serious comorbidity, and body mass index  (BMI) of 50.0 to 59.9 in adult ([place]) 01/12/2018  . Sleep related headaches 01/12/2018  . Intractable chronic cluster headache 01/12/2018  . Excessive daytime sleepiness 01/12/2018  . Sleeps in sitting position due to orthopnea 01/12/2018  . Other fatigue 11/15/2017  . Shortness of breath on exertion 11/15/2017  . Type 2 diabetes mellitus without complication, without long-term current use of insulin ([place]) 11/15/2017  . Essential hypertension 11/15/2017  . Other hyperlipidemia 11/15/2017  . Allergic rhinitis 10/12/2017  . DOE (dyspnea on exertion)   . Chest pain 08/10/2017  . Type 2 diabetes mellitus with hyperlipidemia ([place]) 08/10/2017  . Personal history of dysmenorrhea 03/10/2017  . Chronic hypertension 02/21/2017  . Dysmenorrhea 01/27/2017  . History of herpes simplex infection 12/18/2013  . Flank pain 09/21/2012  . GERD (gastroesophageal reflux disease) 09/21/2012  . Herpes genitalia 09/21/2012        Geraldine Solar PT, DPT    [place] [address] [place], [place], [address] Phone: [phone]   Fax:  [phone]  Name: RIM THATCH MRN: [id number] Date of Birth: 07/04/1971

## 2019-01-19 ENCOUNTER — Other Ambulatory Visit: Payer: Self-pay

## 2019-01-19 ENCOUNTER — Ambulatory Visit (HOSPITAL_COMMUNITY): Payer: BC Managed Care – PPO

## 2019-01-19 ENCOUNTER — Encounter (HOSPITAL_COMMUNITY): Payer: Self-pay

## 2019-01-19 DIAGNOSIS — M5441 Lumbago with sciatica, right side: Secondary | ICD-10-CM | POA: Diagnosis not present

## 2019-01-19 DIAGNOSIS — R293 Abnormal posture: Secondary | ICD-10-CM | POA: Diagnosis not present

## 2019-01-19 NOTE — Therapy (Signed)
[place], [place], [address] Phone: [phone]   Fax:  [phone]  Physical Therapy Treatment  Patient Details  Name: Susan [name] MRN: [id number] Date of Birth: 1971/06/02 Referring Provider (PT): Phylliss Bob, MD Nyoka Lint, Franchot Mimes, FNP)   Encounter Date: 01/19/2019  PT End of Session - 01/19/19 0952    Visit Number  5    Number of Visits  9    Date for PT Re-Evaluation  02/02/19    Authorization Type   Worker's Comp Claim #[id number] DOI:09/19/2018 Approved eval and 8 visist (fax notes to [phone])    Authorization Time Period  10/26/2018 - 11/16/2018; 01/05/19-02/02/19    Authorization - Visit Number  4    Authorization - Number of Visits  8    PT Start Time  0903    PT Stop Time  0944    PT Time Calculation (min)  41 min    Activity Tolerance  Patient tolerated treatment well    Behavior During Therapy  [facility] for tasks assessed/performed       Past Medical History:  Diagnosis Date  . Anxiety   . Diabetes mellitus ([place])   . Fluid retention   . GERD (gastroesophageal reflux disease)   . Heart murmur   . Herpes simplex without mention of complication   . History of herpes simplex infection 12/18/2013  . Hyperlipidemia   . Hypertension   . Ovarian cyst   . Reflux   . Sciatica   . SOB (shortness of breath)     Past Surgical History:  Procedure Laterality Date  . DILATION AND CURETTAGE OF UTERUS    . ENDOMETRIAL ABLATION    . TUBAL LIGATION  2006    There were no vitals filed for this visit.  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  Current pain scale 4/10 Rt lower back, soreness.    Patient Stated Goals  to alleviated the "pushing pain" in her lower back    Currently in Pain?  Yes    Pain Score  4     Pain Location  Back    Pain Orientation   Lower;Right    Pain Descriptors / Indicators  Sore;Aching    Pain Type  Chronic pain    Pain Onset  1 to 4 weeks ago    Pain Frequency  Intermittent    Aggravating Factors   forward bending, walking prolonged time    Pain Relieving Factors  heat     Effect of Pain on Daily Activities  mild         [facility] PT Assessment - 01/19/19 0001      Assessment   Medical Diagnosis  Low Back Pain    Referring Provider (PT)  Phylliss Bob, MD   Maximino Sarin, FNP   Onset Date/Surgical Date  09/29/18    Next MD Visit  01/22/19    Prior Therapy  none      Precautions   Precautions  None      Restrictions   Weight Bearing Restrictions  No      Observation/Other Assessments   Focus on Therapeutic Outcomes (FOTO)   52% limited    was 57% limited     AROM   Lumbar Flexion  60   "pulling not painful  Lumbar Extension  30   pain Rt side   Lumbar - Right Side Bend  25    Lumbar - Left Side Bend  25   pulling, no pain                  [facility] Adult PT Treatment/Exercise - 01/19/19 0001      Lumbar Exercises: Stretches   Quadruped Mid Back Stretch  2 reps;30 seconds    Quadruped Mid Back Stretch Limitations  child's pose    Prone Mid Back Stretch Limitations  pec stretch in doorway 3x20-30"    Other Lumbar Stretch Exercise  lumbar flex/lats stretch with UE ER on large physioball 10x10" holds      Lumbar Exercises: Standing   Functional Squats  10 reps    Functional Squats Limitations  2 sets, chair/mat table taps    Other Standing Lumbar Exercises  hip hinging with dowel x12 reps (min cues for form)      Lumbar Exercises: Supine   Other Supine Lumbar Exercises  thoracic extension over 1/2 foam roll at 2 different segments 5x5" holds (posterior pelvic tilt/flex lumbar spine prior to thoracic extension)      Lumbar Exercises: Quadruped   Other Quadruped Lumbar Exercises  thoracic rotation in quadruped 5x each      Manual Therapy   Manual Therapy  Soft tissue  mobilization    Manual therapy comments  Performed seperate from other interventions    Soft tissue mobilization  STM in prone to lumbar paraspinals, QL, and lats               PT Short Term Goals - 01/05/19 1518      PT SHORT TERM GOAL #1   Title  Patient will be independent with HEP, updated PRN, to progress lumbar extensor strengthening and tolerance to flexion postures.    Time  1    Period  Weeks    Status  Achieved    Target Date  11/02/18        PT Long Term Goals - 01/19/19 1008      PT LONG TERM GOAL #1   Title  Patient will improve FOTO score to 45% or less to achieve significant improvement in self reported limitation related to back paind during work and functional daily activitites.    Baseline  01/19/19: 52% limited was 57%    Status  On-going      PT LONG TERM GOAL #2   Title  Patient will perform lumbar AROM testing with no pain provocation to indicate reduced irritability.    Status  On-going      PT LONG TERM GOAL #3   Title  Patient will perform 10 reps of forward flexion with no increase in low back pain or symptom provocation to indicate reduce irritability.    Baseline  01/19/19:  Reports no pain, does feel a pull on Rt side    Status  On-going      PT LONG TERM GOAL #4   Title  Patient will demonstrate proper lifting mechanics to improve posture and reduce risk of injury at work environment.    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   Utilized dowel rod to improve mechanics with dead lifts and verbal cueing for weight bearing with squats to reduce strain on back and knees.  Added quadruped thoracic mobility and child's pose for lats stretch.  EOS with manual soft tissue mobilization to address restrictions in lower back to assist with pain.  Reviewed goals prior MD apt next Monday.  FOTO score at 52% limitation (was 57%).  Pt able to complete forward flexion with no pain, continues to feel the pull.      Personal Factors and Comorbidities  Comorbidity 3+    Comorbidities  Hypertension, Diabetes Mellitus, Hyperlipidemia, Anxiety    Examination-Activity Limitations  Lift;Bend    Examination-Participation Restrictions  Other   Currently on light duty at work   Stability/Clinical Decision Making  Stable/Uncomplicated    Rehab Potential  Good    PT Frequency  2x / week    PT Duration  4 weeks    PT Treatment/Interventions  ADLs/Self Care Home Management;Aquatic Therapy;Electrical Stimulation;Iontophoresis 4mg /ml Dexamethasone;Cryotherapy;Moist Heat;Traction;Functional mobility training;Therapeutic activities;Therapeutic exercise;Neuromuscular re-education;Patient/family education;Manual techniques;Passive range of motion;Joint Manipulations;Spinal Manipulations;Dry needling    PT Next Visit Plan  F/U wiht MD apt.  Continue with lumbar spine mobilization in lateral gliding. Perform lumbosacral manipulation and thoracolumbar junction manipulation when able; progress exercises for deadlift and proper lifting mechanics. continue dry needling, trialing estim with it, and perform soft tissue mobilization and joint mobilization     PT Home Exercise Plan  Eval: lumbar extension standing; 01/05/19 - [place]       Patient will benefit from skilled therapeutic intervention in order to improve the following deficits and impairments:  Decreased activity tolerance, Decreased endurance, Decreased strength, Difficulty walking, Obesity, Postural  dysfunction, Pain, Improper body mechanics, Decreased mobility  Visit Diagnosis: Acute right-sided low back pain with right-sided sciatica  Abnormal posture     Problem List Patient Active Problem List   Diagnosis Date Noted  . Morbid obesity with body mass index (BMI) of 50.0 to 59.9 in adult ([place]) 05/29/2018  . OSA on CPAP 05/29/2018  . Class 3 obesity with alveolar hypoventilation, serious comorbidity, and body mass index (BMI) of 50.0 to 59.9 in adult ([place]) 01/12/2018  . Sleep related headaches 01/12/2018  . Intractable chronic cluster headache 01/12/2018  . Excessive daytime sleepiness 01/12/2018  . Sleeps in sitting position due to orthopnea 01/12/2018  . Other fatigue 11/15/2017  . Shortness of breath on exertion 11/15/2017  . Type 2 diabetes mellitus without complication, without long-term current use of insulin ([place]) 11/15/2017  . Essential hypertension 11/15/2017  . Other hyperlipidemia 11/15/2017  . Allergic rhinitis 10/12/2017  . DOE (dyspnea on exertion)   . Chest pain 08/10/2017  . Type 2 diabetes mellitus with hyperlipidemia ([place]) 08/10/2017  . Personal history of dysmenorrhea 03/10/2017  . Chronic hypertension 02/21/2017  . Dysmenorrhea 01/27/2017  . History of herpes simplex infection 12/18/2013  . Flank pain 09/21/2012  . GERD (gastroesophageal reflux disease) 09/21/2012  . Herpes genitalia 09/21/2012   Ihor Austin, [place]; [place]  Aldona Lento 01/19/2019, 11:55 AM  [place], [place], [address] Phone: [phone]   Fax:  [phone]  Name:  LEILI ESKENAZI MRN: [id number] Date of Birth: 11/17/1970

## 2019-01-22 ENCOUNTER — Ambulatory Visit (HOSPITAL_COMMUNITY): Payer: BC Managed Care – PPO

## 2019-01-23 ENCOUNTER — Encounter (HOSPITAL_COMMUNITY): Payer: Self-pay

## 2019-01-23 ENCOUNTER — Ambulatory Visit (HOSPITAL_COMMUNITY): Payer: BC Managed Care – PPO

## 2019-01-23 ENCOUNTER — Other Ambulatory Visit: Payer: Self-pay

## 2019-01-23 DIAGNOSIS — M5441 Lumbago with sciatica, right side: Secondary | ICD-10-CM

## 2019-01-23 DIAGNOSIS — R293 Abnormal posture: Secondary | ICD-10-CM

## 2019-01-23 NOTE — Therapy (Signed)
[place], [place], [address] Phone: [phone]   Fax:  [phone]  Physical Therapy Treatment  Patient Details  Name: Susan [name] MRN: [id number] Date of Birth: 12-02-1970 Referring Provider (PT): Phylliss Bob, MD Nyoka Lint, Franchot Mimes, FNP)   Encounter Date: 01/23/2019  PT End of Session - 01/23/19 0917    Visit Number  6    Number of Visits  9    Date for PT Re-Evaluation  02/02/19    Authorization Type   Worker's Comp Claim #[id number] DOI:09/19/2018 Approved eval and 8 visist (fax notes to [phone])    Authorization Time Period  10/26/2018 - 11/16/2018; 01/05/19-02/02/19    Authorization - Visit Number  5    Authorization - Number of Visits  8    PT Start Time  0918    PT Stop Time  1002    PT Time Calculation (min)  44 min    Activity Tolerance  Patient tolerated treatment well    Behavior During Therapy  [facility] for tasks assessed/performed       Past Medical History:  Diagnosis Date  . Anxiety   . Diabetes mellitus ([place])   . Fluid retention   . GERD (gastroesophageal reflux disease)   . Heart murmur   . Herpes simplex without mention of complication   . History of herpes simplex infection 12/18/2013  . Hyperlipidemia   . Hypertension   . Ovarian cyst   . Reflux   . Sciatica   . SOB (shortness of breath)     Past Surgical History:  Procedure Laterality Date  . DILATION AND CURETTAGE OF UTERUS    . ENDOMETRIAL ABLATION    . TUBAL LIGATION  2006    There were no vitals filed for this visit.  Subjective Assessment - 01/23/19 0918    Subjective  Pt states that her back is overall feeling better. No pain at the moment, just some regular stiffness.    Patient Stated Goals  to alleviated the "pushing pain" in her lower back    Currently in Pain?  No/denies    Pain Onset  1 to 4 weeks ago          [facility] Adult PT Treatment/Exercise - 01/23/19 0001      Lumbar Exercises: Stretches   Prone  Mid Back Stretch Limitations  pec stretch in doorway 3x20-30"    Other Lumbar Stretch Exercise  child's pose 10x10" holds with UE ER      Lumbar Exercises: Standing   Other Standing Lumbar Exercises  hip hinging with dowel x12 reps    Other Standing Lumbar Exercises  10# deadlifts from 4" step 2x10 reps (PT holding dowel on back to ensure proper form, cues for mental imagery to facilitate gluteal contraction)      Lumbar Exercises: Seated   Other Seated Lumbar Exercises  thoracic extension over 1/2 foam roll 10x3-5" holds at 2-3 different segments      Manual Therapy   Manual Therapy  Soft tissue mobilization    Manual therapy comments  Performed seperate from other interventions    Soft tissue mobilization  STM after needling to lumbar paraspinals, distal thoracic paraspinals, QL, and lats to reduce pain and restrictions       Trigger Point Dry Needling - 01/23/19 0001    Consent Given?  Yes    Education Handout Provided  Previously provided    Muscles Treated Back/Hip  Lumbar multifidi;Erector spinae  Erector spinae Response  Twitch response elicited;Palpable increased muscle length   L3-4 bil in prone   Lumbar multifidi Response  Twitch response elicited;Palpable increased muscle length   L3-4 bil in prone          PT Education - 01/23/19 0917    Education Details  exercise technique, updated HEP, will reassess next visit    Person(s) Educated  Patient    Methods  Explanation;Demonstration;Handout    Comprehension  Verbalized understanding;Returned demonstration       PT Short Term Goals - 01/05/19 1518      PT SHORT TERM GOAL #1   Title  Patient will be independent with HEP, updated PRN, to progress lumbar extensor strengthening and tolerance to flexion postures.    Time  1    Period  Weeks    Status  Achieved    Target Date  11/02/18        PT Long Term Goals - 01/19/19 1008      PT LONG TERM GOAL #1   Title  Patient will improve FOTO score to 45% or less  to achieve significant improvement in self reported limitation related to back paind during work and functional daily activitites.    Baseline  01/19/19: 52% limited was 57%    Status  On-going      PT LONG TERM GOAL #2   Title  Patient will perform lumbar AROM testing with no pain provocation to indicate reduced irritability.    Status  On-going      PT LONG TERM GOAL #3   Title  Patient will perform 10 reps of forward flexion with no increase in low back pain or symptom provocation to indicate reduce irritability.    Baseline  01/19/19:  Reports no pain, does feel a pull on Rt side    Status  On-going      PT LONG TERM GOAL #4   Title  Patient will demonstrate proper lifting mechanics to improve posture and reduce risk of injury at work environment.    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 Pt continues to be tighter on L than R; able to elicit good twitch responses and good release of mm. Followed up with manual STM to lumbar paraspinals and distal thoracic paraspinals to further promote mm release, reduced restrictions and pain relief. Continued with thoracic and lumbar mobility work as well as [employer]. Form for deadlift looked good so able to add weights this date from elevated surface; min cues for form and pt did report feeling in her R lower back (QL region) and feel this is due to either her gluteal functional weakness and/or habit or compensation with her lower back. Had pt perform mental imagery for gluteal contraction and this  significantly helped and she stated that she did not feel in her lower back anymore. Pt due for reassessment next visit.     Personal Factors and Comorbidities  Comorbidity 3+  Comorbidities  Hypertension, Diabetes Mellitus, Hyperlipidemia, Anxiety    Examination-Activity Limitations  Lift;Bend    Examination-Participation Restrictions  Other   Currently on light duty at work   Stability/Clinical Decision Making  Stable/Uncomplicated    Rehab Potential  Good    PT Frequency  2x / week    PT Duration  4 weeks    PT Treatment/Interventions  ADLs/Self Care Home Management;Aquatic Therapy;Electrical Stimulation;Iontophoresis 4mg /ml Dexamethasone;Cryotherapy;Moist Heat;Traction;Functional mobility training;Therapeutic activities;Therapeutic exercise;Neuromuscular re-education;Patient/family education;Manual techniques;Passive range of motion;Joint Manipulations;Spinal Manipulations;Dry needling    PT Next Visit Plan  reassessment, Continue with lumbar spine mobilization in lateral gliding. Perform lumbosacral manipulation and thoracolumbar junction manipulation when able; progress exercises for deadlift and proper lifting mechanics. continue dry needling and perform soft tissue mobilization and joint mobilization     PT Home Exercise Plan  Eval: lumbar extension standing; 01/05/19 - SKTC; 6/9: thoracic extension seated    Consulted and Agree with Plan of Care  Patient       Patient will benefit from skilled therapeutic intervention in order to improve the following deficits and impairments:  Decreased activity tolerance, Decreased endurance, Decreased strength, Difficulty walking, Obesity, Postural dysfunction, Pain, Improper body mechanics, Decreased mobility  Visit Diagnosis: Acute right-sided low back pain with right-sided sciatica  Abnormal posture     Problem List Patient Active Problem List   Diagnosis Date Noted  . Morbid obesity with body mass index (BMI) of 50.0 to 59.9 in adult  ([place]) 05/29/2018  . OSA on CPAP 05/29/2018  . Class 3 obesity with alveolar hypoventilation, serious comorbidity, and body mass index (BMI) of 50.0 to 59.9 in adult ([place]) 01/12/2018  . Sleep related headaches 01/12/2018  . Intractable chronic cluster headache 01/12/2018  . Excessive daytime sleepiness 01/12/2018  . Sleeps in sitting position due to orthopnea 01/12/2018  . Other fatigue 11/15/2017  . Shortness of breath on exertion 11/15/2017  . Type 2 diabetes mellitus without complication, without long-term current use of insulin ([place]) 11/15/2017  . Essential hypertension 11/15/2017  . Other hyperlipidemia 11/15/2017  . Allergic rhinitis 10/12/2017  . DOE (dyspnea on exertion)   . Chest pain 08/10/2017  . Type 2 diabetes mellitus with hyperlipidemia ([place]) 08/10/2017  . Personal history of dysmenorrhea 03/10/2017  . Chronic hypertension 02/21/2017  . Dysmenorrhea 01/27/2017  . History of herpes simplex infection 12/18/2013  . Flank pain 09/21/2012  . GERD (gastroesophageal reflux disease) 09/21/2012  . Herpes genitalia 09/21/2012      Geraldine Solar PT, DPT   [facility] [address] [place], [place], [address] Phone: [phone]   Fax:  [phone]  Name: Susan [name] MRN: [id number] Date of Birth: 1971/05/15

## 2019-01-25 ENCOUNTER — Encounter (HOSPITAL_COMMUNITY): Payer: Self-pay

## 2019-01-25 ENCOUNTER — Ambulatory Visit (HOSPITAL_COMMUNITY): Payer: BC Managed Care – PPO

## 2019-01-25 ENCOUNTER — Other Ambulatory Visit: Payer: Self-pay

## 2019-01-25 DIAGNOSIS — M5441 Lumbago with sciatica, right side: Secondary | ICD-10-CM

## 2019-01-25 DIAGNOSIS — R293 Abnormal posture: Secondary | ICD-10-CM | POA: Diagnosis not present

## 2019-01-25 NOTE — Therapy (Signed)
Susan [name], [place], [address] Phone: [phone]   Fax:  [phone]  Physical Therapy Treatment  Patient Details  Name: Susan [name] MRN: [id number] Date of Birth: 01/20/1971 Referring Provider (PT): Phylliss Bob, MD Nyoka Lint, Franchot Mimes, FNP)   Encounter Date: 01/25/2019  PT End of Session - 01/25/19 0823    Visit Number  7    Number of Visits  9    Date for PT Re-Evaluation  02/02/19    Authorization Type   Worker's Comp Claim #[id number] DOI:09/19/2018 Approved eval and 8 visist (fax notes to [phone])    Authorization Time Period  10/26/2018 - 11/16/2018; 01/05/19-02/02/19    Authorization - Visit Number  6    Authorization - Number of Visits  8    PT Start Time  0818    PT Stop Time  0109    PT Time Calculation (min)  39 min    Activity Tolerance  Patient tolerated treatment well    Behavior During Therapy  [facility] for tasks assessed/performed       Past Medical History:  Diagnosis Date  . Anxiety   . Diabetes mellitus ([place])   . Fluid retention   . GERD (gastroesophageal reflux disease)   . Heart murmur   . Herpes simplex without mention of complication   . History of herpes simplex infection 12/18/2013  . Hyperlipidemia   . Hypertension   . Ovarian cyst   . Reflux   . Sciatica   . SOB (shortness of breath)     Past Surgical History:  Procedure Laterality Date  . DILATION AND CURETTAGE OF UTERUS    . ENDOMETRIAL ABLATION    . TUBAL LIGATION  2006    There were no vitals filed for this visit.  Subjective Assessment - 01/25/19 0822    Subjective  Pt reports that her back continues to get a little better each day. Did more walking last night at work and her back held up well.    Patient Stated Goals  to alleviated the "pushing pain" in her lower back    Currently in Pain?  No/denies               [facility] Adult PT Treatment/Exercise - 01/25/19 0001      Lumbar Exercises: Stretches   Prone Mid Back Stretch Limitations  pec stretch in doorway 3x20-30"    Other Lumbar Stretch Exercise  lumbar flex/lats stretch with UE ER on large physioball 10x10" holds    Other Lumbar Stretch Exercise  bil QL stretch in doorway 2x30"      Lumbar Exercises: Machines for Strengthening   Other Lumbar Machine Exercise  hip thrusters 30#, 2x10      Lumbar Exercises: Standing   Wall Slides Limitations  bil hip diagonals GTB 2x10 each    Row Limitations  2x10 5# dumbbells, bent over    Shoulder Extension Limitations  2x10 5# dumbbells, lat flys, bent over    Other Standing Lumbar Exercises  10# deadlifts from 4" step 2x10 reps (PT holding dowel on back to ensure proper form, cues for mental imagery to facilitate gluteal contraction)      Manual Therapy   Manual Therapy  Soft tissue mobilization    Manual therapy comments  Performed seperate from other interventions    Soft tissue mobilization  STM to lumbar paraspinals, distal thoracic paraspinals, QL, and lats to reduce pain and restrictions  PT Education - 01/25/19 0823    Education Details  exercise technique, continue HEP    Person(s) Educated  Patient    Methods  Explanation;Demonstration    Comprehension  Verbalized understanding;Returned demonstration       PT Short Term Goals - 01/05/19 1518      PT SHORT TERM GOAL #1   Title  Patient will be independent with HEP, updated PRN, to progress lumbar extensor strengthening and tolerance to flexion postures.    Time  1    Period  Weeks    Status  Achieved    Target Date  11/02/18        PT Long Term Goals - 01/19/19 1008      PT LONG TERM GOAL #1   Title  Patient will improve FOTO score to 45% or less to achieve significant improvement in self reported limitation related to back paind during work and functional daily activitites.    Baseline  01/19/19: 52% limited was 57%    Status  On-going      PT LONG TERM GOAL #2   Title  Patient will perform lumbar  AROM testing with no pain provocation to indicate reduced irritability.    Status  On-going      PT LONG TERM GOAL #3   Title  Patient will perform 10 reps of forward flexion with no increase in low back pain or symptom provocation to indicate reduce irritability.    Baseline  01/19/19:  Reports no pain, does feel a pull on Rt side    Status  On-going      PT LONG TERM GOAL #4   Title  Patient will demonstrate proper lifting mechanics to improve posture and reduce risk of injury at work environment.    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 Deadlifts continue to demo good form. Added hip thrusters at multigym and hip diagonals with GTB for isolated glute strengthening. Ended with manual STM to reduce restrictions in lower back. Pt noted to have improved restrictions around L3-4 but also noted increased restrictions around L1-2 this date, mostly on the R side; palpation recreated her same pain. Pt reported feeling looser at EOS. Continue as planned, progressing as able.    Personal Factors and Comorbidities  Comorbidity 3+    Comorbidities  Hypertension, Diabetes Mellitus, Hyperlipidemia, Anxiety    Examination-Activity Limitations  Lift;Bend    Examination-Participation Restrictions  Other   Currently on light duty at work   Stability/Clinical Decision Making  Stable/Uncomplicated    Rehab Potential  Good    PT Frequency  2x / week    PT Duration  4 weeks    PT  Treatment/Interventions  ADLs/Self Care Home Management;Aquatic Therapy;Electrical Stimulation;Iontophoresis 4mg /ml Dexamethasone;Cryotherapy;Moist Heat;Traction;Functional mobility training;Therapeutic activities;Therapeutic exercise;Neuromuscular re-education;Patient/family education;Manual techniques;Passive range of motion;Joint Manipulations;Spinal Manipulations;Dry needling    PT Next Visit Plan  begin progressed core activation/strengthening; continue deadlifting,  gluteal activation exercises;  Perform lumbosacral manipulation and thoracolumbar junction manipulation when able; progress exercises for deadlift and proper lifting mechanics. continue dry needling and perform soft tissue mobilization and joint mobilization PRN    PT Home Exercise Plan  Eval: lumbar extension standing; 01/05/19 - SKTC; 6/9: thoracic extension seated    Consulted and Agree with Plan of Care  Patient       Patient will benefit from skilled therapeutic intervention in order to improve the following deficits and impairments:  Decreased activity tolerance, Decreased endurance, Decreased strength, Difficulty walking, Obesity, Postural dysfunction, Pain, Improper body mechanics, Decreased mobility  Visit Diagnosis: Acute right-sided low back pain with right-sided sciatica  Abnormal posture     Problem List Patient Active Problem List   Diagnosis Date Noted  . Morbid obesity with body mass index (BMI) of 50.0 to 59.9 in adult ([place]) 05/29/2018  . OSA on CPAP 05/29/2018  . Class 3 obesity with alveolar hypoventilation, serious comorbidity, and body mass index (BMI) of 50.0 to 59.9 in adult ([place]) 01/12/2018  . Sleep related headaches 01/12/2018  . Intractable chronic cluster headache 01/12/2018  . Excessive daytime sleepiness 01/12/2018  . Sleeps in sitting position due to orthopnea 01/12/2018  . Other fatigue 11/15/2017  . Shortness of breath on exertion 11/15/2017  . Type 2 diabetes mellitus without complication,  without long-term current use of insulin ([place]) 11/15/2017  . Essential hypertension 11/15/2017  . Other hyperlipidemia 11/15/2017  . Allergic rhinitis 10/12/2017  . DOE (dyspnea on exertion)   . Chest pain 08/10/2017  . Type 2 diabetes mellitus with hyperlipidemia ([place]) 08/10/2017  . Personal history of dysmenorrhea 03/10/2017  . Chronic hypertension 02/21/2017  . Dysmenorrhea 01/27/2017  . History of herpes simplex infection 12/18/2013  . Flank pain 09/21/2012  . GERD (gastroesophageal reflux disease) 09/21/2012  . Herpes genitalia 09/21/2012      Geraldine Solar PT, DPT   [facility] [place] [address] [place], [place], [address] Phone: [phone]   Fax:  [phone]  Name: Susan [name] MRN: [id number] Date of Birth: [birth year]

## 2019-01-26 DIAGNOSIS — E118 Type 2 diabetes mellitus with unspecified complications: Secondary | ICD-10-CM | POA: Diagnosis not present

## 2019-01-26 DIAGNOSIS — R5383 Other fatigue: Secondary | ICD-10-CM | POA: Diagnosis not present

## 2019-01-26 DIAGNOSIS — E782 Mixed hyperlipidemia: Secondary | ICD-10-CM | POA: Diagnosis not present

## 2019-01-26 DIAGNOSIS — E559 Vitamin D deficiency, unspecified: Secondary | ICD-10-CM | POA: Diagnosis not present

## 2019-01-30 ENCOUNTER — Ambulatory Visit (HOSPITAL_COMMUNITY): Payer: BC Managed Care – PPO | Admitting: Physical Therapy

## 2019-01-30 ENCOUNTER — Other Ambulatory Visit: Payer: Self-pay

## 2019-01-30 DIAGNOSIS — M5441 Lumbago with sciatica, right side: Secondary | ICD-10-CM

## 2019-01-30 DIAGNOSIS — R293 Abnormal posture: Secondary | ICD-10-CM | POA: Diagnosis not present

## 2019-01-30 NOTE — Therapy (Signed)
[place], [place], [address] Phone: [phone]   Fax:  [phone]  Physical Therapy Treatment  Patient Details  Name: Susan [name] MRN: [id number] Date of Birth: 03-06-1971 Referring Provider (PT): Phylliss Bob, MD Nyoka Lint, Franchot Mimes, FNP)   Encounter Date: 01/30/2019  PT End of Session - 01/30/19 1208    Visit Number  8    Number of Visits  9    Date for PT Re-Evaluation  02/02/19    Authorization Type   Worker's Comp Claim #[id number] DOI:09/19/2018 Approved eval and 8 visist (fax notes to [phone])    Authorization Time Period  10/26/2018 - 11/16/2018; 01/05/19-02/02/19    Authorization - Visit Number  7    Authorization - Number of Visits  8    PT Start Time  0932    PT Stop Time  0918    PT Time Calculation (min)  44 min    Activity Tolerance  Patient tolerated treatment well    Behavior During Therapy  [facility] for tasks assessed/performed       Past Medical History:  Diagnosis Date  . Anxiety   . Diabetes mellitus ([place])   . Fluid retention   . GERD (gastroesophageal reflux disease)   . Heart murmur   . Herpes simplex without mention of complication   . History of herpes simplex infection 12/18/2013  . Hyperlipidemia   . Hypertension   . Ovarian cyst   . Reflux   . Sciatica   . SOB (shortness of breath)     Past Surgical History:  Procedure Laterality Date  . DILATION AND CURETTAGE OF UTERUS    . ENDOMETRIAL ABLATION    . TUBAL LIGATION  2006    There were no vitals filed for this visit.  Subjective Assessment - 01/30/19 0836    Subjective  pt states she is sore in her lower back from working last night.  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    Currently in Pain?  Yes    Pain Score  4     Pain Location  Back    Pain Orientation  Right    Pain Descriptors / Indicators  Sore;Aching    Pain Type  Chronic pain                       [facility] Adult PT  Treatment/Exercise - 01/30/19 0001      Lumbar Exercises: Stretches   Prone Mid Back Stretch Limitations  pec stretch in doorway 3x20-30"      Lumbar Exercises: Machines for Strengthening   Other Lumbar Machine Exercise  hip thrusters 40#, 2x10      Lumbar Exercises: Standing   Wall Slides Limitations  bil hip diagonals GTB 2x15 each    Row Limitations  2x15 5# dumbbells, bent over    Shoulder Extension Limitations  2x15 5# dumbbells, lat flys, bent over    Other Standing Lumbar Exercises  10# deadlifts from 4" step 2x10 reps      Manual Therapy   Manual Therapy  Soft tissue mobilization    Manual therapy comments  Performed seperate from other interventions    Soft tissue mobilization  STM to lumbar paraspinals, distal thoracic paraspinals, QL, and lats to reduce pain and restrictions               PT Short Term Goals - 01/05/19 1518      PT  SHORT TERM GOAL #1   Title  Patient will be independent with HEP, updated PRN, to progress lumbar extensor strengthening and tolerance to flexion postures.    Time  1    Period  Weeks    Status  Achieved    Target Date  11/02/18        PT Long Term Goals - 01/19/19 1008      PT LONG TERM GOAL #1   Title  Patient will improve FOTO score to 45% or less to achieve significant improvement in self reported limitation related to back paind during work and functional daily activitites.    Baseline  01/19/19: 52% limited was 57%    Status  On-going      PT LONG TERM GOAL #2   Title  Patient will perform lumbar AROM testing with no pain provocation to indicate reduced irritability.    Status  On-going      PT LONG TERM GOAL #3   Title  Patient will perform 10 reps of forward flexion with no increase in low back pain or symptom provocation to indicate reduce irritability.    Baseline  01/19/19:  Reports no pain, does feel a pull on Rt side    Status  On-going      PT LONG TERM GOAL #4   Title  Patient will demonstrate proper lifting  mechanics to improve posture and reduce risk of injury at work environment.    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  Pt with good form noted with most exercises this session, only minimal cues needed.  Continued with manual completed in prone to lumbar paraspinals.  Pt reported overall reduced tightness/restrictions at end of session.    Personal Factors and Comorbidities  Comorbidity 3+    Comorbidities  Hypertension, Diabetes Mellitus, Hyperlipidemia, Anxiety    Examination-Activity Limitations  Lift;Bend    Examination-Participation Restrictions  Other   Currently on light duty at work   Stability/Clinical Decision Making  Stable/Uncomplicated    Rehab Potential  Good    PT Frequency  2x / week    PT Duration  4 weeks    PT Treatment/Interventions  ADLs/Self Care Home Management;Aquatic Therapy;Electrical Stimulation;Iontophoresis 4mg /ml Dexamethasone;Cryotherapy;Moist Heat;Traction;Functional mobility training;Therapeutic activities;Therapeutic exercise;Neuromuscular re-education;Patient/family education;Manual techniques;Passive range of motion;Joint Manipulations;Spinal Manipulations;Dry needling    PT Next Visit Plan  re-evaluate next session.    PT Home Exercise Plan  Eval: lumbar extension standing; 01/05/19 - SKTC; 6/9: thoracic extension seated    Consulted and Agree with Plan of Care  Patient       Patient will benefit from skilled therapeutic intervention in order to improve the following deficits and impairments:   Decreased activity tolerance, Decreased endurance, Decreased strength, Difficulty walking, Obesity, Postural dysfunction, Pain, Improper body mechanics, Decreased mobility  Visit Diagnosis: 1. Acute right-sided low back pain with right-sided sciatica   2. Abnormal posture        Problem List  Patient Active Problem List   Diagnosis Date Noted  . Morbid obesity with body mass index (BMI) of 50.0 to 59.9 in adult ([place]) 05/29/2018  . OSA on CPAP 05/29/2018  . Class 3 obesity with alveolar hypoventilation, serious comorbidity, and body mass index (BMI) of 50.0 to 59.9 in adult ([place]) 01/12/2018  . Sleep related headaches 01/12/2018  . Intractable chronic cluster headache 01/12/2018  . Excessive daytime sleepiness 01/12/2018  . Sleeps in sitting position due to orthopnea 01/12/2018  . Other fatigue 11/15/2017  . Shortness of breath on exertion 11/15/2017  . Type 2 diabetes mellitus without complication, without long-term current use of insulin ([place]) 11/15/2017  . Essential hypertension 11/15/2017  . Other hyperlipidemia 11/15/2017  . Allergic rhinitis 10/12/2017  . DOE (dyspnea on exertion)   . Chest pain 08/10/2017  . Type 2 diabetes mellitus with hyperlipidemia ([place]) 08/10/2017  . Personal history of dysmenorrhea 03/10/2017  . Chronic hypertension 02/21/2017  . Dysmenorrhea 01/27/2017  . History of herpes simplex infection 12/18/2013  . Flank pain 09/21/2012  . GERD (gastroesophageal reflux disease) 09/21/2012  . Herpes genitalia 09/21/2012   Teena Irani, PTA/CLT [id number]  Teena Irani 01/30/2019, 12:11 PM  [place], [place], [address] Phone: [phone]   Fax:  [phone]  Name: JEANENE MENA MRN: [id number] Date of Birth: [birth year]

## 2019-02-06 ENCOUNTER — Encounter (HOSPITAL_COMMUNITY): Payer: Self-pay | Admitting: Physical Therapy

## 2019-02-06 ENCOUNTER — Other Ambulatory Visit: Payer: Self-pay

## 2019-02-06 ENCOUNTER — Ambulatory Visit (HOSPITAL_COMMUNITY): Payer: BC Managed Care – PPO | Admitting: Physical Therapy

## 2019-02-06 DIAGNOSIS — M5441 Lumbago with sciatica, right side: Secondary | ICD-10-CM

## 2019-02-06 DIAGNOSIS — R293 Abnormal posture: Secondary | ICD-10-CM | POA: Diagnosis not present

## 2019-02-06 NOTE — Therapy (Signed)
[place], [place], [address] Phone: [phone]   Fax:  [phone]  Physical Therapy Treatment / Re-assessment  Patient Details  Name: Susan [name] MRN: [id number] Date of Birth: March 16, 1971 Referring Provider (PT): Phylliss Bob, MD   Encounter Date: 02/06/2019   Progress Note Reporting Period 01/05/19 to 02/06/19  See note below for Objective Data and Assessment of Progress/Goals.       PT End of Session - 02/06/19 0841    Visit Number  9    Number of Visits  17    Date for PT Re-Evaluation 03/08/19   Authorization Type   Worker's Comp Claim #[id number] DOI:09/19/2018 Approved eval and 8 visist (fax notes to [phone])    Authorization Time Period  10/26/2018 - 11/16/2018; 01/05/19-02/02/19    Authorization - Visit Number  8    Authorization - Number of Visits  8    PT Start Time  [phone]   Patient arrived late   PT Stop Time  0915   Some time unbilled for re-assessment   PT Time Calculation (min)  39 min    Activity Tolerance  Patient tolerated treatment well    Behavior During Therapy  [facility] for tasks assessed/performed       Past Medical History:  Diagnosis Date  . Anxiety   . Diabetes mellitus ([place])   . Fluid retention   . GERD (gastroesophageal reflux disease)   . Heart murmur   . Herpes simplex without mention of complication   . History of herpes simplex infection 12/18/2013  . Hyperlipidemia   . Hypertension   . Ovarian cyst   . Reflux   . Sciatica   . SOB (shortness of breath)     Past Surgical History:  Procedure Laterality Date  . DILATION AND CURETTAGE OF UTERUS    . ENDOMETRIAL ABLATION    . TUBAL LIGATION  2006    There were no vitals filed for this visit.  Subjective Assessment - 02/06/19 0839    Subjective  Patient denied any pain currently and reported she has been doing her HEP.    Currently in Pain?  No/denies         [facility] PT Assessment - 02/06/19 0001      Assessment   Medical Diagnosis  Low Back Pain    Referring Provider (PT)  Phylliss Bob, MD    Onset Date/Surgical Date  09/29/18    Next MD Visit  02/26/19      Precautions   Precautions  None      Restrictions   Weight Bearing Restrictions  No      Observation/Other Assessments   Focus on Therapeutic Outcomes (FOTO)   47% limited   was 52% and 57% limited     AROM   Overall AROM Comments  Repeated lumbar flexion x 10 with no reported pain    Lumbar Flexion  WFL   A little pulling.    Lumbar Extension  25% limited   painful on right side   Lumbar - Right Side Bend  [facility]    Lumbar - Left Side Bend  WFL   pulling on right side                  [facility] Adult PT Treatment/Exercise - 02/06/19 0001      Lumbar Exercises: Stretches   Prone Mid Back Stretch Limitations  pec stretch in corner 3x20-30"      Lumbar Exercises: Standing  Row Limitations  2x15 5# dumbells, bent over    Shoulder Extension Limitations  2x15 5# dumbells lat flys, bent over      Manual Therapy   Manual Therapy  Soft tissue mobilization    Manual therapy comments  Performed seperate from other interventions    Soft tissue mobilization  STM to lumbar paraspinals, distal thoracic paraspinals, QL, and lats to reduce pain and restrictions               PT Short Term Goals - 02/06/19 1610      PT SHORT TERM GOAL #1   Title  Patient will be independent with HEP, updated PRN, to progress lumbar extensor strengthening and tolerance to flexion postures.    Time  1    Period  Weeks    Status  Achieved    Target Date  11/02/18        PT Long Term Goals - 02/06/19 9604      PT LONG TERM GOAL #1   Title  Patient will improve FOTO score to 45% or less to achieve significant improvement in self reported limitation related to back paind during work and functional daily activitites.    Baseline  02/06/19: 52% limited was 57%; now 47% limited    Status  On-going      PT LONG TERM GOAL #2    Title  Patient will perform lumbar AROM testing with no pain provocation to indicate reduced irritability.    Baseline  02/06/19: Still painful with some motions, so objective measures    Status  On-going      PT LONG TERM GOAL #3   Title  Patient will perform 10 reps of forward flexion with no increase in low back pain or symptom provocation to indicate reduce irritability.    Status  Achieved      PT LONG TERM GOAL #4   Title  Patient will demonstrate proper lifting mechanics to improve posture and reduce risk of injury at work environment.    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 Patient achieved short term goal. Patient achieved 2 out of 5 long term goals. Patient continued to report pain with lumbar extension and with sidebending. Patient did demonstrate good lifting mechanics this session and denied pain with repeated lumbar flexion. Remainder of session, patient performed strengthening exercises to improve posture and stability, and ended with soft tissue mobilization to decrease muscular restrictions. Patient would benefit from continued skilled physical therapy in order to continue progressing towards functional goals.    Personal Factors and Comorbidities  Comorbidity 3+    Comorbidities  Hypertension, Diabetes Mellitus, Hyperlipidemia, Anxiety    Examination-Activity Limitations  Lift;Bend    Examination-Participation Restrictions  Other   Currently on light duty at work    Stability/Clinical Decision Making  Stable/Uncomplicated    Rehab Potential  Good    PT Frequency  2x /  week    PT Duration  4 weeks    PT Treatment/Interventions  ADLs/Self Care Home Management;Aquatic Therapy;Electrical Stimulation;Iontophoresis 4mg /ml Dexamethasone;Cryotherapy;Moist Heat;Traction;Functional mobility training;Therapeutic activities;Therapeutic exercise;Neuromuscular re-education;Patient/family education;Manual techniques;Passive range of motion;Joint Manipulations;Spinal Manipulations;Dry needling    PT Next Visit Plan  Continue with postural strengthening; strengthening to improve lifting and manual therapy as needed    PT Home Exercise Plan  Eval: lumbar extension standing; 01/05/19 - SKTC; 6/9: thoracic extension seated    Consulted and Agree with Plan of Care  Patient       Patient will benefit from skilled therapeutic intervention in order to improve the following deficits and impairments:  Decreased activity tolerance, Decreased endurance, Decreased strength, Difficulty walking, Obesity, Postural dysfunction, Pain, Improper body mechanics, Decreased mobility  Visit Diagnosis: 1. Acute right-sided low back pain with right-sided sciatica   2. Abnormal posture        Problem List Patient Active Problem List   Diagnosis Date Noted  . Morbid obesity with body mass index (BMI) of 50.0 to 59.9 in adult ([place]) 05/29/2018  . OSA on CPAP 05/29/2018  . Class 3 obesity with alveolar hypoventilation, serious comorbidity, and body mass index (BMI) of 50.0 to 59.9 in adult ([place]) 01/12/2018  . Sleep related headaches 01/12/2018  . Intractable chronic cluster headache 01/12/2018  . Excessive daytime sleepiness 01/12/2018  . Sleeps in sitting position due to orthopnea 01/12/2018  . Other fatigue 11/15/2017  . Shortness of breath on exertion 11/15/2017  . Type 2 diabetes mellitus without complication, without long-term current use of insulin ([place]) 11/15/2017  . Essential  hypertension 11/15/2017  . Other hyperlipidemia 11/15/2017  . Allergic rhinitis 10/12/2017  . DOE (dyspnea on exertion)   . Chest pain 08/10/2017  . Type 2 diabetes mellitus with hyperlipidemia ([place]) 08/10/2017  . Personal history of dysmenorrhea 03/10/2017  . Chronic hypertension 02/21/2017  . Dysmenorrhea 01/27/2017  . History of herpes simplex infection 12/18/2013  . Flank pain 09/21/2012  . GERD (gastroesophageal reflux disease) 09/21/2012  . Herpes genitalia 09/21/2012   Clarene Critchley PT, DPT 9:30 AM, 02/06/19 [place], [place], [address] Phone: [phone]   Fax:  [phone]  Name: Susan [name] MRN: [id number] Date of Birth: 14-Jan-1971

## 2019-02-08 ENCOUNTER — Ambulatory Visit (HOSPITAL_COMMUNITY): Payer: BC Managed Care – PPO | Admitting: Physical Therapy

## 2019-02-08 ENCOUNTER — Other Ambulatory Visit: Payer: Self-pay

## 2019-02-08 ENCOUNTER — Encounter (HOSPITAL_COMMUNITY): Payer: Self-pay | Admitting: Physical Therapy

## 2019-02-08 DIAGNOSIS — I10 Essential (primary) hypertension: Secondary | ICD-10-CM | POA: Diagnosis not present

## 2019-02-08 DIAGNOSIS — E559 Vitamin D deficiency, unspecified: Secondary | ICD-10-CM | POA: Diagnosis not present

## 2019-02-08 DIAGNOSIS — R293 Abnormal posture: Secondary | ICD-10-CM | POA: Diagnosis not present

## 2019-02-08 DIAGNOSIS — M5441 Lumbago with sciatica, right side: Secondary | ICD-10-CM | POA: Diagnosis not present

## 2019-02-08 DIAGNOSIS — E669 Obesity, unspecified: Secondary | ICD-10-CM | POA: Diagnosis not present

## 2019-02-08 DIAGNOSIS — E118 Type 2 diabetes mellitus with unspecified complications: Secondary | ICD-10-CM | POA: Diagnosis not present

## 2019-02-08 NOTE — Therapy (Signed)
[place], [place], [address] Phone: [phone]   Fax:  [phone]  Physical Therapy Treatment  Patient Details  Name: Susan [name] MRN: [id number] Date of Birth: 07-Sep-1970 Referring Provider (PT): Phylliss Bob, MD   Encounter Date: 02/08/2019  PT End of Session - 02/08/19 0833    Visit Number  10    Number of Visits  17    Date for PT Re-Evaluation  03/08/19    Authorization Type   Worker's Comp Claim #[id number] DOI:09/19/2018 Approved eval and 8 visist (fax notes to [phone])    Authorization Time Period  10/26/2018 - 11/16/2018; 01/05/19-02/02/19    Authorization - Visit Number  1    Authorization - Number of Visits  9    PT Start Time  [phone]   Patient arrived late   PT Stop Time  0900    PT Time Calculation (min)  31 min    Activity Tolerance  Patient tolerated treatment well    Behavior During Therapy  [facility] for tasks assessed/performed       Past Medical History:  Diagnosis Date  . Anxiety   . Diabetes mellitus ([place])   . Fluid retention   . GERD (gastroesophageal reflux disease)   . Heart murmur   . Herpes simplex without mention of complication   . History of herpes simplex infection 12/18/2013  . Hyperlipidemia   . Hypertension   . Ovarian cyst   . Reflux   . Sciatica   . SOB (shortness of breath)     Past Surgical History:  Procedure Laterality Date  . DILATION AND CURETTAGE OF UTERUS    . ENDOMETRIAL ABLATION    . TUBAL LIGATION  2006    There were no vitals filed for this visit.  Subjective Assessment - 02/08/19 0831    Subjective  Patient stated she does not have any pain currently, mostly discomfort.    Currently in Pain?  No/denies                       [facility] Adult PT Treatment/Exercise - 02/08/19 0001      Lumbar Exercises: Stretches   Prone Mid Back Stretch Limitations  pec stretch in corner 3x20-30"    Other Lumbar Stretch Exercise  QL stretch 3x30''  seated      Lumbar Exercises: Standing   Row Limitations  2x15 5# dumbells,    Shoulder Extension Limitations  2x15 5# dumbells lat flys, bent over    Other Standing Lumbar Exercises  Pallof press semi-tandem position x 20 each direction    Other Standing Lumbar Exercises  10# deadlifts from 4" step 2x10 reps      Manual Therapy   Manual Therapy  Soft tissue mobilization    Manual therapy comments  Performed seperate from other interventions    Soft tissue mobilization  Using red weighted ball, STM to lumbar paraspinals, distal thoracic paraspinals, QL, and lats to reduce pain and restrictions               PT Short Term Goals - 02/06/19 9622      PT SHORT TERM GOAL #1   Title  Patient will be independent with HEP, updated PRN, to progress lumbar extensor strengthening and tolerance to flexion postures.    Time  1    Period  Weeks    Status  Achieved    Target Date  11/02/18  PT Long Term Goals - 02/06/19 0160      PT LONG TERM GOAL #1   Title  Patient will improve FOTO score to 45% or less to achieve significant improvement in self reported limitation related to back paind during work and functional daily activitites.    Baseline  02/06/19: 52% limited was 57%; now 47% limited    Status  On-going      PT LONG TERM GOAL #2   Title  Patient will perform lumbar AROM testing with no pain provocation to indicate reduced irritability.    Baseline  02/06/19: Still painful with some motions, so objective measures    Status  On-going      PT LONG TERM GOAL #3   Title  Patient will perform 10 reps of forward flexion with no increase in low back pain or symptom provocation to indicate reduce irritability.    Status  Achieved      PT LONG TERM GOAL #4   Title  Patient will demonstrate proper lifting mechanics to improve posture and reduce risk of injury at work environment.    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 In addition, added pallof press this session to improve strength of musculature which prevents rotation at the spine patient performed while in a partial tandem position. Patient required minimal cueing and demonstration to perform exercises with proper form. Patient would benefit from continued skilled physical therapy in order to continue progressing towards functional goals.    Personal Factors and Comorbidities  Comorbidity 3+    Comorbidities  Hypertension, Diabetes Mellitus, Hyperlipidemia, Anxiety    Examination-Activity Limitations  Lift;Bend    Examination-Participation Restrictions  Other   Currently on light duty at work   Stability/Clinical Decision Making  Stable/Uncomplicated    Rehab Potential  Good    PT Frequency  2x / week    PT Duration  4 weeks    PT Treatment/Interventions  ADLs/Self Care Home Management;Aquatic Therapy;Electrical Stimulation;Iontophoresis 4mg /ml Dexamethasone;Cryotherapy;Moist Heat;Traction;Functional mobility training;Therapeutic activities;Therapeutic exercise;Neuromuscular re-education;Patient/family education;Manual techniques;Passive range of motion;Joint Manipulations;Spinal Manipulations;Dry needling    PT Next Visit Plan  Continue with postural strengthening; strengthening to improve lifting and manual therapy as needed. Consider adding D2 flexion with theraband next session.    PT Home Exercise Plan  Eval: lumbar extension standing; 01/05/19 - SKTC; 6/9:  thoracic extension seated    Consulted and Agree with Plan of Care  Patient       Patient will benefit from skilled therapeutic intervention in order to improve the following deficits and impairments:  Decreased activity tolerance, Decreased endurance, Decreased strength, Difficulty walking, Obesity, Postural dysfunction, Pain, Improper body mechanics, Decreased mobility  Visit Diagnosis: 1. Acute right-sided low back pain with right-sided sciatica   2. Abnormal posture        Problem List Patient Active Problem List   Diagnosis  Date Noted  . Morbid obesity with body mass index (BMI) of 50.0 to 59.9 in adult ([place]) 05/29/2018  . OSA on CPAP 05/29/2018  . Class 3 obesity with alveolar hypoventilation, serious comorbidity, and body mass index (BMI) of 50.0 to 59.9 in adult ([place]) 01/12/2018  . Sleep related headaches 01/12/2018  . Intractable chronic cluster headache 01/12/2018  . Excessive daytime sleepiness 01/12/2018  . Sleeps in sitting position due to orthopnea 01/12/2018  . Other fatigue 11/15/2017  . Shortness of breath on exertion 11/15/2017  . Type 2 diabetes mellitus without complication, without long-term current use of insulin ([place]) 11/15/2017  . Essential hypertension 11/15/2017  . Other hyperlipidemia 11/15/2017  . Allergic rhinitis 10/12/2017  . DOE (dyspnea on exertion)   . Chest pain 08/10/2017  . Type 2 diabetes mellitus with hyperlipidemia ([place]) 08/10/2017  . Personal history of dysmenorrhea 03/10/2017  . Chronic hypertension 02/21/2017  . Dysmenorrhea 01/27/2017  . History of herpes simplex infection 12/18/2013  . Flank pain 09/21/2012  . GERD (gastroesophageal reflux disease) 09/21/2012  . Herpes genitalia 09/21/2012   Clarene Critchley PT, DPT 9:12 AM, 02/08/19 [place], [place], [address] Phone: [phone]   Fax:  [phone]  Name: AUNDRAYA DRIPPS MRN: [id number] Date of  Birth: [birth year]

## 2019-02-12 ENCOUNTER — Other Ambulatory Visit: Payer: Self-pay

## 2019-02-12 ENCOUNTER — Ambulatory Visit (HOSPITAL_COMMUNITY): Payer: BC Managed Care – PPO

## 2019-02-12 ENCOUNTER — Encounter (HOSPITAL_COMMUNITY): Payer: Self-pay

## 2019-02-12 DIAGNOSIS — M5441 Lumbago with sciatica, right side: Secondary | ICD-10-CM | POA: Diagnosis not present

## 2019-02-12 DIAGNOSIS — R293 Abnormal posture: Secondary | ICD-10-CM

## 2019-02-12 NOTE — Therapy (Signed)
[place], [place], [address] Phone: [phone]   Fax:  [phone]  Physical Therapy Treatment  Patient Details  Name: Susan [name] MRN: [id number] Date of Birth: 02/20/1971 Referring Provider (PT): Phylliss Bob, MD   Encounter Date: 02/12/2019  PT End of Session - 02/12/19 0822    Visit Number  11    Number of Visits  17    Date for PT Re-Evaluation  03/08/19    Authorization Type   Worker's Comp Claim #[id number] DOI:09/19/2018 Approved eval and 8 visist (fax notes to [phone])    Authorization Time Period  10/26/2018 - 11/16/2018; 01/05/19-02/02/19    Authorization - Visit Number  2    Authorization - Number of Visits  9    PT Start Time  0822   pt late   PT Stop Time  0902    PT Time Calculation (min)  40 min    Activity Tolerance  Patient tolerated treatment well    Behavior During Therapy  [facility] for tasks assessed/performed       Past Medical History:  Diagnosis Date  . Anxiety   . Diabetes mellitus ([place])   . Fluid retention   . GERD (gastroesophageal reflux disease)   . Heart murmur   . Herpes simplex without mention of complication   . History of herpes simplex infection 12/18/2013  . Hyperlipidemia   . Hypertension   . Ovarian cyst   . Reflux   . Sciatica   . SOB (shortness of breath)     Past Surgical History:  Procedure Laterality Date  . DILATION AND CURETTAGE OF UTERUS    . ENDOMETRIAL ABLATION    . TUBAL LIGATION  2006    There were no vitals filed for this visit.  Subjective Assessment - 02/12/19 0823    Subjective  Pt states that she is doing well. She is not in pain, just some back soreness.    Currently in Pain?  No/denies            [facility] Adult PT Treatment/Exercise - 02/12/19 0001      Lumbar Exercises: Stretches   Prone Mid Back Stretch Limitations  pec stretch in corner 3x20-30"    Other Lumbar Stretch Exercise  QL stretch 3x30'' standing in doorway      Lumbar  Exercises: Machines for Strengthening   Other Lumbar Machine Exercise  scap retraction, 40# 2x10      Lumbar Exercises: Standing   Wall Slides Limitations  bil hip diagonals GTB 2x10 each    Row Limitations  2x10 8# dumbells, bent over    Shoulder Adduction Limitations  sidestepping GTB 70ft x2RT    Other Standing Lumbar Exercises  Good mornings with 5# bar    Other Standing Lumbar Exercises  20# deadlifts 2x10 reps      Lumbar Exercises: Prone   Other Prone Lumbar Exercises  bil Ys for lower trap, x10 rep (cues to reduce compensations)      Manual Therapy   Manual Therapy  Soft tissue mobilization    Manual therapy comments  Performed seperate from other interventions    Soft tissue mobilization  STM to lumbar paraspinals, distal thoracic paraspinals, QL, and lats to reduce pain and restrictions             PT Education - 02/12/19 7253    Education Details  exercise technique, updated HEP    Person(s) Educated  Patient    Methods  Explanation;Demonstration;Handout    Comprehension  Verbalized understanding;Returned demonstration       PT Short Term Goals - 02/06/19 9371      PT SHORT TERM GOAL #1   Title  Patient will be independent with HEP, updated PRN, to progress lumbar extensor strengthening and tolerance to flexion postures.    Time  1    Period  Weeks    Status  Achieved    Target Date  11/02/18        PT Long Term Goals - 02/06/19 6967      PT LONG TERM GOAL #1   Title  Patient will improve FOTO score to 45% or less to achieve significant improvement in self reported limitation related to back paind during work and functional daily activitites.    Baseline  02/06/19: 52% limited was 57%; now 47% limited    Status  On-going      PT LONG TERM GOAL #2   Title  Patient will perform lumbar AROM testing with no pain provocation to indicate reduced irritability.    Baseline  02/06/19: Still painful with some motions, so objective measures    Status  On-going       PT LONG TERM GOAL #3   Title  Patient will perform 10 reps of forward flexion with no increase in low back pain or symptom provocation to indicate reduce irritability.    Status  Achieved      PT LONG TERM GOAL #4   Title  Patient will demonstrate proper lifting mechanics to improve posture and reduce risk of injury at work environment.    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 Assessed lower back mm and while she still had some mm tightness and mild spasms, didn't feel like pt required dry needling this date. Session focused on general mobility, hip, and lower back strengthening this date. Added good mornings and lat pulldowns for lower back strengthening. Pt noted to have upper trap compensation during other back strengthening exercises so added lower trap strength in prone; pt very challenged with this AEB almost an entire full body compensation to raise arms up off table. Ended with manual STM to lower back and surrounding mm; pt with palpable knot at distal paraspinals, etc. Educated pt on how to perform self-STM with tennis ball at home to address this and she verbalized understanding.    Personal Factors and Comorbidities  Comorbidity 3+    Comorbidities  Hypertension, Diabetes Mellitus, Hyperlipidemia, Anxiety    Examination-Activity Limitations  Lift;Bend    Examination-Participation Restrictions  Other   Currently on light duty at work   Stability/Clinical Decision Making   Stable/Uncomplicated    Rehab Potential  Good    PT Frequency  2x / week    PT Duration  4 weeks    PT Treatment/Interventions  ADLs/Self Care Home Management;Aquatic Therapy;Electrical Stimulation;Iontophoresis 4mg /ml Dexamethasone;Cryotherapy;Moist Heat;Traction;Functional mobility  training;Therapeutic activities;Therapeutic exercise;Neuromuscular re-education;Patient/family education;Manual techniques;Passive range of motion;Joint Manipulations;Spinal Manipulations;Dry needling    PT Next Visit Plan  continue lat pulldowns and lower trap strength; Continue with postural strengthening; strengthening to improve lifting and manual therapy as needed. Consider adding D2 flexion with theraband next session.    PT Home Exercise Plan  Eval: lumbar extension standing; 01/05/19 - SKTC; 6/9: thoracic extension seated; 6/29: sidelying hip abd, sidelying clams GTB, standing hip diagonals GTB, sidestepping GTB    Consulted and Agree with Plan of Care  Patient       Patient will benefit from skilled therapeutic intervention in order to improve the following deficits and impairments:  Decreased activity tolerance, Decreased endurance, Decreased strength, Difficulty walking, Obesity, Postural dysfunction, Pain, Improper body mechanics, Decreased mobility  Visit Diagnosis: 1. Acute right-sided low back pain with right-sided sciatica   2. Abnormal posture        Problem List Patient Active Problem List   Diagnosis Date Noted  . Morbid obesity with body mass index (BMI) of 50.0 to 59.9 in adult ([place]) 05/29/2018  . OSA on CPAP 05/29/2018  . Class 3 obesity with alveolar hypoventilation, serious comorbidity, and body mass index (BMI) of 50.0 to 59.9 in adult ([place]) 01/12/2018  . Sleep related headaches 01/12/2018  . Intractable chronic cluster headache 01/12/2018  . Excessive daytime sleepiness 01/12/2018  . Sleeps in sitting position due to orthopnea 01/12/2018  . Other fatigue 11/15/2017  . Shortness of  breath on exertion 11/15/2017  . Type 2 diabetes mellitus without complication, without long-term current use of insulin ([place]) 11/15/2017  . Essential hypertension 11/15/2017  . Other hyperlipidemia 11/15/2017  . Allergic rhinitis 10/12/2017  . DOE (dyspnea on exertion)   . Chest pain 08/10/2017  . Type 2 diabetes mellitus with hyperlipidemia ([place]) 08/10/2017  . Personal history of dysmenorrhea 03/10/2017  . Chronic hypertension 02/21/2017  . Dysmenorrhea 01/27/2017  . History of herpes simplex infection 12/18/2013  . Flank pain 09/21/2012  . GERD (gastroesophageal reflux disease) 09/21/2012  . Herpes genitalia 09/21/2012       Geraldine Solar PT, DPT   [place] [address] [place], [place], [address] Phone: [phone]   Fax:  [phone]  Name: Susan [name] MRN: [id number] Date of Birth: 1970/10/26

## 2019-02-14 ENCOUNTER — Ambulatory Visit (HOSPITAL_COMMUNITY): Payer: BC Managed Care – PPO

## 2019-02-14 ENCOUNTER — Telehealth (HOSPITAL_COMMUNITY): Payer: Self-pay | Admitting: Family Medicine

## 2019-02-14 NOTE — Telephone Encounter (Signed)
pt left a message at 8:02 to say she was cancelling because she has an upset stomach   02/14/19

## 2019-02-19 ENCOUNTER — Other Ambulatory Visit: Payer: Self-pay

## 2019-02-19 ENCOUNTER — Ambulatory Visit (HOSPITAL_COMMUNITY): Payer: BC Managed Care – PPO | Attending: Orthopedic Surgery

## 2019-02-19 ENCOUNTER — Encounter (HOSPITAL_COMMUNITY): Payer: Self-pay

## 2019-02-19 DIAGNOSIS — M5441 Lumbago with sciatica, right side: Secondary | ICD-10-CM | POA: Diagnosis not present

## 2019-02-19 DIAGNOSIS — R293 Abnormal posture: Secondary | ICD-10-CM | POA: Diagnosis not present

## 2019-02-19 NOTE — Therapy (Signed)
[place], [place], [address] Phone: [phone]   Fax:  [phone]  Physical Therapy Treatment  Patient Details  Name: Susan [name] MRN: [id number] Date of Birth: 1971/04/12 Referring Provider (PT): Phylliss Bob, MD   Encounter Date: 02/19/2019  PT End of Session - 02/19/19 0822    Visit Number  12    Number of Visits  17    Date for PT Re-Evaluation  03/08/19    Authorization Type   Worker's Comp Claim #[id number] DOI:09/19/2018 Approved eval and 8 visist (fax notes to [phone])    Authorization Time Period  10/26/2018 - 11/16/2018; 01/05/19-02/02/19    Authorization - Visit Number  3    Authorization - Number of Visits  9    PT Start Time  0822   pt arrived lated   PT Stop Time  0901    PT Time Calculation (min)  39 min    Activity Tolerance  Patient tolerated treatment well    Behavior During Therapy  [facility] for tasks assessed/performed       Past Medical History:  Diagnosis Date  . Anxiety   . Diabetes mellitus ([place])   . Fluid retention   . GERD (gastroesophageal reflux disease)   . Heart murmur   . Herpes simplex without mention of complication   . History of herpes simplex infection 12/18/2013  . Hyperlipidemia   . Hypertension   . Ovarian cyst   . Reflux   . Sciatica   . SOB (shortness of breath)     Past Surgical History:  Procedure Laterality Date  . DILATION AND CURETTAGE OF UTERUS    . ENDOMETRIAL ABLATION    . TUBAL LIGATION  2006    There were no vitals filed for this visit.  Subjective Assessment - 02/19/19 0822    Subjective  Pt reports she is feeling good today. Pt reports "same old numbness stuff" when asked about back pain. Pt reports she worked this weekend without issues.    Limitations  Other (comment)    How long can you sit comfortably?  back gets stiff if she doesn't get up to stretch     How long can you stand comfortably?  back gets stiff if after prolonged standing     How long can you walk comfortably?  15 minutes    Patient Stated Goals  to alleviated the "pushing pain" in her lower back    Currently in Pain?  No/denies         [facility] Adult PT Treatment/Exercise - 02/19/19 0001      Lumbar Exercises: Stretches   Passive Hamstring Stretch  3 reps;30 seconds    Prone Mid Back Stretch Limitations  pec stretch in corner 3x30 sec    Other Lumbar Stretch Exercise  QL stretch 3x30'' standing in doorway    Other Lumbar Stretch Exercise  seated forward flexion, 3x30 sec      Lumbar Exercises: Standing   Scapular Retraction  Both;10 reps    Scapular Retraction Limitations  BTB    Shoulder Extension  Both;10 reps    Shoulder Extension Limitations  BTB    Other Standing Lumbar Exercises  heel taps, 4" step, x10 reps BLE    Other Standing Lumbar Exercises  20# deadlifts 2x10 reps; standing hip abd and ext, 2x10 reps each      Lumbar Exercises: Seated   LAQ on Ball Limitations  2x10 reps, BUE support then BUE across chest  verbal cues for decreased speed   Hip Flexion on Ball Limitations  2x10 reps, BUE support then BUE across chest      Lumbar Exercises: Quadruped   [place]  10 reps    Single Arm Raise  10 reps    Straight Leg Raise  10 reps    Opposite Arm/Leg Raise  10 reps;3 seconds         PT Education - 02/19/19 0903    Education Details  exercise technique, reviewed/continue HEP with new verbal cues    Person(s) Educated  Patient    Methods  Explanation;Demonstration    Comprehension  Verbalized understanding;Returned demonstration       PT Short Term Goals - 02/06/19 8016      PT SHORT TERM GOAL #1   Title  Patient will be independent with HEP, updated PRN, to progress lumbar extensor strengthening and tolerance to flexion postures.    Time  1    Period  Weeks    Status  Achieved    Target Date  11/02/18        PT Long Term Goals - 02/06/19 5537      PT LONG TERM GOAL #1   Title  Patient will improve FOTO score to  45% or less to achieve significant improvement in self reported limitation related to back paind during work and functional daily activitites.    Baseline  02/06/19: 52% limited was 57%; now 47% limited    Status  On-going      PT LONG TERM GOAL #2   Title  Patient will perform lumbar AROM testing with no pain provocation to indicate reduced irritability.    Baseline  02/06/19: Still painful with some motions, so objective measures    Status  On-going      PT LONG TERM GOAL #3   Title  Patient will perform 10 reps of forward flexion with no increase in low back pain or symptom provocation to indicate reduce irritability.    Status  Achieved      PT LONG TERM GOAL #4   Title  Patient will demonstrate proper lifting mechanics to improve posture and reduce risk of injury at work environment.    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 Pt tolerates quadruped work with focus on core contraction for stabilization throughout pelvis while moving extremities on stable trunk. Verbal cues for maintaining core contraction to reduce lumbar hyperextension and prevent pain. Added core stability on unstable surface using therapy ball to challenge core with moving extremities and maintaining balance. Continued standing BLE hip and quad strengthening with verbal cues to for posterior tilt (tuck hips under) to protect back, engage core, and  strengthen glutes appropriately. Pt reports improved in glute contraction and reduction in discomfort with tuck hips under cues. Continued stretching to maintain tissue extensibility and for pain reduction  after exercises. Reviewed exercise technique with HEP and corrected to improve posture and reduce pain. Continue to progress pt as tolerable.    Personal Factors and Comorbidities  Comorbidity 3+    Comorbidities  Hypertension, Diabetes Mellitus, Hyperlipidemia, Anxiety    Examination-Activity Limitations  Lift;Bend    Examination-Participation Restrictions  Other   Currently on light duty at work   Stability/Clinical Decision Making  Stable/Uncomplicated    Rehab Potential  Good    PT Frequency  2x / week    PT Duration  4 weeks    PT Treatment/Interventions  ADLs/Self Care Home Management;Aquatic Therapy;Electrical Stimulation;Iontophoresis 4mg /ml Dexamethasone;Cryotherapy;Moist Heat;Traction;Functional mobility training;Therapeutic activities;Therapeutic exercise;Neuromuscular re-education;Patient/family education;Manual techniques;Passive range of motion;Joint Manipulations;Spinal Manipulations;Dry needling    PT Next Visit Plan  Continue core in quadruped, lat pulldowns and lower trap strength, postural strengthening; strengthening to improve lifting and techniques; manual therapy as needed. Consider adding D2 flexion with theraband next session.    PT Home Exercise Plan  Eval: lumbar extension standing; 01/05/19 - SKTC; 6/9: thoracic extension seated; 6/29: sidelying hip abd, sidelying clams GTB, standing hip diagonals GTB, sidestepping GTB    Consulted and Agree with Plan of Care  Patient       Patient will benefit from skilled therapeutic intervention in order to improve the following deficits and impairments:  Decreased activity tolerance, Decreased endurance, Decreased strength, Difficulty walking, Obesity, Postural dysfunction, Pain, Improper body mechanics, Decreased mobility  Visit  Diagnosis: 1. Acute right-sided low back pain with right-sided sciatica   2. Abnormal posture        Problem List Patient Active Problem List   Diagnosis Date Noted  . Morbid obesity with body mass index (BMI) of 50.0 to 59.9 in adult ([place]) 05/29/2018  . OSA on CPAP 05/29/2018  . Class 3 obesity with alveolar hypoventilation, serious comorbidity, and body mass index (BMI) of 50.0 to 59.9 in adult ([place]) 01/12/2018  . Sleep related headaches 01/12/2018  . Intractable chronic cluster headache 01/12/2018  . Excessive daytime sleepiness 01/12/2018  . Sleeps in sitting position due to orthopnea 01/12/2018  . Other fatigue 11/15/2017  . Shortness of breath on exertion 11/15/2017  . Type 2 diabetes mellitus without complication, without long-term current use of insulin ([place]) 11/15/2017  . Essential hypertension 11/15/2017  . Other hyperlipidemia 11/15/2017  . Allergic rhinitis 10/12/2017  . DOE (dyspnea on exertion)   . Chest pain 08/10/2017  . Type 2 diabetes mellitus with hyperlipidemia ([place]) 08/10/2017  . Personal history of dysmenorrhea 03/10/2017  . Chronic hypertension 02/21/2017  . Dysmenorrhea 01/27/2017  . History of herpes simplex infection 12/18/2013  . Flank pain 09/21/2012  . GERD (gastroesophageal reflux disease) 09/21/2012  . Herpes genitalia 09/21/2012    Talbot Grumbling PT, DPT  [place], [place], [address] Phone: [phone]   Fax:  [phone]  Name: ARABEL BARCENAS MRN: [id number] Date of Birth: [birth year]

## 2019-02-22 ENCOUNTER — Ambulatory Visit (HOSPITAL_COMMUNITY): Payer: BC Managed Care – PPO

## 2019-02-22 ENCOUNTER — Other Ambulatory Visit: Payer: Self-pay

## 2019-02-22 ENCOUNTER — Encounter (HOSPITAL_COMMUNITY): Payer: Self-pay

## 2019-02-22 DIAGNOSIS — R293 Abnormal posture: Secondary | ICD-10-CM

## 2019-02-22 DIAGNOSIS — M5441 Lumbago with sciatica, right side: Secondary | ICD-10-CM | POA: Diagnosis not present

## 2019-02-22 NOTE — Therapy (Signed)
[facility] [place], [place], [address] Phone: [phone]   Fax:  [phone]  Physical Therapy Treatment  Patient Details  Name: Susan [name] MRN: [id number] Date of Birth: 10/23/1970 Referring Provider (PT): Phylliss Bob, MD   Encounter Date: 02/22/2019  PT End of Session - 02/22/19 0822    Visit Number  13    Number of Visits  17    Date for PT Re-Evaluation  03/08/19    Authorization Type   Worker's Comp Claim #[id number] DOI:09/19/2018 Approved eval and 8 visist (fax notes to [phone])    Authorization Time Period  10/26/2018 - 11/16/2018; 01/05/19-02/02/19    Authorization - Visit Number  4    Authorization - Number of Visits  9    PT Start Time  0822   pt arrived late   PT Stop Time  0905    PT Time Calculation (min)  43 min    Activity Tolerance  Patient tolerated treatment well    Behavior During Therapy  [facility] for tasks assessed/performed       Past Medical History:  Diagnosis Date  . Anxiety   . Diabetes mellitus ([place])   . Fluid retention   . GERD (gastroesophageal reflux disease)   . Heart murmur   . Herpes simplex without mention of complication   . History of herpes simplex infection 12/18/2013  . Hyperlipidemia   . Hypertension   . Ovarian cyst   . Reflux   . Sciatica   . SOB (shortness of breath)     Past Surgical History:  Procedure Laterality Date  . DILATION AND CURETTAGE OF UTERUS    . ENDOMETRIAL ABLATION    . TUBAL LIGATION  2006    There were no vitals filed for this visit.  Subjective Assessment - 02/22/19 0822    Subjective  Pt states that she is still having the numbing pain in R lower back. Rates 4/10 today.    Limitations  Other (comment)    How long can you sit comfortably?  back gets stiff if she doesn't get up to stretch     How long can you stand comfortably?  back gets stiff if after prolonged standing    How long can you walk comfortably?  15 minutes    Patient Stated  Goals  to alleviated the "pushing pain" in her lower back    Currently in Pain?  Yes    Pain Score  4     Pain Location  Back    Pain Orientation  Right    Pain Descriptors / Indicators  Aching;Sore    Pain Type  Chronic pain    Pain Onset  More than a month ago    Pain Frequency  Intermittent    Aggravating Factors   forward bending, walking prolonged time    Pain Relieving Factors  heat    Effect of Pain on Daily Activities  mild              [facility] Adult PT Treatment/Exercise - 02/22/19 0001      Lumbar Exercises: Stretches   Prone Mid Back Stretch Limitations  pec stretch in doorway 3x30 sec    Other Lumbar Stretch Exercise  QL stretch 3x30'' standing in doorway      Lumbar Exercises: Standing   Side Lunge Limitations  farmer's carries 10# DB x1 lap holding in each UE    Push / Pull Sled  bil SLS +pulldowns purple  band 10x3" holds each    Wall Slides Limitations  wall push-ups 2x10     Scapular Retraction  Both;10 reps    Theraband Level (Scapular Retraction)  Level 4 (Blue)    Scapular Retraction Limitations  2 sets    Shoulder Extension  Both;10 reps    Theraband Level (Shoulder Extension)  Level 4 (Blue)    Shoulder Extension Limitations  2 sets    Shoulder Adduction Limitations  Y's on wall with liftoff 2x20 reps (cues to reduce UT compensation)      Lumbar Exercises: Quadruped   Opposite Arm/Leg Raise  10 reps;3 seconds    Plank  bil 1/2 kneeling +lifts 5# DB x10 reps each and +paloff press GTB 2x10 reps each    Other Quadruped Lumbar Exercises  firehydrants GTB 2x10 reps    Other Quadruped Lumbar Exercises  birddog rowing x10 reps each UE with 8# DB             PT Education - 02/22/19 1749    Education Details  exercise technique, updated HEP    Person(s) Educated  Patient    Methods  Explanation;Demonstration;Handout    Comprehension  Verbalized understanding;Returned demonstration       PT Short Term Goals - 02/06/19 4496      PT SHORT TERM  GOAL #1   Title  Patient will be independent with HEP, updated PRN, to progress lumbar extensor strengthening and tolerance to flexion postures.    Time  1    Period  Weeks    Status  Achieved    Target Date  11/02/18        PT Long Term Goals - 02/06/19 7591      PT LONG TERM GOAL #1   Title  Patient will improve FOTO score to 45% or less to achieve significant improvement in self reported limitation related to back paind during work and functional daily activitites.    Baseline  02/06/19: 52% limited was 57%; now 47% limited    Status  On-going      PT LONG TERM GOAL #2   Title  Patient will perform lumbar AROM testing with no pain provocation to indicate reduced irritability.    Baseline  02/06/19: Still painful with some motions, so objective measures    Status  On-going      PT LONG TERM GOAL #3   Title  Patient will perform 10 reps of forward flexion with no increase in low back pain or symptom provocation to indicate reduce irritability.    Status  Achieved      PT LONG TERM GOAL #4   Title  Patient will demonstrate proper lifting mechanics to improve posture and reduce risk of injury at work environment.    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 Min cues for form throughout session but overall tolerated well. Continued and progressed  quadruped work this date; added birddog rowing and Higher education careers adviser in quadruped. Added y's on wall with liftoff for lower trap  strengthening; pt tolerated and performed this much better than in prone. Also added farmer's carries with 10# weight for core strength and anti-lateral flexion moment as well as 1/2 kneeling for core strength and hip stability. Overall, pt tolerated session well, just reporting fatigue at EOS. Pt reports still having knot in lower back; consider resuming manual therapy to address this in future sessions. Continue as planned, progressing as able.    Personal Factors and Comorbidities  Comorbidity 3+    Comorbidities  Hypertension, Diabetes Mellitus, Hyperlipidemia, Anxiety    Examination-Activity Limitations  Lift;Bend    Examination-Participation Restrictions  Other   Currently on light duty at work   Stability/Clinical Decision Making  Stable/Uncomplicated    Rehab Potential  Good    PT Frequency  2x / week    PT Duration  4 weeks    PT Treatment/Interventions  ADLs/Self Care Home Management;Aquatic Therapy;Electrical Stimulation;Iontophoresis 4mg /ml Dexamethasone;Cryotherapy;Moist Heat;Traction;Functional mobility training;Therapeutic activities;Therapeutic exercise;Neuromuscular re-education;Patient/family education;Manual techniques;Passive range of motion;Joint Manipulations;Spinal Manipulations;Dry needling    PT Next Visit Plan  Continue core in quadruped core and lower trap strength, postural strengthening; strengthening to improve lifting and techniques; consider resuming manual therapy for lower back mm restrictions    PT Home Exercise Plan  Eval: lumbar extension standing; 01/05/19 - SKTC; 6/9: thoracic extension seated; 6/29: sidelying hip abd, sidelying clams GTB, standing hip diagonals GTB, sidestepping GTB; 7/9: GH ext and scap retraction BTB, qped fire hydrants GTB    Consulted and Agree with Plan of Care  Patient       Patient will benefit from skilled therapeutic intervention in order to improve the following deficits and impairments:  Decreased activity tolerance, Decreased  endurance, Decreased strength, Difficulty walking, Obesity, Postural dysfunction, Pain, Improper body mechanics, Decreased mobility  Visit Diagnosis: 1. Acute right-sided low back pain with right-sided sciatica   2. Abnormal posture        Problem List Patient Active Problem List   Diagnosis Date Noted  . Morbid obesity with body mass index (BMI) of 50.0 to 59.9 in adult ([place]) 05/29/2018  . OSA on CPAP 05/29/2018  . Class 3 obesity with alveolar hypoventilation, serious comorbidity, and body mass index (BMI) of 50.0 to 59.9 in adult ([place]) 01/12/2018  . Sleep related headaches 01/12/2018  . Intractable chronic cluster headache 01/12/2018  . Excessive daytime sleepiness 01/12/2018  . Sleeps in sitting position due to orthopnea 01/12/2018  . Other fatigue 11/15/2017  . Shortness of breath on exertion 11/15/2017  . Type 2 diabetes mellitus without complication, without long-term current use of insulin ([place]) 11/15/2017  . Essential hypertension 11/15/2017  . Other hyperlipidemia 11/15/2017  . Allergic rhinitis 10/12/2017  . DOE (dyspnea on exertion)   . Chest pain 08/10/2017  . Type 2 diabetes mellitus with hyperlipidemia ([place]) 08/10/2017  . Personal history of dysmenorrhea 03/10/2017  . Chronic hypertension 02/21/2017  . Dysmenorrhea 01/27/2017  . History of herpes simplex infection 12/18/2013  . Flank pain 09/21/2012  . GERD (gastroesophageal reflux disease) 09/21/2012  . Herpes genitalia 09/21/2012        Geraldine Solar PT, DPT   [facility] [address] [place], [place], [address] Phone: [phone]   Fax:  [phone]  Name: ARRIA NAIM MRN: [id number] Date of Birth: 1970/10/27

## 2019-02-26 ENCOUNTER — Encounter (HOSPITAL_COMMUNITY): Payer: Self-pay

## 2019-02-26 ENCOUNTER — Ambulatory Visit (HOSPITAL_COMMUNITY): Payer: BC Managed Care – PPO

## 2019-02-26 ENCOUNTER — Other Ambulatory Visit: Payer: Self-pay

## 2019-02-26 DIAGNOSIS — M5441 Lumbago with sciatica, right side: Secondary | ICD-10-CM

## 2019-02-26 DIAGNOSIS — R293 Abnormal posture: Secondary | ICD-10-CM | POA: Diagnosis not present

## 2019-02-26 NOTE — Therapy (Signed)
[name], [place], [address] Phone: [phone]   Fax:  [phone]  Physical Therapy Treatment  Patient Details  Name: Susan [name] MRN: [id number] Date of Birth: 1970-11-21 Referring Provider (PT): Phylliss Bob, MD   Encounter Date: 02/26/2019  PT End of Session - 02/26/19 1203    Visit Number  14    Number of Visits  17    Date for PT Re-Evaluation  03/08/19    Authorization Type   Worker's Comp Claim #[id number] DOI:09/19/2018 Approved eval and 8 visist (fax notes to [phone])    Authorization Time Period  10/26/2018 - 11/16/2018; 01/05/19-02/02/19; 06/23-->03/08/19    Authorization - Visit Number  5    Authorization - Number of Visits  9    PT Start Time  1118    PT Stop Time  1200    PT Time Calculation (min)  42 min    Activity Tolerance  Patient tolerated treatment well    Behavior During Therapy  [facility] for tasks assessed/performed       Past Medical History:  Diagnosis Date  . Anxiety   . Diabetes mellitus ([place])   . Fluid retention   . GERD (gastroesophageal reflux disease)   . Heart murmur   . Herpes simplex without mention of complication   . History of herpes simplex infection 12/18/2013  . Hyperlipidemia   . Hypertension   . Ovarian cyst   . Reflux   . Sciatica   . SOB (shortness of breath)     Past Surgical History:  Procedure Laterality Date  . DILATION AND CURETTAGE OF UTERUS    . ENDOMETRIAL ABLATION    . TUBAL LIGATION  2006    There were no vitals filed for this visit.  Subjective Assessment - 02/26/19 1125    Subjective  Went to MD this morning and plans to continue therapy for 3-4 more weeks.  Current pain scale 3/10 Rt side of shoulder blade and lower back    Patient Stated Goals  to alleviated the "pushing pain" in her lower back    Currently in Pain?  No/denies                       [facility] Adult PT Treatment/Exercise - 02/26/19 0001      Lumbar  Exercises: Stretches   Quadruped Mid Back Stretch  2 reps;30 seconds    Quadruped Mid Back Stretch Limitations  child's pose    Prone Mid Back Stretch Limitations  pec stretch in doorway 3x30 sec    Other Lumbar Stretch Exercise  QL stretch 3x30'' standing in doorway      Lumbar Exercises: Standing   Side Lunge Limitations  farmer's carries 10# DB x1 lap holding in each UE    Push / Pull Sled  bil SLS +pulldowns purple band 10x3" holds each    Wall Slides Limitations  wall push-ups 2x10     Scapular Retraction  Both;10 reps    Theraband Level (Scapular Retraction)  Level 4 (Blue)    Scapular Retraction Limitations  2 sets    Shoulder Extension  Both;10 reps    Theraband Level (Shoulder Extension)  Level 4 (Blue)    Shoulder Extension Limitations  2 sets; performed SLS    Shoulder Adduction Limitations  Y's on wall with lift off 2x 20 (cueing to reduce compensation    Other Standing Lumbar Exercises  Lats pull down 2x 10 GTB  Lumbar Exercises: Quadruped   Opposite Arm/Leg Raise  10 reps;3 seconds    Plank  bil 1/2 kneeling +lifts 5# DB x10 reps each and +paloff press GTB 2x10 reps each    Other Quadruped Lumbar Exercises  firehydrants GTB 2x10 reps    Other Quadruped Lumbar Exercises  birddog rowing x10 reps each UE with 5# DB               PT Short Term Goals - 02/06/19 4034      PT SHORT TERM GOAL #1   Title  Patient will be independent with HEP, updated PRN, to progress lumbar extensor strengthening and tolerance to flexion postures.    Time  1    Period  Weeks    Status  Achieved    Target Date  11/02/18        PT Long Term Goals - 02/06/19 7425      PT LONG TERM GOAL #1   Title  Patient will improve FOTO score to 45% or less to achieve significant improvement in self reported limitation related to back paind during work and functional daily activitites.    Baseline  02/06/19: 52% limited was 57%; now 47% limited    Status  On-going      PT LONG TERM GOAL  #2   Title  Patient will perform lumbar AROM testing with no pain provocation to indicate reduced irritability.    Baseline  02/06/19: Still painful with some motions, so objective measures    Status  On-going      PT LONG TERM GOAL #3   Title  Patient will perform 10 reps of forward flexion with no increase in low back pain or symptom provocation to indicate reduce irritability.    Status  Achieved      PT LONG TERM GOAL #4   Title  Patient will demonstrate proper lifting mechanics to improve posture and reduce risk of injury at work environment.    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  Added lower trap/lats strengthening exercises and resumed child's with IR to address lower musculature tightness.  Pt able to complete all exercises with min cueing to reduce UE compensation with postural exercises.  Did reduce weight with rows to reduce compensation and improve form with exercise. No reoprts of pain through session, was limited by [place].    Personal Factors and Comorbidities  Comorbidity 3+    Comorbidities  Hypertension, Diabetes Mellitus, Hyperlipidemia, Anxiety    Examination-Activity Limitations  Lift;Bend    Examination-Participation Restrictions  Other   Currently on light duty at work   Stability/Clinical Decision Making  Stable/Uncomplicated    Clinical Decision Making  Low    Rehab Potential  Good    PT Frequency  2x / week    PT Duration  4 weeks     PT Treatment/Interventions  ADLs/Self Care  Home Management;Aquatic Therapy;Electrical Stimulation;Iontophoresis 4mg /ml Dexamethasone;Cryotherapy;Moist Heat;Traction;Functional mobility training;Therapeutic activities;Therapeutic exercise;Neuromuscular re-education;Patient/family education;Manual techniques;Passive range of motion;Joint Manipulations;Spinal Manipulations;Dry needling    PT Next Visit Plan  Continue core in quadruped core and lower trap strength, postural strengthening; strengthening to improve lifting and techniques; consider resuming manual therapy for lower back mm restrictions    PT Home Exercise Plan  Eval: lumbar extension standing; 01/05/19 - SKTC; 6/9: thoracic extension seated; 6/29: sidelying hip abd, sidelying clams GTB, standing hip diagonals GTB, sidestepping GTB; 7/9: [place] ext and scap retraction BTB, qped fire hydrants GTB       Patient will benefit from skilled therapeutic intervention in order to improve the following deficits and impairments:  Decreased activity tolerance, Decreased endurance, Decreased strength, Difficulty walking, Obesity, Postural dysfunction, Pain, Improper body mechanics, Decreased mobility  Visit Diagnosis: 1. Abnormal posture   2. Acute right-sided low back pain with right-sided sciatica        Problem List Patient Active Problem List   Diagnosis Date Noted  . Morbid obesity with body mass index (BMI) of 50.0 to 59.9 in adult ([place]) 05/29/2018  . OSA on CPAP 05/29/2018  . Class 3 obesity with alveolar hypoventilation, serious comorbidity, and body mass index (BMI) of 50.0 to 59.9 in adult ([place]) 01/12/2018  . Sleep related headaches 01/12/2018  . Intractable chronic cluster headache 01/12/2018  . Excessive daytime sleepiness 01/12/2018  . Sleeps in sitting position due to orthopnea 01/12/2018  . Other fatigue 11/15/2017  . Shortness of breath on exertion 11/15/2017  . Type 2 diabetes mellitus without complication, without long-term  current use of insulin ([place]) 11/15/2017  . Essential hypertension 11/15/2017  . Other hyperlipidemia 11/15/2017  . Allergic rhinitis 10/12/2017  . DOE (dyspnea on exertion)   . Chest pain 08/10/2017  . Type 2 diabetes mellitus with hyperlipidemia ([place]) 08/10/2017  . Personal history of dysmenorrhea 03/10/2017  . Chronic hypertension 02/21/2017  . Dysmenorrhea 01/27/2017  . History of herpes simplex infection 12/18/2013  . Flank pain 09/21/2012  . GERD (gastroesophageal reflux disease) 09/21/2012  . Herpes genitalia 09/21/2012   Ihor Austin, [place]; [place]  Aldona Lento 02/26/2019, 12:11 PM  [place], [place], [address] Phone: [phone]   Fax:  [phone]  Name: Susan [name] MRN: [id number] Date of Birth: 12-Apr-1971

## 2019-02-28 ENCOUNTER — Other Ambulatory Visit: Payer: Self-pay

## 2019-02-28 ENCOUNTER — Ambulatory Visit (HOSPITAL_COMMUNITY): Payer: BC Managed Care – PPO

## 2019-02-28 ENCOUNTER — Encounter (HOSPITAL_COMMUNITY): Payer: Self-pay

## 2019-02-28 DIAGNOSIS — M5441 Lumbago with sciatica, right side: Secondary | ICD-10-CM

## 2019-02-28 DIAGNOSIS — R293 Abnormal posture: Secondary | ICD-10-CM

## 2019-02-28 NOTE — Therapy (Signed)
[place], [place], [address] Phone: [phone]   Fax:  [phone]  Physical Therapy Treatment  Patient Details  Name: Susan [name] MRN: [id number] Date of Birth: June 27, 1971 Referring Provider (PT): Phylliss Bob, MD   Encounter Date: 02/28/2019  PT End of Session - 02/28/19 0826    Visit Number  15    Number of Visits  17    Date for PT Re-Evaluation  03/08/19    Authorization Type   Worker's Comp Claim #[id number] DOI:09/19/2018 Approved eval and 8 visist (fax notes to [phone])    Authorization Time Period  10/26/2018 - 11/16/2018; 01/05/19-02/02/19; 06/23-->03/08/19    Authorization - Visit Number  6    Authorization - Number of Visits  9    PT Start Time  0822    PT Stop Time  0902    PT Time Calculation (min)  40 min    Activity Tolerance  Patient tolerated treatment well    Behavior During Therapy  [facility] for tasks assessed/performed       Past Medical History:  Diagnosis Date  . Anxiety   . Diabetes mellitus ([place])   . Fluid retention   . GERD (gastroesophageal reflux disease)   . Heart murmur   . Herpes simplex without mention of complication   . History of herpes simplex infection 12/18/2013  . Hyperlipidemia   . Hypertension   . Ovarian cyst   . Reflux   . Sciatica   . SOB (shortness of breath)     Past Surgical History:  Procedure Laterality Date  . DILATION AND CURETTAGE OF UTERUS    . ENDOMETRIAL ABLATION    . TUBAL LIGATION  2006    There were no vitals filed for this visit.  Subjective Assessment - 02/28/19 0823    Subjective  Pt reports doctor wants her to do therapy for a few more weeks. Pt reports feeling well today and no pain.    Limitations  Other (comment)    How long can you sit comfortably?  back gets stiff if she doesn't get up to stretch     How long can you stand comfortably?  back gets stiff if after prolonged standing    How long can you walk comfortably?  15 minutes     Patient Stated Goals  to alleviated the "pushing pain" in her lower back    Currently in Pain?  No/denies    Pain Onset  More than a month ago            [facility] Adult PT Treatment/Exercise - 02/28/19 0001      Lumbar Exercises: Stretches   Other Lumbar Stretch Exercise  QL stretch 3x30'' standing in doorway      Lumbar Exercises: Standing   Side Lunge Limitations  farmer's carries 10# DB x1 lap holding in each UE    Shoulder Extension  Both;10 reps    Theraband Level (Shoulder Extension)  Level 4 (Blue)    Shoulder Extension Limitations  2 sets, TA activation      Other Standing Lumbar Exercises  lifts both directions in tandem stance, RTB, x10 reps each; chops both directions tandem stance, RTB, x10 reps each    Other Standing Lumbar Exercises  tandem stance with paloff press, GTB, x10 reps      Lumbar Exercises: Supine   Ab Set  10 reps    AB Set Limitations  TA contraction with exhale    Other  Supine Lumbar Exercises  Hooklying alternating marching with TA contraction, x10 reps; hooklying alternating heelslides with TA contraction, x10 reps      Lumbar Exercises: Quadruped   Plank  inclined plank with BUE on table and feet on floor, 2x15 sec    Other Quadruped Lumbar Exercises  birddog, x10 reps each             PT Education - 02/28/19 0826    Education Details  Exercise technique, updated HEP    Person(s) Educated  Patient    Methods  Explanation;Demonstration;Verbal cues;Handout    Comprehension  Verbalized understanding;Returned demonstration       PT Short Term Goals - 02/06/19 6213      PT SHORT TERM GOAL #1   Title  Patient will be independent with HEP, updated PRN, to progress lumbar extensor strengthening and tolerance to flexion postures.    Time  1    Period  Weeks    Status  Achieved    Target Date  11/02/18        PT Long Term Goals - 02/06/19 0865      PT LONG TERM GOAL #1   Title  Patient will improve FOTO score to 45% or less to  achieve significant improvement in self reported limitation related to back paind during work and functional daily activitites.    Baseline  02/06/19: 52% limited was 57%; now 47% limited    Status  On-going      PT LONG TERM GOAL #2   Title  Patient will perform lumbar AROM testing with no pain provocation to indicate reduced irritability.    Baseline  02/06/19: Still painful with some motions, so objective measures    Status  On-going      PT LONG TERM GOAL #3   Title  Patient will perform 10 reps of forward flexion with no increase in low back pain or symptom provocation to indicate reduce irritability.    Status  Achieved      PT LONG TERM GOAL #4   Title  Patient will demonstrate proper lifting mechanics to improve posture and reduce risk of injury at work environment.    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 Progressed core stabilization exercises with TA contraction using exhalation assist to ensure no breath holding and improve core activation. Continued strengthening with BLE marching and heelslides in hooklying position with core activated to maintain stable pelvis with moving extremities. Added chops and lifts with resistance bands cues pt to activate core and avoid lumbar hyperextension with moving BUE. Ended with inclined plank to again strengthen isometric core contraction and pt tolerated for 15 sec hold with verbal cues to tuck hips  under and avoid lower back hyperextension. Pt continues to deny low back pain at end of session. Pt would continue to benefit from skilled PT to  improve strength, endurance, reduce pain and improve overall QoL.    Personal Factors and Comorbidities  Comorbidity 3+    Comorbidities  Hypertension, Diabetes Mellitus, Hyperlipidemia, Anxiety    Examination-Activity Limitations  Lift;Bend    Examination-Participation Restrictions  Other   Currently on light duty at work   Stability/Clinical Decision Making  Stable/Uncomplicated    Rehab Potential  Good    PT Frequency  2x / week    PT Duration  4 weeks    PT Treatment/Interventions  ADLs/Self Care Home Management;Aquatic Therapy;Electrical Stimulation;Iontophoresis 4mg /ml Dexamethasone;Cryotherapy;Moist Heat;Traction;Functional mobility training;Therapeutic activities;Therapeutic exercise;Neuromuscular re-education;Patient/family education;Manual techniques;Passive range of motion;Joint Manipulations;Spinal Manipulations;Dry needling    PT Next Visit Plan  Continue core in quadruped and standing and add isometric hold in modified plank positions; lower trap strength and postural strengthening; strengthening to improve lifting and techniques; consider resuming manual therapy for lower back mm restrictions    PT Home Exercise Plan  Eval: lumbar extension standing; 01/05/19 - SKTC; 6/9: thoracic extension seated; 6/29: sidelying hip abd, sidelying clams GTB, standing hip diagonals GTB, sidestepping GTB; 7/9: [place] ext and scap retraction BTB, qped fire hydrants GTB; 7/15: TA activation, TA with alternating heel slides, TA with alternating marching    Consulted and Agree with Plan of Care  Patient       Patient will benefit from skilled therapeutic intervention in order to improve the following deficits and impairments:  Decreased activity tolerance, Decreased endurance, Decreased strength, Difficulty walking, Obesity, Postural dysfunction, Pain, Improper body  mechanics, Decreased mobility  Visit Diagnosis: 1. Abnormal posture   2. Acute right-sided low back pain with right-sided sciatica        Problem List Patient Active Problem List   Diagnosis Date Noted  . Morbid obesity with body mass index (BMI) of 50.0 to 59.9 in adult ([place]) 05/29/2018  . OSA on CPAP 05/29/2018  . Class 3 obesity with alveolar hypoventilation, serious comorbidity, and body mass index (BMI) of 50.0 to 59.9 in adult ([place]) 01/12/2018  . Sleep related headaches 01/12/2018  . Intractable chronic cluster headache 01/12/2018  . Excessive daytime sleepiness 01/12/2018  . Sleeps in sitting position due to orthopnea 01/12/2018  . Other fatigue 11/15/2017  . Shortness of breath on exertion 11/15/2017  . Type 2 diabetes mellitus without complication, without long-term current use of insulin ([place]) 11/15/2017  . Essential hypertension 11/15/2017  . Other hyperlipidemia 11/15/2017  . Allergic rhinitis 10/12/2017  . DOE (dyspnea on exertion)   . Chest pain 08/10/2017  . Type 2 diabetes mellitus with hyperlipidemia ([place]) 08/10/2017  . Personal history of dysmenorrhea 03/10/2017  . Chronic hypertension 02/21/2017  . Dysmenorrhea 01/27/2017  . History of herpes simplex infection 12/18/2013  . Flank pain 09/21/2012  . GERD (gastroesophageal reflux disease) 09/21/2012  . Herpes genitalia 09/21/2012      Talbot Grumbling PT, DPT   [place], [place], [address] Phone: [phone]   Fax:  [phone]  Name: Susan [name] MRN: [id number] Date of Birth: [birth year]

## 2019-03-05 ENCOUNTER — Telehealth (HOSPITAL_COMMUNITY): Payer: Self-pay

## 2019-03-05 NOTE — Telephone Encounter (Signed)
L/m request return phone call to schedule the 9 more visits that have been approved. Pt left Friday w/o scheduling any future appointments. NF 03/05/2019

## 2019-03-07 ENCOUNTER — Telehealth (HOSPITAL_COMMUNITY): Payer: Self-pay

## 2019-03-07 NOTE — Telephone Encounter (Signed)
Pt called l/m to schedule. I called patient back and l/m for her to call us back to schedule.We are playing phone tag.

## 2019-03-08 ENCOUNTER — Telehealth (HOSPITAL_COMMUNITY): Payer: Self-pay

## 2019-03-08 NOTE — Telephone Encounter (Signed)
Called twice and Emailed Ms Susan [name] request a phone call back to confirm the 9 apptments -Patient states Ms. Susan [name] Worker's Comp case manager  was with her on the MD visit and that everyone agreed that 9 more visit would be approved . NF 03/09/2019

## 2019-03-08 NOTE — Telephone Encounter (Signed)
Ms. Susan [name] called back stated she had e-mailed approval to Susan [name] twice -7/13 and 7/15, she could not understand why it was not in the chart. Ms. Susan [name] forwarded the approval to be and it has been scanned into pt's chart-6visits approved. NF 7/23/

## 2019-03-09 ENCOUNTER — Telehealth (HOSPITAL_COMMUNITY): Payer: Self-pay

## 2019-03-09 ENCOUNTER — Encounter (HOSPITAL_COMMUNITY): Payer: Self-pay

## 2019-03-09 ENCOUNTER — Ambulatory Visit (HOSPITAL_COMMUNITY): Payer: BC Managed Care – PPO
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
## 2019-03-13 ENCOUNTER — Encounter (HOSPITAL_COMMUNITY): Payer: Self-pay | Admitting: Physical Therapy

## 2019-03-13 ENCOUNTER — Other Ambulatory Visit: Payer: Self-pay

## 2019-03-13 ENCOUNTER — Ambulatory Visit (HOSPITAL_COMMUNITY): Payer: BC Managed Care – PPO | Admitting: Physical Therapy

## 2019-03-13 DIAGNOSIS — M5441 Lumbago with sciatica, right side: Secondary | ICD-10-CM | POA: Diagnosis not present

## 2019-03-13 DIAGNOSIS — R293 Abnormal posture: Secondary | ICD-10-CM

## 2019-03-13 NOTE — Therapy (Signed)
[place] [address] [place], [place], [address] Phone: [phone]   Fax:  [phone]  Patient Details  Name: Susan [name] MRN: [id number] Date of Birth: Apr 05, 1971 Referring Provider:  No ref. provider found  Encounter Date: 03/13/2019   DC 03/13/19- see today's treatment note for details. Patient in agreement. All further appointments cancelled.    Deniece Ree PT, DPT, CBIS  Supplemental Physical Therapist [facility]    Pager [phone] Acute Rehab Office [place] [address] [place], [place], [address] Phone: [phone]   Fax:  [phone]

## 2019-03-13 NOTE — Therapy (Signed)
[place], [place], [address] Phone: [phone]   Fax:  [phone]  Physical Therapy Treatment  Patient Details  Name: Susan [name] MRN: [id number] Date of Birth: Feb 16, 1971 Referring Provider (PT): Phylliss Bob   PHYSICAL THERAPY DISCHARGE SUMMARY  Visits from Start of Care: 16  Current functional level related to goals / functional outcomes: Patient has met all set goals and reports she is doing very well functionally, does not express concerns about functional status or task performance at this time.    Remaining deficits: Mild muscle pulling with lumbar AROM, reduced functional activity tolerance    Education / Equipment: Review of HEP, ways to modify HEP around work schedule  Plan: Patient agrees to discharge.  Patient goals were met. Patient is being discharged due to meeting the stated rehab goals.  ?????      Encounter Date: 03/13/2019  PT End of Session - 03/13/19 0955    Visit Number  16    Number of Visits  17    Date for PT Re-Evaluation  03/13/19    Authorization Type   Worker's Comp Claim #[id number] DOI:09/19/2018 Approved eval and 8 visist (fax notes to [phone])    Authorization Time Period  10/26/2018 - 11/16/2018; 01/05/19-02/02/19; 35/57-->11/04/00; recert done 5/42    PT Start Time  7062   patient late   PT Stop Time  0947    PT Time Calculation (min)  23 min    Activity Tolerance  Patient tolerated treatment well    Behavior During Therapy  [facility] for tasks assessed/performed       Past Medical History:  Diagnosis Date  . Anxiety   . Diabetes mellitus ([place])   . Fluid retention   . GERD (gastroesophageal reflux disease)   . Heart murmur   . Herpes simplex without mention of complication   . History of herpes simplex infection 12/18/2013  . Hyperlipidemia   . Hypertension   . Ovarian cyst   . Reflux   . Sciatica   . SOB (shortness of breath)     Past Surgical History:   Procedure Laterality Date  . DILATION AND CURETTAGE OF UTERUS    . ENDOMETRIAL ABLATION    . TUBAL LIGATION  2006    There were no vitals filed for this visit.  Subjective Assessment - 03/13/19 0926    Subjective  Patient is doing well, would like to continue with PT. Bending is still difficult, I still get stiff as well.    How long can you sit comfortably?  back gets stiff if she doesn't get up to stretch     How long can you stand comfortably?  back gets stiff if after prolonged standing    How long can you walk comfortably?  25-30 minutes    Patient Stated Goals  to alleviated the "pushing pain" in her lower back    Currently in Pain?  No/denies         [facility] PT Assessment - 03/13/19 0001      Assessment   Medical Diagnosis  Low back pain     Referring Provider (PT)  Phylliss Bob     Onset Date/Surgical Date  09/29/18    Next MD Visit  8/720      Precautions   Precautions  None      Restrictions   Weight Bearing Restrictions  No      Balance Screen   Has the patient fallen in  the past 6 months  No    Has the patient had a decrease in activity level because of a fear of falling?   No      Prior Function   Level of Independence  Independent    Vocation  Full time employment    Software engineer     Leisure  used to walk for exercise       AROM   Lumbar Flexion  WNL    mild pulling    Lumbar Extension  WNL    trending to hinging    Lumbar - Right Side Bend  WNL     Lumbar - Left Side Bend  WNL       Strength   Right Hip Flexion  5/5    Right Hip Extension  5/5    Right Hip ABduction  5/5    Left Hip Flexion  5/5    Left Hip ABduction  5/5    Right Knee Flexion  5/5    Right Knee Extension  4+/5    Left Knee Flexion  5/5    Left Knee Extension  4+/5    Right Ankle Dorsiflexion  5/5    Left Ankle Dorsiflexion  5/5      FABER test   findings  Negative      other   Findings  Negative      Pelvic Dictraction   Findings  Negative                            PT Education - 03/13/19 0954    Education Details  DC today, reviewed HEP, discussed ways to modify HEP around work schedule    Person(s) Educated  Patient    Methods  Explanation    Comprehension  Verbalized understanding       PT Short Term Goals - 03/13/19 0935      PT SHORT TERM GOAL #1   Title  Patient will be independent with HEP, updated PRN, to progress lumbar extensor strengthening and tolerance to flexion postures.    Time  1    Period  Weeks    Status  Achieved    Target Date  11/02/18        PT Long Term Goals - 03/13/19 0935      PT LONG TERM GOAL #1   Title  Patient will improve FOTO score to 45% or less to achieve significant improvement in self reported limitation related to back paind during work and functional daily activitites.    Baseline  03/13/19- 40% limited    Time  3    Period  Weeks    Status  Achieved      PT LONG TERM GOAL #2   Title  Patient will perform lumbar AROM testing with no pain provocation to indicate reduced irritability.    Time  3    Period  Weeks    Status  Achieved      PT LONG TERM GOAL #3   Title  Patient will perform 10 reps of forward flexion with no increase in low back pain or symptom provocation to indicate reduce irritability.    Baseline  01/19/19:  Reports no pain, does feel a pull on Rt side    Time  3    Period  Weeks    Status  Achieved      PT LONG TERM GOAL #4   Title  Patient will demonstrate proper lifting mechanics to improve posture and reduce risk of injury at work environment.    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 Patient performing well on all objective tests and measures, and appears to have met all goals at this time. She is unable to express any activities or tasks that she has trouble with or is concerned about, and reports that she is feeling well. DC today due to all goals met and patient being satisfied with current level of function.    Personal Factors and Comorbidities  Comorbidity 3+    Comorbidities  Hypertension, Diabetes Mellitus, Hyperlipidemia, Anxiety    Examination-Activity Limitations  Lift;Bend    Rehab Potential  Good    PT Frequency  2x / week    PT Duration  4 weeks    PT Treatment/Interventions  ADLs/Self Care Home Management;Aquatic Therapy;Electrical Stimulation;Iontophoresis 104m/ml Dexamethasone;Cryotherapy;Moist Heat;Traction;Functional mobility training;Therapeutic activities;Therapeutic exercise;Neuromuscular re-education;Patient/family education;Manual techniques;Passive range of motion;Joint Manipulations;Spinal Manipulations;Dry needling    PT Next Visit Plan  DC today    PT Home Exercise Plan  Eval: lumbar extension standing; 01/05/19 - SKTC; 6/9: thoracic extension seated; 6/29: sidelying hip abd, sidelying clams GTB, standing hip diagonals GTB, sidestepping GTB; 7/9: [place] and scap retraction BTB, qped fire hydrants GTB; 7/15: TA activation, TA with alternating heel slides, TA with alternating marching    Consulted and Agree with Plan of Care  Patient       Patient will benefit from skilled therapeutic intervention in order to improve the following deficits and impairments:  Decreased activity tolerance, Decreased endurance, Decreased strength, Difficulty walking, Obesity, Postural dysfunction, Pain, Improper body mechanics, Decreased mobility  Visit Diagnosis: 1. Abnormal posture   2. Acute right-sided low back pain with right-sided  sciatica        Problem List Patient Active Problem List   Diagnosis Date Noted  . Morbid obesity with body mass index (BMI) of 50.0 to 59.9 in adult ([place] 05/29/2018  . OSA on CPAP 05/29/2018  . Class 3 obesity with alveolar hypoventilation, serious comorbidity, and body mass index (BMI) of 50.0 to 59.9 in adult ([place] 01/12/2018  . Sleep related headaches 01/12/2018  . Intractable chronic cluster headache 01/12/2018  . Excessive daytime sleepiness 01/12/2018  . Sleeps in sitting position due to orthopnea 01/12/2018  . Other fatigue 11/15/2017  . Shortness of breath on exertion 11/15/2017  . Type 2 diabetes mellitus without complication, without long-term current use of insulin ([place] 11/15/2017  . Essential hypertension 11/15/2017  . Other hyperlipidemia 11/15/2017  . Allergic rhinitis 10/12/2017  . DOE (dyspnea on exertion)   . Chest pain 08/10/2017  . Type 2 diabetes mellitus with hyperlipidemia ([place] 08/10/2017  . Personal history of dysmenorrhea 03/10/2017  . Chronic hypertension 02/21/2017  . Dysmenorrhea 01/27/2017  . History of herpes simplex infection 12/18/2013  . Flank pain 09/21/2012  .  GERD (gastroesophageal reflux disease) 09/21/2012  . Herpes genitalia 09/21/2012    Deniece Ree PT, DPT, CBIS  Supplemental Physical Therapist [facility]    Pager [phone] Acute Rehab Office [place] [address] [place], [place], [address] Phone: [phone]   Fax:  [phone]  Name: Susan [name] MRN: [id number] Date of Birth: March 02, 1971

## 2019-03-16 ENCOUNTER — Ambulatory Visit (HOSPITAL_COMMUNITY): Payer: BC Managed Care – PPO | Admitting: Physical Therapy

## 2019-03-19 ENCOUNTER — Ambulatory Visit (HOSPITAL_COMMUNITY): Payer: BC Managed Care – PPO | Admitting: Physical Therapy

## 2019-03-23 ENCOUNTER — Ambulatory Visit (HOSPITAL_COMMUNITY): Payer: BC Managed Care – PPO

## 2019-03-27 ENCOUNTER — Encounter (HOSPITAL_COMMUNITY): Payer: BC Managed Care – PPO | Admitting: Physical Therapy

## 2019-03-30 ENCOUNTER — Encounter (HOSPITAL_COMMUNITY): Payer: BC Managed Care – PPO

## 2019-04-02 ENCOUNTER — Encounter (HOSPITAL_COMMUNITY): Payer: BC Managed Care – PPO | Admitting: Physical Therapy

## 2019-04-02 DIAGNOSIS — F419 Anxiety disorder, unspecified: Secondary | ICD-10-CM | POA: Diagnosis not present

## 2019-04-02 DIAGNOSIS — N951 Menopausal and female climacteric states: Secondary | ICD-10-CM | POA: Diagnosis not present

## 2019-04-06 ENCOUNTER — Encounter (HOSPITAL_COMMUNITY): Payer: BC Managed Care – PPO | Admitting: Physical Therapy

## 2019-05-14 DIAGNOSIS — E559 Vitamin D deficiency, unspecified: Secondary | ICD-10-CM | POA: Diagnosis not present

## 2019-05-14 DIAGNOSIS — R5383 Other fatigue: Secondary | ICD-10-CM | POA: Diagnosis not present

## 2019-05-14 DIAGNOSIS — E118 Type 2 diabetes mellitus with unspecified complications: Secondary | ICD-10-CM | POA: Diagnosis not present

## 2019-05-14 DIAGNOSIS — I10 Essential (primary) hypertension: Secondary | ICD-10-CM | POA: Diagnosis not present

## 2019-05-14 DIAGNOSIS — Z23 Encounter for immunization: Secondary | ICD-10-CM | POA: Diagnosis not present

## 2019-05-14 DIAGNOSIS — E782 Mixed hyperlipidemia: Secondary | ICD-10-CM | POA: Diagnosis not present

## 2019-06-01 ENCOUNTER — Other Ambulatory Visit (HOSPITAL_COMMUNITY)
Admission: RE | Admit: 2019-06-01 | Discharge: 2019-06-01 | Disposition: A | Discharge status: Home / Self Care | Payer: BC Managed Care – PPO | Source: Ambulatory Visit | Attending: Obstetrics & Gynecology | Admitting: Obstetrics & Gynecology

## 2019-06-01 ENCOUNTER — Ambulatory Visit (INDEPENDENT_AMBULATORY_CARE_PROVIDER_SITE_OTHER): Payer: BC Managed Care – PPO | Admitting: Women's Health

## 2019-06-01 ENCOUNTER — Other Ambulatory Visit: Payer: Self-pay

## 2019-06-01 ENCOUNTER — Encounter: Payer: Self-pay | Admitting: Women's Health

## 2019-06-01 VITALS — BP 116/62 | HR 69 | Ht 60.0 in | Wt 258.2 lb

## 2019-06-01 DIAGNOSIS — D219 Benign neoplasm of connective and other soft tissue, unspecified: Secondary | ICD-10-CM

## 2019-06-01 DIAGNOSIS — Z01419 Encounter for gynecological examination (general) (routine) without abnormal findings: Secondary | ICD-10-CM | POA: Insufficient documentation

## 2019-06-01 NOTE — Progress Notes (Signed)
   WELL-WOMAN EXAMINATION Patient name: Susan [name] MRN [id number]  Date of birth: 04-Nov-1970 Chief Complaint:   Gynecologic Exam (pap/physcial)  History of Present Illness:   Susan [name] is a 48 y.o. G36P2012 African American female being seen today for a routine well-woman exam.  Current complaints: fibroids hurt her when time for period. Periods have become irregular, usually skips a month. Hot flahses, mood swings. Mom 49yo when she went through menopause.   PCP: Dr. Mannie Stabile, now at [facility]      does not desire labs, done w/ PCP last month Patient's last menstrual period was 03/26/2019. The current method of family planning is tubal ligation.  Last pap 01/17/17. Results were: neg w/ -HRHPV, pt wants to do a pap today Last mammogram: 2 yrs ago. Results were: normal. Family h/o breast cancer: No Last colonoscopy: never. Results were: n/a. Family h/o colorectal cancer: Yes grandfather Review of Systems:   Pertinent items are noted in HPI Denies any headaches, blurred vision, fatigue, shortness of breath, chest pain, abdominal pain, abnormal vaginal discharge/itching/odor/irritation, problems with periods, bowel movements, urination, or intercourse unless otherwise stated above. Pertinent History Reviewed:  Reviewed past medical,surgical, social and family history.  Reviewed problem list, medications and allergies. Physical Assessment:   Vitals:   06/01/19 0844  BP: 116/62  Pulse: 69  Weight: 258 lb 3.2 oz (117.1 kg)  Height: 5' (1.524 m)  Body mass index is 50.43 kg/m.        Physical Examination by Maryagnes Amos, SNM  General appearance - well appearing, and in no distress  Mental status - alert, oriented to person, place, and time  Psych:  She has a normal mood and affect  Skin - warm and dry, normal color, no suspicious lesions noted  Chest - effort normal, all lung fields clear to auscultation bilaterally  Heart - normal rate and regular rhythm  Neck:  midline trachea,  no thyromegaly or nodules  Breasts - breasts appear normal, no suspicious masses, no skin or nipple changes or  axillary nodes  Abdomen - soft, nontender, nondistended, no masses or organomegaly  Pelvic - VULVA: normal appearing vulva with no masses, tenderness or lesions  VAGINA: normal appearing vagina with normal color and discharge, no lesions  CERVIX: normal appearing cervix without discharge or lesions, no CMT  Thin prep pap is done w/ HR HPV cotesting  UTERUS: uterus is felt to be normal size, shape, consistency and nontender   ADNEXA: No adnexal masses or tenderness noted.  Extremities:  No swelling or varicosities noted  No results found for this or any previous visit (from the past 24 hour(s)).  Assessment & Plan:  1) Well-Woman Exam  2) Known h/o fibroids> hurts prior to periods. Wants to get another u/s to check up on fibroids, has been 60yrs. Will call when next period starts to schedule u/s for when period ends  Labs/procedures today: pap  Mammogram-now, 56yr past due Colonoscopy- recommended to start @ 48yo d/t AA, gave local GI numbers to call  No orders of the defined types were placed in this encounter.   Meds: No orders of the defined types were placed in this encounter.   Follow-up: Return for call when period starts to get pelvic u/s when period ends, then 66yr for physical.  Roma Schanz CNM, [facility] 06/01/2019 9:32 AM

## 2019-06-01 NOTE — Patient Instructions (Signed)
Schedule mammogram [id number]  [facility] (Dr. Gala Romney & Dr. Oneida Alar) [phone]  Dr. Laural Golden [phone]

## 2019-06-05 ENCOUNTER — Telehealth: Payer: Self-pay | Admitting: Neurology

## 2019-06-05 ENCOUNTER — Ambulatory Visit: Payer: BLUE CROSS/BLUE SHIELD | Admitting: Neurology

## 2019-06-05 NOTE — Telephone Encounter (Signed)
Pt was no show to follow up visit apt

## 2019-06-07 LAB — CYTOLOGY - PAP
Comment: NEGATIVE
Diagnosis: NEGATIVE
High risk HPV: NEGATIVE

## 2019-11-15 DIAGNOSIS — E782 Mixed hyperlipidemia: Secondary | ICD-10-CM | POA: Diagnosis not present

## 2019-11-15 DIAGNOSIS — E1169 Type 2 diabetes mellitus with other specified complication: Secondary | ICD-10-CM | POA: Diagnosis not present

## 2019-11-15 DIAGNOSIS — E559 Vitamin D deficiency, unspecified: Secondary | ICD-10-CM | POA: Diagnosis not present

## 2019-11-15 DIAGNOSIS — I10 Essential (primary) hypertension: Secondary | ICD-10-CM | POA: Diagnosis not present

## 2019-11-28 ENCOUNTER — Encounter: Payer: Self-pay | Admitting: Orthopedic Surgery

## 2019-11-28 ENCOUNTER — Other Ambulatory Visit: Payer: Self-pay

## 2019-11-28 ENCOUNTER — Ambulatory Visit (INDEPENDENT_AMBULATORY_CARE_PROVIDER_SITE_OTHER): Payer: BC Managed Care – PPO | Admitting: Orthopedic Surgery

## 2019-11-28 VITALS — BP 143/76 | HR 89 | Ht 60.0 in | Wt 258.0 lb

## 2019-11-28 DIAGNOSIS — S83002A Unspecified subluxation of left patella, initial encounter: Secondary | ICD-10-CM | POA: Diagnosis not present

## 2019-11-28 NOTE — Progress Notes (Signed)
Chief Complaint  Patient presents with  . 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  X-rays were taken they were normal she was placed on crutches given ibuprofen and Vicodin and told to see an orthopedist on her return home  She complains of swelling and pain left knee  She denies any skin lesions or rashes numbness or tingling in the left leg  Past Medical History:  Diagnosis Date  . Anxiety   . Diabetes mellitus ([place])   . Fluid retention   . GERD (gastroesophageal reflux disease)   . Heart murmur   . Herpes simplex without mention of complication   . History of herpes simplex infection 12/18/2013  . Hyperlipidemia   . Hypertension   . Ovarian cyst   . Reflux   . Sciatica   . SOB (shortness of breath)     Past Surgical History:  Procedure Laterality Date  . DILATION AND CURETTAGE OF UTERUS    . ENDOMETRIAL ABLATION    . TUBAL LIGATION  2006    BP (!) 143/76   Pulse 89   Ht 5' (1.524 m)   Wt 258 lb (117 kg)   BMI 50.39 kg/m   She is awake alert and oriented x3 mood and affect are normal endomorphic body habitus she is on crutches she has pain and swelling of the left knee she has 40 degrees of passive motion she has a small effusion cruciate ligaments were stable collateral ligaments feel stable her pain is medial it appears to be medial epicondyle medial retinaculum she has some pain with patellar stress  Neurovascular exam is intact  The other knee seems to have a little bit of patellar excessive mobility but is otherwise normal  X-ray was taken at an outside facility as on the disc it was brought in 3 views of the knee I do not see any fracture or dislocation there is a joint effusion  Impression  Encounter  Diagnosis  Name Primary?  . Patellar subluxation, left, initial encounter Yes    Recommend ice knee exercises for range of motion and strengthening crutches out of work 3 weeks managed by can continue ibuprofen  Reexamine knee more thoroughly at next visit

## 2019-11-28 NOTE — Patient Instructions (Addendum)
Crutches weight bearing as tolerated   Knee exercises daily  Ice to control swelling 30 min at a time   OOW X 3 WEEKS    Patellar Dislocation and Subluxation The kneecap (patella) is located in a groove at the end of the thigh bone (femur). Patellar dislocation and patellar subluxation are injuries that happen when the patella slips out of its normal position.  In a patellar subluxation, the patella slips partly out of the groove.  In a patellar dislocation, the patella slips all the way out of the groove. What are the causes? This condition may be caused by:  A hit to the knee.  Twisting the knee when the foot is planted. What increases the risk? You are more likely to develop this condition if you:  Are an athlete in your teens or 96s.  Have had this condition before.  Play certain kinds of sports, including sports that: ? Include quick turns, quick changes in direction, or contact with other players, like soccer. ? Require jumping, such as basketball or volleyball. ? Require that cleats are worn. What are the signs or symptoms? Symptoms of this condition include:  A feeling that the knee is buckling, followed by sudden extreme pain in the knee.  A deformed knee.  A popping sensation, followed by a feeling that something is out of place.  Inability to bend or straighten the knee.  Swelling in the knee. How is this diagnosed? This condition may be diagnosed with:  A physical exam.  An X-ray. This may be done to see the position of the patella or to see if a bone has broken.  An MRI. This may be done to look at the alignment of your knee and the ligaments that hold your patella in place. How is this treated? Your patella may move back into place on its own when you straighten your knee. If your patella does not move back into place on its own, your health care provider will move it back into place. After your patella is back in its normal position, you may be  able to go back to your normal activities, or you may be treated further by:  Wearing a knee brace to keep your knee from moving (immobilized) while it heals.  Doing exercises that help improve strength and movement in your knee.  Taking medicine to help with pain and inflammation.  Applying ice to the knee to help with pain and inflammation.  Having surgery to prevent the patella from slipping out of place or to clean out any loose cartilage in your joint. This may be needed if other treatments do not help or if the condition keeps happening. Follow these instructions at home: If you have a brace:  Wear the brace as told by your health care provider. Remove it only as told by your health care provider.  Loosen the brace if your toes tingle, become numb, or turn cold and blue.  Keep the brace clean.  If the brace is not waterproof: ? Do not let it get wet. ? Cover it with a watertight covering when you take a bath or shower. Managing pain, stiffness, and swelling   If directed, put ice on the affected area. ? If you have a removable brace, remove it as told by your health care provider. ? Put ice in a plastic bag. ? Place a towel between your skin and the bag. ? Leave the ice on for 20 minutes, 2-3 times a day.  Move your toes often to reduce stiffness and swelling.  Raise (elevate) the injured area above the level of your heart while you are sitting or lying down. Activity  Do not use the injured limb to support your body weight until your health care provider says that you can. Use crutches as told by your health care provider.  Return to your normal activities as told by your health care provider. Ask your health care provider what activities are safe for you.  Do exercises as told by your health care provider. General instructions  Take over-the-counter and prescription medicines only as told by your health care provider.  Do not use any products that contain  nicotine or tobacco, such as cigarettes, e-cigarettes, and chewing tobacco. These can delay healing. If you need help quitting, ask your health care provider.  Keep all follow-up visits as told by your health care provider. This is important. How is this prevented?  Warm up and stretch before being active.  Cool down and stretch after being active.  Give your body time to rest between periods of activity.  Make sure to use equipment that fits you.  Be safe and responsible while being active. This will help you avoid falls.  Do at least 150 minutes of moderate-intensity exercise each week, such as brisk walking or water aerobics.  Maintain physical fitness, including: ? Strength. ? Flexibility. ? Cardiovascular fitness. ? Endurance. Contact a health care provider if:  The pain in your knee gets worse and is not relieved by medicine.  Your knee catches or locks. Get help right away if:  Your patella slips out of its normal position again.  The swelling in your knee gets worse. Summary  Patellar dislocation and patellar subluxation are injuries that happen when the patella slips out of its normal position.  If your patella does not move back into place on its own, your health care provider will move it back into place.  Return to your normal activities as told by your health care provider. Ask your health care provider what activities are safe for you.  Do not use the injured limb to support your body weight until your health care provider says that you can. Use crutches as told by your health care provider.  Keep all follow-up visits as told by your health care provider. This is important. This information is not intended to replace advice given to you by your health care provider. Make sure you discuss any questions you have with your health care provider. Document Revised: 11/28/2018 Document Reviewed: 06/26/2018 Elsevier Patient Education  [place].

## 2019-11-29 ENCOUNTER — Other Ambulatory Visit: Payer: Self-pay | Admitting: Adult Health

## 2019-12-14 ENCOUNTER — Telehealth: Payer: Self-pay | Admitting: Orthopedic Surgery

## 2019-12-14 NOTE — Telephone Encounter (Signed)
Called patient - left message - to relay information regarding forms ([employer]) which will require [employer] authorization forms/fee.

## 2019-12-18 NOTE — Telephone Encounter (Signed)
Reached patient; aware

## 2019-12-19 ENCOUNTER — Encounter: Payer: Self-pay | Admitting: Orthopedic Surgery

## 2019-12-19 ENCOUNTER — Ambulatory Visit (INDEPENDENT_AMBULATORY_CARE_PROVIDER_SITE_OTHER): Payer: BC Managed Care – PPO | Admitting: Orthopedic Surgery

## 2019-12-19 ENCOUNTER — Other Ambulatory Visit: Payer: Self-pay

## 2019-12-19 VITALS — BP 157/93 | HR 97 | Ht 60.0 in | Wt 258.0 lb

## 2019-12-19 DIAGNOSIS — Z6841 Body Mass Index (BMI) 40.0 and over, adult: Secondary | ICD-10-CM | POA: Diagnosis not present

## 2019-12-19 DIAGNOSIS — S83002D Unspecified subluxation of left patella, subsequent encounter: Secondary | ICD-10-CM

## 2019-12-19 DIAGNOSIS — S83002A Unspecified subluxation of left patella, initial encounter: Secondary | ICD-10-CM | POA: Insufficient documentation

## 2019-12-19 DIAGNOSIS — S83412D Sprain of medial collateral ligament of left knee, subsequent encounter: Secondary | ICD-10-CM | POA: Diagnosis not present

## 2019-12-19 NOTE — Progress Notes (Signed)
Chief Complaint  Patient presents with  . Knee Injury    11/22/19 left knee FOLLOW UP     The patient meets the AMA guidelines for Morbid (severe) obesity with a BMI > 40.0 and I have recommended weight loss.  BP (!) 157/93   Pulse 97   Ht 5' (1.524 m)   Wt 258 lb (117 kg)   BMI 50.39 kg/m   Encounter Diagnoses  Name Primary?  . Subluxation of left patella, subsequent encounter Yes  . Body mass index 50.0-59.9, adult ([place])   . Morbid obesity ([place])    C/O POPPING WHEN BENDING   49 year old female treated with knee exercises for range of motion and strengthening and crutches and out of work 3 weeks and ibuprofen presents back with improved condition less pain better range of motion no need for crutches at this time.  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  X-rays were taken they were normal she was placed on crutches given ibuprofen and Vicodin and told to see an orthopedist on her return home   She complains of swelling and pain left knee   She denies any skin lesions or rashes numbness or tingling in the left leg   Physical Exam Constitutional:      General: She is not in acute distress.    Appearance: She is well-developed. She is obese.  Cardiovascular:     Comments: No peripheral edema Musculoskeletal:       Legs:     Comments: At 0 degrees and 30 degrees she has some pain with valgus stress but good return to   Skin:    General: Skin is warm and dry.  Neurological:     Mental Status: She is alert and oriented to person, place, and time.     Sensory: No sensory deficit.     Coordination: Coordination normal.     Gait: Gait normal.     Deep Tendon Reflexes: Reflexes are normal and symmetric.  Psychiatric:        Mood and Affect: Mood normal.        Behavior: Behavior normal.     Encounter Diagnoses  Name Primary?  . Subluxation of left patella, subsequent encounter Yes  . Body mass index 50.0-59.9, adult ([place])   . Morbid obesity ([place])   . Sprain of medial collateral ligament of left knee, subsequent encounter     Recommend return to work May 10 patient is agreeable.  Handicap sticker form given.  Start physical therapy  Follow-up 6 weeks

## 2019-12-19 NOTE — Patient Instructions (Addendum)
Return to work date May 10  Start physical therapy  Follow-up 6 weeks

## 2019-12-26 ENCOUNTER — Encounter (HOSPITAL_COMMUNITY): Payer: Self-pay | Admitting: Physical Therapy

## 2019-12-26 ENCOUNTER — Ambulatory Visit (HOSPITAL_COMMUNITY): Payer: BC Managed Care – PPO | Attending: Orthopedic Surgery | Admitting: Physical Therapy

## 2019-12-26 ENCOUNTER — Other Ambulatory Visit: Payer: Self-pay

## 2019-12-26 DIAGNOSIS — R2689 Other abnormalities of gait and mobility: Secondary | ICD-10-CM | POA: Insufficient documentation

## 2019-12-26 DIAGNOSIS — M25562 Pain in left knee: Secondary | ICD-10-CM | POA: Insufficient documentation

## 2019-12-26 DIAGNOSIS — M25662 Stiffness of left knee, not elsewhere classified: Secondary | ICD-10-CM | POA: Diagnosis not present

## 2019-12-26 DIAGNOSIS — M6281 Muscle weakness (generalized): Secondary | ICD-10-CM

## 2019-12-26 NOTE — Therapy (Signed)
[facility] [address] [place], [place], [address] Phone: [phone]   Fax:  [phone]  Physical Therapy Evaluation  Patient Details  Name: Susan [name] MRN: [id number] Date of Birth: 1971-05-17 Referring Provider (PT): Arther Abbott   Encounter Date: 12/26/2019  PT End of Session - 12/26/19 1131    Visit Number  1    Number of Visits  12    Date for PT Re-Evaluation  02/06/20    Authorization Type  [id number]    Authorization - Visit Number  1    Authorization - Number of Visits  60    Progress Note Due on Visit  10    PT Start Time  [id number]    PT Stop Time  1129    PT Time Calculation (min)  42 min    Activity Tolerance  Patient tolerated treatment well    Behavior During Therapy  [facility] for tasks assessed/performed       Past Medical History:  Diagnosis Date  . Anxiety   . Diabetes mellitus ([place])   . Fluid retention   . GERD (gastroesophageal reflux disease)   . Heart murmur   . Herpes simplex without mention of complication   . History of herpes simplex infection 12/18/2013  . Hyperlipidemia   . Hypertension   . Ovarian cyst   . Reflux   . Sciatica   . SOB (shortness of breath)     Past Surgical History:  Procedure Laterality Date  . DILATION AND CURETTAGE OF UTERUS    . ENDOMETRIAL ABLATION    . TUBAL LIGATION  2006    There were no vitals filed for this visit.   Subjective Assessment - 12/26/19 0821    Subjective  Ms. Catino states that she was stepping over a rail on 11/22/2019 and landed on her Lt foot wrong.  She felt a pop in her Lt knee feeling as if her kneecap went out and then is went back in.  She is doing better than when this initially happened but is still having pain and limited motion therefore her MD referred her to PT    Limitations  Standing;Walking;House hold activities    How long can you sit comfortably?  15-20 minuts    How long can you stand comfortably?  15-20 minutes    How long can you  walk comfortably?  10 minutes    Patient Stated Goals  I want my knee to feel better    Currently in Pain?  Yes    Pain Score  4    worst:  7; Best:  4   Pain Location  Knee    Pain Orientation  Left    Pain Descriptors / Indicators  Throbbing    Pain Type  Acute pain    Pain Onset  More than a month ago    Pain Frequency  Constant    Aggravating Factors   activity    Pain Relieving Factors  ice, ibuprofen    Effect of Pain on Daily Activities  limits         [facility] PT Assessment - 12/26/19 0001      Assessment   Medical Diagnosis  Lt knee patella subluxation     Referring Provider (PT)  Arther Abbott    Onset Date/Surgical Date  11/22/19    Next MD Visit  not sure     Prior Therapy  none      Precautions   Precautions  None      Restrictions   Weight Bearing Restrictions  No      Balance Screen   Has the patient fallen in the past 6 months  No    Has the patient had a decrease in activity level because of a fear of falling?   Yes    Is the patient reluctant to leave their home because of a fear of falling?   No      Home Film/video editor residence    Home Access  Stairs to enter    Entrance Stairs-Number of Steps  24   holds both rails, one step at a time    Entrance Stairs-Rails  Can reach both    [place]  One level      Prior Function   Level of Independence  Independent    Vocation  Full time employment    Vocation Requirements  sitting     Leisure  walking       Cognition   Overall Cognitive Status  Within Functional Limits for tasks assessed      Observation/Other Assessments   Focus on Therapeutic Outcomes (FOTO)   49; 51 % limited       Functional Tests   Functional tests  Single leg stance;Sit to Stand      Single Leg Stance   Comments  Rt: 30"; LT 26"       Sit to Stand   Comments  9 in 30 seconds       ROM / Strength   AROM / PROM / Strength  AROM;Strength      AROM   AROM Assessment Site  Hip;Knee     Right/Left Knee  Left    Left Knee Extension  8    Left Knee Flexion  92      Strength   Strength Assessment Site  Hip;Knee    Right/Left Hip  Right;Left    Right Hip Flexion  5/5    Right Hip Extension  3+/5    Right Hip ABduction  5/5    Left Hip Flexion  5/5    Left Hip Extension  3+/5    Left Hip ABduction  5/5    Right/Left Knee  Right;Left    Right Knee Flexion  5/5    Right Knee Extension  5/5    Left Knee Flexion  4-/5    Left Knee Extension  3+/5      Ambulation/Gait   Ambulation Distance (Feet)  372 Feet    Gait Comments  3 minute walk test                   Objective measurements completed on examination: See above findings.      [facility] Adult PT Treatment/Exercise - 12/26/19 0001      Exercises   Exercises  Knee/Hip      Knee/Hip Exercises: Standing   SLS  x 3      Knee/Hip Exercises: Seated   Long Arc Quad  Left;10 reps    Sit to [employer]  10 reps      Knee/Hip Exercises: Supine   Quad Sets  10 reps    Heel Slides  Left;5 reps               PT Short Term Goals - 12/26/19 1221      PT SHORT TERM GOAL #1   Title  Pt to be I in HEP to improve  knee flexion to 110 to allow pt to be comfortable sittng for an hour.    Time  3    Period  Weeks    Status  New    [place] Date  01/16/20      PT SHORT TERM GOAL #2   Title  Pt Left knee pain to be no greater than a 5/10 to allow pt to walk for 20-30 minutes to be able to complete light shopping tasks.    Time  3    Period  Weeks    Status  New      PT SHORT TERM GOAL #3   Title  PT to be able to stand for 30 minutes at a time to be able to make a quick meal without discomfort.    Time  3    Period  Weeks    Status  New        PT Long Term Goals - 12/26/19 1223      PT LONG TERM GOAL #1   Title  Pt LE and core strength to be at least 4+/5 to allow pt to go up and down her apartment steps in a reciprocal manner with one hand hold onto the rails    Time  6    Period  Weeks     Status  New    [place] Date  02/06/20      PT LONG TERM GOAL #2   Title  PT ROM of Lt knee to be functional to allow pt to squat down and pick items off the floor when completing house cleaning    Time  6    Period  Weeks    Status  New      PT LONG TERM GOAL #3   Title  PT pain in her LT knee to be no greater than a 2/10 to allow pt to sleep without being disturbed by her knee    Time  6    Period  Weeks    Status  New             Plan - 12/26/19 1132    Clinical Impression Statement  Ms. Kolenda is a 49 yo female who was stepping over a rail, her left leg landed funny and she felt a popin her knee cap and then felt her kneecap relocate. She was on vacation so continued to ride go carts, the next day her knee was very swollen and she could not bear wt on it.  It is doing better now but she is still in pain and has limitations therefore her MD referred ther to therapy.    Examination-Activity Limitations  Bend;Squat;Carry;Locomotion Level;Lift    Examination-Participation Restrictions  Community Activity;Laundry;Yard Work;Cleaning    Stability/Clinical Decision Making  Stable/Uncomplicated    Clinical Decision Making  Low    Rehab Potential  Good    PT Frequency  2x / week   Do to work schedule pt is unable to come three times a week.   PT Duration  6 weeks    PT Treatment/Interventions  Gait training;Stair training;Functional mobility training;Therapeutic activities;Therapeutic exercise;Balance training;Patient/family education;Passive range of motion;Manual techniques    PT Next Visit Plan  begin heel raises, rockerboard, standing terminal extension, knee flexion and step ups.    PT Home Exercise Plan  sit to stand, single leg stance, LAQ, quad sets and heelslides       Patient will benefit from skilled therapeutic intervention in order to improve the  following deficits and impairments:  Abnormal gait, Decreased activity tolerance, Decreased balance, Decreased range of motion,  Difficulty walking, Decreased strength, Pain, Obesity  Visit Diagnosis: Acute pain of left knee - Plan: PT plan of care cert/re-cert  Stiffness of left knee, not elsewhere classified - Plan: PT plan of care cert/re-cert  Other abnormalities of gait and mobility - Plan: PT plan of care cert/re-cert  Muscle weakness (generalized) - Plan: PT plan of care cert/re-cert     Problem List Patient Active Problem List   Diagnosis Date Noted  . Patellar subluxation, left, initial encounter 12/19/2019  . Morbid obesity with body mass index (BMI) of 50.0 to 59.9 in adult ([place]) 05/29/2018  . OSA on CPAP 05/29/2018  . Class 3 obesity with alveolar hypoventilation, serious comorbidity, and body mass index (BMI) of 50.0 to 59.9 in adult ([place]) 01/12/2018  . Sleep related headaches 01/12/2018  . Intractable chronic cluster headache 01/12/2018  . Excessive daytime sleepiness 01/12/2018  . Sleeps in sitting position due to orthopnea 01/12/2018  . Other fatigue 11/15/2017  . Shortness of breath on exertion 11/15/2017  . Type 2 diabetes mellitus without complication, without long-term current use of insulin ([place]) 11/15/2017  . Essential hypertension 11/15/2017  . Other hyperlipidemia 11/15/2017  . Allergic rhinitis 10/12/2017  . DOE (dyspnea on exertion)   . Chest pain 08/10/2017  . Type 2 diabetes mellitus with hyperlipidemia ([place]) 08/10/2017  . Personal history of dysmenorrhea 03/10/2017  . Chronic hypertension 02/21/2017  . Dysmenorrhea 01/27/2017  . Tendinitis of left rotator cuff 01/27/2015  . History of herpes simplex infection 12/18/2013  . Flank pain 09/21/2012  . GERD (gastroesophageal reflux disease) 09/21/2012  . Herpes genitalia 09/21/2012    Rayetta Humphrey, PT CLT [phone] 12/26/2019, 12:31 PM  [facility] [address] [place], [place], [address] Phone: [phone]   Fax:  [phone]  Name: ADDISYNN TREVILLION MRN:  [id number] Date of Birth: [birth year]

## 2020-01-01 ENCOUNTER — Other Ambulatory Visit: Payer: Self-pay

## 2020-01-01 ENCOUNTER — Ambulatory Visit (HOSPITAL_COMMUNITY): Payer: BC Managed Care – PPO | Admitting: Physical Therapy

## 2020-01-01 DIAGNOSIS — M25562 Pain in left knee: Secondary | ICD-10-CM

## 2020-01-01 DIAGNOSIS — M25662 Stiffness of left knee, not elsewhere classified: Secondary | ICD-10-CM

## 2020-01-01 DIAGNOSIS — M6281 Muscle weakness (generalized): Secondary | ICD-10-CM | POA: Diagnosis not present

## 2020-01-01 DIAGNOSIS — R2689 Other abnormalities of gait and mobility: Secondary | ICD-10-CM | POA: Diagnosis not present

## 2020-01-01 NOTE — Therapy (Signed)
[facility] [address] [place], [place], [address] Phone: [phone]   Fax:  [phone]  Physical Therapy Treatment  Patient Details  Name: Susan [name] MRN: [id number] Date of Birth: 1971/01/01 Referring Provider (PT): Arther Abbott   Encounter Date: 01/01/2020  PT End of Session - 01/01/20 0907    Visit Number  2    Number of Visits  12    Date for PT Re-Evaluation  02/06/20    Authorization Type  [id number]    Authorization - Visit Number  2    Authorization - Number of Visits  60    Progress Note Due on Visit  10    PT Start Time  0835    PT Stop Time  0913    PT Time Calculation (min)  38 min    Activity Tolerance  Patient tolerated treatment well    Behavior During Therapy  [facility] for tasks assessed/performed       Past Medical History:  Diagnosis Date  . Anxiety   . Diabetes mellitus ([place])   . Fluid retention   . GERD (gastroesophageal reflux disease)   . Heart murmur   . Herpes simplex without mention of complication   . History of herpes simplex infection 12/18/2013  . Hyperlipidemia   . Hypertension   . Ovarian cyst   . Reflux   . Sciatica   . SOB (shortness of breath)     Past Surgical History:  Procedure Laterality Date  . DILATION AND CURETTAGE OF UTERUS    . ENDOMETRIAL ABLATION    . TUBAL LIGATION  2006    There were no vitals filed for this visit.  Subjective Assessment - 01/01/20 0837    Subjective  Pt states her leg is just a little "wobbly".  STates it has not subluxed since original injury.  Currently 3/10.  Reports compliance with HEP.    Currently in Pain?  Yes    Pain Score  3     Pain Location  Knee    Pain Orientation  Left    Pain Descriptors / Indicators  Throbbing                        [facility] Adult PT Treatment/Exercise - 01/01/20 0001      Knee/Hip Exercises: Standing   Heel Raises  15 reps    Knee Flexion  Left;15 reps    Terminal Knee Extension  Left;10  reps    Terminal Knee Extension Limitations  Lt with ball against wall 5" holds    Lateral Step Up  Left;15 reps;Hand Hold: 1;Step Height: 4"    Forward Step Up  15 reps;Hand Hold: 1;Left;Step Height: 4"    Step Down  Left;10 reps;Hand Hold: 1;Step Height: 4"    Wall Squat  10 reps    Wall Squat Limitations  5 second holds in good form with ball between knees    Rocker Board  Other (comment)    Rocker Board Limitations  A/P and Rt/Lt 10 reps each    SLS  x 3 Lt LE with best time of  17"    SLS with Vectors  Lt 5X5" with 1 HHA      Knee/Hip Exercises: Seated   Long Arc Quad  Left;10 reps    Long Arc Quad Limitations  HEP review    Sit to [employer]  10 reps;without UE support      Knee/Hip Exercises: Supine  Quad Sets  10 reps    [employer] Limitations  HEP review    Short Arc [employer]  Left;10 reps;Limitations    Short Arc [employer] Limitations  5" holds neurtral and in ER    Heel Slides  Left;5 reps    Heel Slides Limitations  HEP review               PT Short Term Goals - 01/01/20 0908      PT SHORT TERM GOAL #1   Title  Pt to be I in HEP to improve knee flexion to 110 to allow pt to be comfortable sittng for an hour.    Time  3    Period  Weeks    Status  On-going    Target Date  01/16/20      PT SHORT TERM GOAL #2   Title  Pt Left knee pain to be no greater than a 5/10 to allow pt to walk for 20-30 minutes to be able to complete light shopping tasks.    Time  3    Period  Weeks    Status  On-going      PT SHORT TERM GOAL #3   Title  PT to be able to stand for 30 minutes at a time to be able to make a quick meal without discomfort.    Time  3    Period  Weeks    Status  On-going        PT Long Term Goals - 01/01/20 0909      PT LONG TERM GOAL #1   Title  Pt LE and core strength to be at least 4+/5 to allow pt to go up and down her apartment steps in a reciprocal manner with one hand hold onto the rails    Time  6    Period  Weeks    Status  On-going       PT LONG TERM GOAL #2   Title  PT ROM of Lt knee to be functional to allow pt to squat down and pick items off the floor when completing house cleaning    Time  6    Period  Weeks    Status  On-going      PT LONG TERM GOAL #3   Title  PT pain in her LT knee to be no greater than a 2/10 to allow pt to sleep without being disturbed by her knee    Time  6    Period  Weeks    Status  On-going            Plan - 01/01/20 0903    Clinical Impression Statement  Pt returns today with minimal pain in Lt knee, however pops with return from open chain knee extension and heelslides.  Added additional therex today per POC to increase strength and stability of Lt knee.  Pt able to complete all exercises without pain or dysfunction.  Goals and POC reveiwed with pateint. No additional therex added to HEP this session.    Examination-Activity Limitations  Bend;Squat;Carry;Locomotion Level;Lift    Examination-Participation Restrictions  Community Activity;Laundry;Yard Work;Cleaning    Stability/Clinical Decision Making  Stable/Uncomplicated    Rehab Potential  Good    PT Frequency  2x / week   Do to work schedule pt is unable to come three times a week.   PT Duration  6 weeks    PT Treatment/Interventions  Gait training;Stair training;Functional mobility training;Therapeutic activities;Therapeutic  exercise;Balance training;Patient/family education;Passive range of motion;Manual techniques    PT Next Visit Plan  Continue to progress Lt LE strength and stability.    PT Home Exercise Plan  sit to stand, single leg stance, LAQ, quad sets and heelslides       Patient will benefit from skilled therapeutic intervention in order to improve the following deficits and impairments:  Abnormal gait, Decreased activity tolerance, Decreased balance, Decreased range of motion, Difficulty walking, Decreased strength, Pain, Obesity  Visit Diagnosis: Acute pain of left knee  Stiffness of left knee, not elsewhere  classified  Other abnormalities of gait and mobility     Problem List Patient Active Problem List   Diagnosis Date Noted  . Patellar subluxation, left, initial encounter 12/19/2019  . Morbid obesity with body mass index (BMI) of 50.0 to 59.9 in adult ([place]) 05/29/2018  . OSA on CPAP 05/29/2018  . Class 3 obesity with alveolar hypoventilation, serious comorbidity, and body mass index (BMI) of 50.0 to 59.9 in adult ([place]) 01/12/2018  . Sleep related headaches 01/12/2018  . Intractable chronic cluster headache 01/12/2018  . Excessive daytime sleepiness 01/12/2018  . Sleeps in sitting position due to orthopnea 01/12/2018  . Other fatigue 11/15/2017  . Shortness of breath on exertion 11/15/2017  . Type 2 diabetes mellitus without complication, without long-term current use of insulin ([place]) 11/15/2017  . Essential hypertension 11/15/2017  . Other hyperlipidemia 11/15/2017  . Allergic rhinitis 10/12/2017  . DOE (dyspnea on exertion)   . Chest pain 08/10/2017  . Type 2 diabetes mellitus with hyperlipidemia ([place]) 08/10/2017  . Personal history of dysmenorrhea 03/10/2017  . Chronic hypertension 02/21/2017  . Dysmenorrhea 01/27/2017  . Tendinitis of left rotator cuff 01/27/2015  . History of herpes simplex infection 12/18/2013  . Flank pain 09/21/2012  . GERD (gastroesophageal reflux disease) 09/21/2012  . Herpes genitalia 09/21/2012   Teena Irani, PTA/CLT [phone]  Teena Irani 01/01/2020, 9:10 AM  [place], [place], [address] Phone: [phone]   Fax:  [phone]  Name: Susan [name] MRN: [id number] Date of Birth: [birth year]

## 2020-01-02 ENCOUNTER — Encounter (HOSPITAL_COMMUNITY): Payer: Self-pay | Admitting: Physical Therapy

## 2020-01-02 ENCOUNTER — Ambulatory Visit (HOSPITAL_COMMUNITY): Payer: BC Managed Care – PPO | Admitting: Physical Therapy

## 2020-01-02 DIAGNOSIS — M25662 Stiffness of left knee, not elsewhere classified: Secondary | ICD-10-CM

## 2020-01-02 DIAGNOSIS — R2689 Other abnormalities of gait and mobility: Secondary | ICD-10-CM

## 2020-01-02 DIAGNOSIS — M6281 Muscle weakness (generalized): Secondary | ICD-10-CM

## 2020-01-02 DIAGNOSIS — M25562 Pain in left knee: Secondary | ICD-10-CM

## 2020-01-02 NOTE — Therapy (Signed)
[facility] [address] [place], [place], [address] Phone: [phone]   Fax:  [phone]  Physical Therapy Treatment  Patient Details  Name: Susan [name] MRN: [id number] Date of Birth: 1971-07-01 Referring Provider (PT): Arther Abbott   Encounter Date: 01/02/2020  PT End of Session - 01/02/20 1126    Visit Number  3    Number of Visits  12    Date for PT Re-Evaluation  02/06/20    Authorization Type  BCBSPPO    Authorization - Visit Number  3    Authorization - Number of Visits  60    Progress Note Due on Visit  10    PT Start Time  1122    PT Stop Time  1200    PT Time Calculation (min)  38 min    Activity Tolerance  Patient tolerated treatment well    Behavior During Therapy  [facility] for tasks assessed/performed       Past Medical History:  Diagnosis Date  . Anxiety   . Diabetes mellitus ([place])   . Fluid retention   . GERD (gastroesophageal reflux disease)   . Heart murmur   . Herpes simplex without mention of complication   . History of herpes simplex infection 12/18/2013  . Hyperlipidemia   . Hypertension   . Ovarian cyst   . Reflux   . Sciatica   . SOB (shortness of breath)     Past Surgical History:  Procedure Laterality Date  . DILATION AND CURETTAGE OF UTERUS    . ENDOMETRIAL ABLATION    . TUBAL LIGATION  2006    There were no vitals filed for this visit.  Subjective Assessment - 01/02/20 1125    Subjective  Patient reports soreness, but no pain currently.    Currently in Pain?  No/denies                        [facility] Adult PT Treatment/Exercise - 01/02/20 0001      Knee/Hip Exercises: Standing   Heel Raises  15 reps    Knee Flexion  Left;15 reps    Terminal Knee Extension  Left;10 reps    Terminal Knee Extension Limitations  Lt with ball against wall 5" holds    Lateral Step Up  Left;15 reps;Hand Hold: 1;Step Height: 6"    Forward Step Up  15 reps;Hand Hold: 1;Left;Step Height:  6"    Step Down  Left;10 reps;Hand Hold: 1;Step Height: 4"    SLS with Vectors  Lt 5X5" with 1 HHA      Knee/Hip Exercises: Seated   Long Arc Quad  Left;10 reps    Sit to [employer]  10 reps;without UE support      Knee/Hip Exercises: Supine   Quad Sets  10 reps    Straight Leg Raises  Strengthening;Left;1 set;15 reps    Straight Leg Raise with External Rotation  Strengthening;Left;1 set;15 reps      Manual Therapy   Manual Therapy  Soft tissue mobilization    Manual therapy comments  Performed seperate from other interventions    Soft tissue mobilization  STM to patient's medial hamstring and quadriceps muscle for pain relief and relaxation             PT Education - 01/02/20 1415    Education Details  Discussed purpose and technique of interventions throughout session.    Person(s) Educated  Patient    Methods  Explanation  Comprehension  Verbalized understanding       PT Short Term Goals - 01/01/20 0908      PT SHORT TERM GOAL #1   Title  Pt to be I in HEP to improve knee flexion to 110 to allow pt to be comfortable sittng for an hour.    Time  3    Period  Weeks    Status  On-going    [place] Date  01/16/20      PT SHORT TERM GOAL #2   Title  Pt Left knee pain to be no greater than a 5/10 to allow pt to walk for 20-30 minutes to be able to complete light shopping tasks.    Time  3    Period  Weeks    Status  On-going      PT SHORT TERM GOAL #3   Title  PT to be able to stand for 30 minutes at a time to be able to make a quick meal without discomfort.    Time  3    Period  Weeks    Status  On-going        PT Long Term Goals - 01/01/20 0909      PT LONG TERM GOAL #1   Title  Pt LE and core strength to be at least 4+/5 to allow pt to go up and down her apartment steps in a reciprocal manner with one hand hold onto the rails    Time  6    Period  Weeks    Status  On-going      PT LONG TERM GOAL #2   Title  PT ROM of Lt knee to be functional to allow pt to  squat down and pick items off the floor when completing house cleaning    Time  6    Period  Weeks    Status  On-going      PT LONG TERM GOAL #3   Title  PT pain in her LT knee to be no greater than a 2/10 to allow pt to sleep without being disturbed by her knee    Time  6    Period  Weeks    Status  On-going            Plan - 01/02/20 1415    Clinical Impression Statement  This session progressed patient's left lower extremity strengthening. Added straight leg raises in neutral alignment and with the hip externally rotated. Patient demonstrated good form with this without quad lag. Ended session with STM this session to help relax the muscles on the medial side of the left knee, with patient reporting feeling good at the end of the session.    Examination-Activity Limitations  Bend;Squat;Carry;Locomotion Level;Lift    Examination-Participation Restrictions  Community Activity;Laundry;Yard Work;Cleaning    Stability/Clinical Decision Making  Stable/Uncomplicated    Rehab Potential  Good    PT Frequency  2x / week   Do to work schedule pt is unable to come three times a week.   PT Duration  6 weeks    PT Treatment/Interventions  Gait training;Stair training;Functional mobility training;Therapeutic activities;Therapeutic exercise;Balance training;Patient/family education;Passive range of motion;Manual techniques    PT Next Visit Plan  Continue to progress Lt LE strength and stability. Continue manual therapy PRN.    PT Home Exercise Plan  sit to stand, single leg stance, LAQ, quad sets and heelslides       Patient will benefit from skilled therapeutic intervention in order  to improve the following deficits and impairments:  Abnormal gait, Decreased activity tolerance, Decreased balance, Decreased range of motion, Difficulty walking, Decreased strength, Pain, Obesity  Visit Diagnosis: Acute pain of left knee  Stiffness of left knee, not elsewhere classified  Other abnormalities  of gait and mobility  Muscle weakness (generalized)     Problem List Patient Active Problem List   Diagnosis Date Noted  . Patellar subluxation, left, initial encounter 12/19/2019  . Morbid obesity with body mass index (BMI) of 50.0 to 59.9 in adult ([place]) 05/29/2018  . OSA on CPAP 05/29/2018  . Class 3 obesity with alveolar hypoventilation, serious comorbidity, and body mass index (BMI) of 50.0 to 59.9 in adult ([place]) 01/12/2018  . Sleep related headaches 01/12/2018  . Intractable chronic cluster headache 01/12/2018  . Excessive daytime sleepiness 01/12/2018  . Sleeps in sitting position due to orthopnea 01/12/2018  . Other fatigue 11/15/2017  . Shortness of breath on exertion 11/15/2017  . Type 2 diabetes mellitus without complication, without long-term current use of insulin ([place]) 11/15/2017  . Essential hypertension 11/15/2017  . Other hyperlipidemia 11/15/2017  . Allergic rhinitis 10/12/2017  . DOE (dyspnea on exertion)   . Chest pain 08/10/2017  . Type 2 diabetes mellitus with hyperlipidemia ([place]) 08/10/2017  . Personal history of dysmenorrhea 03/10/2017  . Chronic hypertension 02/21/2017  . Dysmenorrhea 01/27/2017  . Tendinitis of left rotator cuff 01/27/2015  . History of herpes simplex infection 12/18/2013  . Flank pain 09/21/2012  . GERD (gastroesophageal reflux disease) 09/21/2012  . Herpes genitalia 09/21/2012   Clarene Critchley PT, DPT 2:19 PM, 01/02/20 [place], [place], [address] Phone: [phone]   Fax:  [phone]  Name: Susan [name] MRN: [id number] Date of Birth: 1970/09/25

## 2020-01-10 ENCOUNTER — Ambulatory Visit (HOSPITAL_COMMUNITY): Payer: BC Managed Care – PPO | Admitting: Physical Therapy

## 2020-01-10 ENCOUNTER — Encounter (HOSPITAL_COMMUNITY): Payer: Self-pay | Admitting: Physical Therapy

## 2020-01-10 ENCOUNTER — Other Ambulatory Visit: Payer: Self-pay

## 2020-01-10 DIAGNOSIS — M6281 Muscle weakness (generalized): Secondary | ICD-10-CM | POA: Diagnosis not present

## 2020-01-10 DIAGNOSIS — M25662 Stiffness of left knee, not elsewhere classified: Secondary | ICD-10-CM

## 2020-01-10 DIAGNOSIS — M25562 Pain in left knee: Secondary | ICD-10-CM

## 2020-01-10 DIAGNOSIS — R2689 Other abnormalities of gait and mobility: Secondary | ICD-10-CM | POA: Diagnosis not present

## 2020-01-10 NOTE — Patient Instructions (Signed)
Step-Up: Forward    Leading with right leg, bring both feet onto _6___ inch step. Return to starting position, leading with left leg. Repeat __10__ times per session. Do __2__ sessions per day. Repeat in dimly lit room. Repeat with eyes closed.  Copyright  VHI. All rights reserved.  FUNCTIONAL MOBILITY: Squat    Stance: shoulder-width on floor. Bend hips and knees. Keep back straight. Do not allow knees to bend past toes. Squeeze glutes and quads to stand. 15___ reps per set, __1_ sets per day, __2_ days per week  Copyright  VHI. All rights reserved.  Heel Raise: Bilateral (Standing)    Rise on balls of feet. Repeat 15____ times per set. Do _1___ sets per session. Do __2__ sessions per day.  http://orth.exer.us/39   Copyright  VHI. All rights reserved.

## 2020-01-10 NOTE — Therapy (Signed)
[facility] [address] [place], [place], [address] Phone: [phone]   Fax:  [phone]  Physical Therapy Treatment  Patient Details  Name: Susan [name] MRN: [id number] Date of Birth: 06-20-1971 Referring Provider (PT): Arther Abbott   Encounter Date: 01/10/2020  PT End of Session - 01/10/20 0912    Visit Number  4    Number of Visits  12    Date for PT Re-Evaluation  02/06/20    Authorization Type  BCBSPPO    Authorization - Visit Number  4    Authorization - Number of Visits  60    Progress Note Due on Visit  10    PT Start Time  [id number]    PT Stop Time  0925    PT Time Calculation (min)  38 min    Activity Tolerance  Patient tolerated treatment well    Behavior During Therapy  [facility] for tasks assessed/performed       Past Medical History:  Diagnosis Date  . Anxiety   . Diabetes mellitus ([place])   . Fluid retention   . GERD (gastroesophageal reflux disease)   . Heart murmur   . Herpes simplex without mention of complication   . History of herpes simplex infection 12/18/2013  . Hyperlipidemia   . Hypertension   . Ovarian cyst   . Reflux   . Sciatica   . SOB (shortness of breath)     Past Surgical History:  Procedure Laterality Date  . DILATION AND CURETTAGE OF UTERUS    . ENDOMETRIAL ABLATION    . TUBAL LIGATION  2006    There were no vitals filed for this visit.  Subjective Assessment - 01/10/20 0848    Subjective  PT states that her knee is doing a lot better.    Limitations  Standing;Walking;House hold activities    How long can you sit comfortably?  able to sit for 30 minutes now was 15-20 minuts    How long can you stand comfortably?  30 minutes was 15-20 minutes    How long can you walk comfortably?  25 minutes 10 minutes    Patient Stated Goals  I want my knee to feel better    Currently in Pain?  No/denies    Pain Onset  More than a month ago               [facility] Adult PT Treatment/Exercise  - 01/10/20 0001      Exercises   Exercises  Knee/Hip      Knee/Hip Exercises: Stretches   Passive Hamstring Stretch  Both;3 reps;30 seconds      Knee/Hip Exercises: Standing   Heel Raises  15 reps   no hands    Knee Flexion  Left;15 reps    Knee Flexion Limitations  3#    Terminal Knee Extension  Left;15 reps    Terminal Knee Extension Limitations  --    Lateral Step Up  Left;15 reps;Hand Hold: 1;Step Height: 6"    Forward Step Up  Left;15 reps;Step Height: 4"   6" was to painful    Step Down  --    Wall Squat  10 reps    Wall Squat Limitations  5 second holds in good form with ball between knees    Rocker Board  2 minutes    SLS with Vectors  Lt 5X10"       Knee/Hip Exercises: Seated   Long Arc Quad  Left;10 reps  Long Arc Quad Weight  3 lbs.    Sit to [employer]  15 reps;without UE support      Knee/Hip Exercises: Supine   Quad Sets  --    Straight Leg Raises  Strengthening;Left;1 set;15 reps    Straight Leg Raise with External Rotation  Strengthening;Left;1 set;15 reps      Manual Therapy   Manual Therapy  Soft tissue mobilization    Manual therapy comments  Performed seperate from other interventions    Soft tissue mobilization  STM to patient's medial hamstring and quadriceps muscle for pain relief and relaxation             PT Education - 01/10/20 0924    Education Details  updated HEP    Person(s) Educated  Patient    Methods  Explanation    Comprehension  Verbalized understanding;Returned demonstration       PT Short Term Goals - 01/01/20 0908      PT SHORT TERM GOAL #1   Title  Pt to be I in HEP to improve knee flexion to 110 to allow pt to be comfortable sittng for an hour.    Time  3    Period  Weeks    Status  On-going    [employer] Date  01/16/20      PT SHORT TERM GOAL #2   Title  Pt Left knee pain to be no greater than a 5/10 to allow pt to walk for 20-30 minutes to be able to complete light shopping tasks.    Time  3    Period  Weeks     Status  On-going      PT SHORT TERM GOAL #3   Title  PT to be able to stand for 30 minutes at a time to be able to make a quick meal without discomfort.    Time  3    Period  Weeks    Status  On-going        PT Long Term Goals - 01/01/20 0909      PT LONG TERM GOAL #1   Title  Pt LE and core strength to be at least 4+/5 to allow pt to go up and down her apartment steps in a reciprocal manner with one hand hold onto the rails    Time  6    Period  Weeks    Status  On-going      PT LONG TERM GOAL #2   Title  PT ROM of Lt knee to be functional to allow pt to squat down and pick items off the floor when completing house cleaning    Time  6    Period  Weeks    Status  On-going      PT LONG TERM GOAL #3   Title  PT pain in her LT knee to be no greater than a 2/10 to allow pt to sleep without being disturbed by her knee    Time  6    Period  Weeks    Status  On-going            Plan - 01/10/20 0915    Clinical Impression Statement  Pt progressing well with ROM and strength.  Completed activity without arm holds to challenge balance, added wt to exercises as well as time to vector stances.    Examination-Activity Limitations  Bend;Squat;Carry;Locomotion Level;Lift    Examination-Participation Restrictions  Community Activity;Laundry;Yard Work;Cleaning    Stability/Clinical Decision Making  Stable/Uncomplicated  Clinical Decision Making  Low    Rehab Potential  Good    PT Frequency  2x / week    PT Duration  6 weeks    PT Treatment/Interventions  Gait training;Stair training;Functional mobility training;Therapeutic activities;Therapeutic exercise;Balance training;Patient/family education;Passive range of motion;Manual techniques    PT Next Visit Plan  Continue to progress Lt LE strength and stability. Continue manual therapy PRN.    PT Home Exercise Plan  sit to stand, single leg stance, LAQ, quad sets and heelslides; 5/27:  heel raises, functional squat , step ups        Patient will benefit from skilled therapeutic intervention in order to improve the following deficits and impairments:  Abnormal gait, Decreased activity tolerance, Decreased balance, Decreased range of motion, Difficulty walking, Decreased strength, Pain, Obesity  Visit Diagnosis: Stiffness of left knee, not elsewhere classified  Acute pain of left knee  Other abnormalities of gait and mobility  Muscle weakness (generalized)     Problem List Patient Active Problem List   Diagnosis Date Noted  . Patellar subluxation, left, initial encounter 12/19/2019  . Morbid obesity with body mass index (BMI) of 50.0 to 59.9 in adult ([place]) 05/29/2018  . OSA on CPAP 05/29/2018  . Class 3 obesity with alveolar hypoventilation, serious comorbidity, and body mass index (BMI) of 50.0 to 59.9 in adult ([place]) 01/12/2018  . Sleep related headaches 01/12/2018  . Intractable chronic cluster headache 01/12/2018  . Excessive daytime sleepiness 01/12/2018  . Sleeps in sitting position due to orthopnea 01/12/2018  . Other fatigue 11/15/2017  . Shortness of breath on exertion 11/15/2017  . Type 2 diabetes mellitus without complication, without long-term current use of insulin ([place]) 11/15/2017  . Essential hypertension 11/15/2017  . Other hyperlipidemia 11/15/2017  . Allergic rhinitis 10/12/2017  . DOE (dyspnea on exertion)   . Chest pain 08/10/2017  . Type 2 diabetes mellitus with hyperlipidemia ([place]) 08/10/2017  . Personal history of dysmenorrhea 03/10/2017  . Chronic hypertension 02/21/2017  . Dysmenorrhea 01/27/2017  . Tendinitis of left rotator cuff 01/27/2015  . History of herpes simplex infection 12/18/2013  . Flank pain 09/21/2012  . GERD (gastroesophageal reflux disease) 09/21/2012  . Herpes genitalia 09/21/2012    Rayetta Humphrey, PT CLT [phone] 01/10/2020, 9:25 AM  [facility] [address] [place], [place], [address] Phone:  [phone]   Fax:  [phone]  Name: Susan [name] MRN: [id number] Date of Birth: [birth year]

## 2020-01-11 ENCOUNTER — Telehealth (HOSPITAL_COMMUNITY): Payer: Self-pay | Admitting: Physical Therapy

## 2020-01-11 ENCOUNTER — Ambulatory Visit (HOSPITAL_COMMUNITY): Payer: BC Managed Care – PPO | Admitting: Physical Therapy

## 2020-01-11 NOTE — Telephone Encounter (Signed)
pt cancelled appt for today because she woke up with an upset stomach

## 2020-01-17 ENCOUNTER — Other Ambulatory Visit: Payer: Self-pay

## 2020-01-17 ENCOUNTER — Ambulatory Visit (HOSPITAL_COMMUNITY): Payer: BC Managed Care – PPO | Attending: Orthopedic Surgery | Admitting: Physical Therapy

## 2020-01-17 DIAGNOSIS — R2689 Other abnormalities of gait and mobility: Secondary | ICD-10-CM | POA: Diagnosis not present

## 2020-01-17 DIAGNOSIS — M25562 Pain in left knee: Secondary | ICD-10-CM

## 2020-01-17 DIAGNOSIS — M6281 Muscle weakness (generalized): Secondary | ICD-10-CM

## 2020-01-17 DIAGNOSIS — M25662 Stiffness of left knee, not elsewhere classified: Secondary | ICD-10-CM | POA: Diagnosis not present

## 2020-01-17 NOTE — Therapy (Signed)
[facility] [address] [place], [place], [address] Phone: [phone]   Fax:  [phone]  Physical Therapy Treatment  Patient Details  Name: Susan [name] MRN: [id number] Date of Birth: 07/06/1971 Referring Provider (PT): Arther Abbott   Encounter Date: 01/17/2020  PT End of Session - 01/17/20 0843    Visit Number  5    Number of Visits  12    Date for PT Re-Evaluation  02/06/20    Authorization Type  [id number]    Authorization - Visit Number  5    Authorization - Number of Visits  60    Progress Note Due on Visit  10    PT Start Time  0833    PT Stop Time  0915    PT Time Calculation (min)  42 min    Activity Tolerance  Patient tolerated treatment well    Behavior During Therapy  [facility] for tasks assessed/performed       Past Medical History:  Diagnosis Date  . Anxiety   . Diabetes mellitus ([place])   . Fluid retention   . GERD (gastroesophageal reflux disease)   . Heart murmur   . Herpes simplex without mention of complication   . History of herpes simplex infection 12/18/2013  . Hyperlipidemia   . Hypertension   . Ovarian cyst   . Reflux   . Sciatica   . SOB (shortness of breath)     Past Surgical History:  Procedure Laterality Date  . DILATION AND CURETTAGE OF UTERUS    . ENDOMETRIAL ABLATION    . TUBAL LIGATION  2006    There were no vitals filed for this visit.  Subjective Assessment - 01/17/20 0834    Subjective  Pt states that she did a lot of walkng this week her pain is up.    Limitations  Standing;Walking;House hold activities    How long can you sit comfortably?  able to sit for 30 minutes now was 15-20 minuts    How long can you stand comfortably?  30 minutes was 15-20 minutes    How long can you walk comfortably?  25 minutes 10 minutes    Patient Stated Goals  I want my knee to feel better    Currently in Pain?  Yes    Pain Score  5     Pain Location  Leg    Pain Orientation  Left    Pain  Descriptors / Indicators  Aching    Pain Type  Acute pain    Pain Onset  More than a month ago    Pain Frequency  Constant    Aggravating Factors   activity    Pain Relieving Factors  ice , ibuprofen    Effect of Pain on Daily Activities  limits                        [facility] Adult PT Treatment/Exercise - 01/17/20 0001      Exercises   Exercises  Knee/Hip      Knee/Hip Exercises: Stretches   Gastroc Stretch Limitations  slant board 3 x 30"      Knee/Hip Exercises: Machines for Strengthening   Total Gym Leg Press  4PL x 15       Knee/Hip Exercises: Standing   Heel Raises  15 reps    Knee Flexion  Left;15 reps    Knee Flexion Limitations  4#     Forward Lunges  Both;10 reps;Limitations    Forward Lunges Limitations  onto 4" step     Lateral Step Up  Left;15 reps;Hand Hold: 1;Step Height: 6"    Forward Step Up  Left;10 reps;Step Height: 6"   6" was to painful    Functional Squat  10 reps    Wall Squat  10 reps    Wall Squat Limitations  5 second holds with greenball    Rocker Board  2 minutes    SLS with Vectors  Lt 5X10"     Other Standing Knee Exercises  side step with tband x 2       Knee/Hip Exercises: Seated   Long Arc Quad  Left;10 reps    Long Arc Quad Weight  4 lbs.    Sit to [employer]  15 reps   raising green swiss ball into the air             PT Education - 01/17/20 0913    Education Details  HEP    Person(s) Educated  Patient    Methods  Explanation;Handout;Demonstration    Comprehension  Verbalized understanding;Returned demonstration       PT Short Term Goals - 01/01/20 0908      PT SHORT TERM GOAL #1   Title  Pt to be I in HEP to improve knee flexion to 110 to allow pt to be comfortable sittng for an hour.    Time  3    Period  Weeks    Status  On-going    Target Date  01/16/20      PT SHORT TERM GOAL #2   Title  Pt Left knee pain to be no greater than a 5/10 to allow pt to walk for 20-30 minutes to be able to complete light  shopping tasks.    Time  3    Period  Weeks    Status  On-going      PT SHORT TERM GOAL #3   Title  PT to be able to stand for 30 minutes at a time to be able to make a quick meal without discomfort.    Time  3    Period  Weeks    Status  On-going        PT Long Term Goals - 01/01/20 0909      PT LONG TERM GOAL #1   Title  Pt LE and core strength to be at least 4+/5 to allow pt to go up and down her apartment steps in a reciprocal manner with one hand hold onto the rails    Time  6    Period  Weeks    Status  On-going      PT LONG TERM GOAL #2   Title  PT ROM of Lt knee to be functional to allow pt to squat down and pick items off the floor when completing house cleaning    Time  6    Period  Weeks    Status  On-going      PT LONG TERM GOAL #3   Title  PT pain in her LT knee to be no greater than a 2/10 to allow pt to sleep without being disturbed by her knee    Time  6    Period  Weeks    Status  On-going            Plan - 01/17/20 0914    Clinical Impression Statement  PT continues to progress with strengthening and  balance.  Added leg press slant board stretch, increased wt  and lunges today with minimal difficuolty.    Examination-Activity Limitations  Bend;Squat;Carry;Locomotion Level;Lift    Examination-Participation Restrictions  Community Activity;Laundry;Yard Work;Cleaning    Stability/Clinical Decision Making  Stable/Uncomplicated    Rehab Potential  Good    PT Frequency  2x / week    PT Duration  6 weeks    PT Treatment/Interventions  Gait training;Stair training;Functional mobility training;Therapeutic activities;Therapeutic exercise;Balance training;Patient/family education;Passive range of motion;Manual techniques    PT Next Visit Plan  Continue to progress Lt LE strength and stability. Add Warrior poses.    PT Home Exercise Plan  sit to stand, single leg stance, LAQ, quad sets and heelslides; 5/27:  heel raises, functional squat , step ups        Patient will benefit from skilled therapeutic intervention in order to improve the following deficits and impairments:  Abnormal gait, Decreased activity tolerance, Decreased balance, Decreased range of motion, Difficulty walking, Decreased strength, Pain, Obesity  Visit Diagnosis: Stiffness of left knee, not elsewhere classified  Acute pain of left knee  Other abnormalities of gait and mobility  Muscle weakness (generalized)     Problem List Patient Active Problem List   Diagnosis Date Noted  . Patellar subluxation, left, initial encounter 12/19/2019  . Morbid obesity with body mass index (BMI) of 50.0 to 59.9 in adult ([place]) 05/29/2018  . OSA on CPAP 05/29/2018  . Class 3 obesity with alveolar hypoventilation, serious comorbidity, and body mass index (BMI) of 50.0 to 59.9 in adult ([place]) 01/12/2018  . Sleep related headaches 01/12/2018  . Intractable chronic cluster headache 01/12/2018  . Excessive daytime sleepiness 01/12/2018  . Sleeps in sitting position due to orthopnea 01/12/2018  . Other fatigue 11/15/2017  . Shortness of breath on exertion 11/15/2017  . Type 2 diabetes mellitus without complication, without long-term current use of insulin ([place]) 11/15/2017  . Essential hypertension 11/15/2017  . Other hyperlipidemia 11/15/2017  . Allergic rhinitis 10/12/2017  . DOE (dyspnea on exertion)   . Chest pain 08/10/2017  . Type 2 diabetes mellitus with hyperlipidemia ([place]) 08/10/2017  . Personal history of dysmenorrhea 03/10/2017  . Chronic hypertension 02/21/2017  . Dysmenorrhea 01/27/2017  . Tendinitis of left rotator cuff 01/27/2015  . History of herpes simplex infection 12/18/2013  . Flank pain 09/21/2012  . GERD (gastroesophageal reflux disease) 09/21/2012  . Herpes genitalia 09/21/2012    Rayetta Humphrey, PT CLT [phone] 01/17/2020, 9:22 AM  [facility] [address] [place], [place], [address] Phone:  [phone]   Fax:  [phone]  Name: Susan [name] MRN: [id number] Date of Birth: 1971-08-05

## 2020-01-18 ENCOUNTER — Encounter (HOSPITAL_COMMUNITY): Payer: Self-pay | Admitting: Physical Therapy

## 2020-01-18 ENCOUNTER — Ambulatory Visit (HOSPITAL_COMMUNITY): Payer: BC Managed Care – PPO | Admitting: Physical Therapy

## 2020-01-18 DIAGNOSIS — M25562 Pain in left knee: Secondary | ICD-10-CM | POA: Diagnosis not present

## 2020-01-18 DIAGNOSIS — M25662 Stiffness of left knee, not elsewhere classified: Secondary | ICD-10-CM

## 2020-01-18 DIAGNOSIS — R2689 Other abnormalities of gait and mobility: Secondary | ICD-10-CM | POA: Diagnosis not present

## 2020-01-18 DIAGNOSIS — M6281 Muscle weakness (generalized): Secondary | ICD-10-CM

## 2020-01-18 NOTE — Therapy (Signed)
[facility] [address] [place], [place], [address] Phone: [phone]   Fax:  [phone]  Physical Therapy Treatment  Patient Details  Name: Susan [name] MRN: [id number] Date of Birth: 06-22-1971 Referring Provider (PT): Arther Abbott   Encounter Date: 01/18/2020  PT End of Session - 01/18/20 0834    Visit Number  6    Number of Visits  12    Date for PT Re-Evaluation  02/06/20    Authorization Type  BCBSPPO    Authorization - Visit Number  6    Authorization - Number of Visits  60    Progress Note Due on Visit  10    PT Start Time  0833    PT Stop Time  0912    PT Time Calculation (min)  39 min    Activity Tolerance  Patient tolerated treatment well    Behavior During Therapy  [facility] for tasks assessed/performed       Past Medical History:  Diagnosis Date  . Anxiety   . Diabetes mellitus ([place])   . Fluid retention   . GERD (gastroesophageal reflux disease)   . Heart murmur   . Herpes simplex without mention of complication   . History of herpes simplex infection 12/18/2013  . Hyperlipidemia   . Hypertension   . Ovarian cyst   . Reflux   . Sciatica   . SOB (shortness of breath)     Past Surgical History:  Procedure Laterality Date  . DILATION AND CURETTAGE OF UTERUS    . ENDOMETRIAL ABLATION    . TUBAL LIGATION  2006    There were no vitals filed for this visit.  Subjective Assessment - 01/18/20 0836    Subjective  Some soreness and pain in left knee    Limitations  Standing;Walking;House hold activities    How long can you sit comfortably?  able to sit for 30 minutes now was 15-20 minuts    How long can you stand comfortably?  30 minutes was 15-20 minutes    How long can you walk comfortably?  25 minutes 10 minutes    Patient Stated Goals  I want my knee to feel better    Currently in Pain?  Yes    Pain Score  3     Pain Location  Knee    Pain Orientation  Left    Pain Descriptors / Indicators  Sore     Pain Type  Acute pain    Pain Onset  More than a month ago         [facility] PT Assessment - 01/18/20 0001      Assessment   Medical Diagnosis  Lt knee patella subluxation     Referring Provider (PT)  Arther Abbott    Onset Date/Surgical Date  11/22/19                    [facility] Adult PT Treatment/Exercise - 01/18/20 0001      Balance Poses: Yoga   Warrior I  5 reps;30 seconds   B, with verbal and tactile cues, additional 3 sets with PT    Warrior II  5 reps;30 seconds   B, with verbal and tactile cues, additional 3 sets with PT      Knee/Hip Exercises: Standing   Other Standing Knee Exercises  crossover step ups 4" step 3x8 B                PT  Short Term Goals - 01/01/20 0908      PT SHORT TERM GOAL #1   Title  Pt to be I in HEP to improve knee flexion to 110 to allow pt to be comfortable sittng for an hour.    Time  3    Period  Weeks    Status  On-going    Target Date  01/16/20      PT SHORT TERM GOAL #2   Title  Pt Left knee pain to be no greater than a 5/10 to allow pt to walk for 20-30 minutes to be able to complete light shopping tasks.    Time  3    Period  Weeks    Status  On-going      PT SHORT TERM GOAL #3   Title  PT to be able to stand for 30 minutes at a time to be able to make a quick meal without discomfort.    Time  3    Period  Weeks    Status  On-going        PT Long Term Goals - 01/01/20 0909      PT LONG TERM GOAL #1   Title  Pt LE and core strength to be at least 4+/5 to allow pt to go up and down her apartment steps in a reciprocal manner with one hand hold onto the rails    Time  6    Period  Weeks    Status  On-going      PT LONG TERM GOAL #2   Title  PT ROM of Lt knee to be functional to allow pt to squat down and pick items off the floor when completing house cleaning    Time  6    Period  Weeks    Status  On-going      PT LONG TERM GOAL #3   Title  PT pain in her LT knee to be no greater than a 2/10 to  allow pt to sleep without being disturbed by her knee    Time  6    Period  Weeks    Status  On-going            Plan - 01/18/20 0981    Clinical Impression Statement  Added warrior I and II poses. Tolerated these well and cued patient to activate hips and press into blade edge of feet. No pain tolerated with this. Added crossover step ups and 6" too challenging for patient (minor discomfort noted), but tolerated 4" step well with fatigue noted in hips afterwards.    Examination-Activity Limitations  Bend;Squat;Carry;Locomotion Level;Lift    Examination-Participation Restrictions  Community Activity;Laundry;Yard Work;Cleaning    Stability/Clinical Decision Making  Stable/Uncomplicated    Rehab Potential  Good    PT Frequency  2x / week    PT Duration  6 weeks    PT Treatment/Interventions  Gait training;Stair training;Functional mobility training;Therapeutic activities;Therapeutic exercise;Balance training;Patient/family education;Passive range of motion;Manual techniques    PT Next Visit Plan  Continue to progress Lt LE strength and stability.    PT Home Exercise Plan  sit to stand, single leg stance, LAQ, quad sets and heelslides; 5/27:  heel raises, functional squat , step ups; 6/4 wariror I and II poses       Patient will benefit from skilled therapeutic intervention in order to improve the following deficits and impairments:  Abnormal gait, Decreased activity tolerance, Decreased balance, Decreased range of motion, Difficulty walking, Decreased strength,  Pain, Obesity  Visit Diagnosis: Stiffness of left knee, not elsewhere classified  Acute pain of left knee  Other abnormalities of gait and mobility  Muscle weakness (generalized)     Problem List Patient Active Problem List   Diagnosis Date Noted  . Patellar subluxation, left, initial encounter 12/19/2019  . Morbid obesity with body mass index (BMI) of 50.0 to 59.9 in adult ([place]) 05/29/2018  . OSA on CPAP 05/29/2018   . Class 3 obesity with alveolar hypoventilation, serious comorbidity, and body mass index (BMI) of 50.0 to 59.9 in adult ([place]) 01/12/2018  . Sleep related headaches 01/12/2018  . Intractable chronic cluster headache 01/12/2018  . Excessive daytime sleepiness 01/12/2018  . Sleeps in sitting position due to orthopnea 01/12/2018  . Other fatigue 11/15/2017  . Shortness of breath on exertion 11/15/2017  . Type 2 diabetes mellitus without complication, without long-term current use of insulin ([place]) 11/15/2017  . Essential hypertension 11/15/2017  . Other hyperlipidemia 11/15/2017  . Allergic rhinitis 10/12/2017  . DOE (dyspnea on exertion)   . Chest pain 08/10/2017  . Type 2 diabetes mellitus with hyperlipidemia ([place]) 08/10/2017  . Personal history of dysmenorrhea 03/10/2017  . Chronic hypertension 02/21/2017  . Dysmenorrhea 01/27/2017  . Tendinitis of left rotator cuff 01/27/2015  . History of herpes simplex infection 12/18/2013  . Flank pain 09/21/2012  . GERD (gastroesophageal reflux disease) 09/21/2012  . Herpes genitalia 09/21/2012    9:13 AM, 01/18/20 Jerene Pitch, DPT Physical Therapy with [facility]  [phone] office   [facility] [address] [place], [place], [address] Phone: [phone]   Fax:  [phone]  Name: Susan [name] MRN: [id number] Date of Birth: Aug 12, 1971

## 2020-01-22 ENCOUNTER — Ambulatory Visit (INDEPENDENT_AMBULATORY_CARE_PROVIDER_SITE_OTHER): Payer: BC Managed Care – PPO

## 2020-01-22 ENCOUNTER — Other Ambulatory Visit: Payer: Self-pay

## 2020-01-22 ENCOUNTER — Ambulatory Visit
Admission: EM | Admit: 2020-01-22 | Discharge: 2020-01-22 | Disposition: A | Discharge status: Home / Self Care | Payer: BC Managed Care – PPO | Attending: Emergency Medicine | Admitting: Emergency Medicine

## 2020-01-22 DIAGNOSIS — R0602 Shortness of breath: Secondary | ICD-10-CM | POA: Diagnosis not present

## 2020-01-22 DIAGNOSIS — J209 Acute bronchitis, unspecified: Secondary | ICD-10-CM

## 2020-01-22 DIAGNOSIS — J069 Acute upper respiratory infection, unspecified: Secondary | ICD-10-CM | POA: Diagnosis not present

## 2020-01-22 DIAGNOSIS — R509 Fever, unspecified: Secondary | ICD-10-CM | POA: Diagnosis not present

## 2020-01-22 DIAGNOSIS — R05 Cough: Secondary | ICD-10-CM

## 2020-01-22 DIAGNOSIS — Z20822 Contact with and (suspected) exposure to covid-19: Secondary | ICD-10-CM

## 2020-01-22 MED ORDER — PREDNISONE 10 MG (21) PO TBPK
ORAL_TABLET | Freq: Every day | ORAL | 0 refills | Status: DC
Start: 2020-01-22 — End: 2020-06-17

## 2020-01-22 MED ORDER — ALBUTEROL SULFATE HFA 108 (90 BASE) MCG/ACT IN AERS: 1.0000 | INHALATION_SPRAY | Freq: Four times a day (QID) | RESPIRATORY_TRACT | 0 refills | Status: DC | PRN

## 2020-01-22 MED ORDER — IBUPROFEN 800 MG PO TABS
800.0000 mg | ORAL_TABLET | Freq: Once | ORAL | Status: AC
  Administered 2020-01-22: 800 mg via ORAL

## 2020-01-22 MED ORDER — BENZONATATE 100 MG PO CAPS
100.0000 mg | ORAL_CAPSULE | Freq: Three times a day (TID) | ORAL | 0 refills | Status: DC
Start: 2020-01-22 — End: 2020-06-17

## 2020-01-22 NOTE — Discharge Instructions (Signed)
X-ray negative for pneumonia, concerning for bronchitis COVID testing ordered.  It will take between 2-5 days for test results.  Someone will contact you regarding abnormal results.    In the meantime: You should remain isolated in your home for 10 days from symptom onset AND greater than 72 hours after symptoms resolution (absence of fever without the use of fever-reducing medication and improvement in respiratory symptoms), whichever is longer Get plenty of rest and push fluids Tessalon Perles prescribed for cough Prednisone and albuterol inhaler prescribed.  Use OTC zyrtec for nasal congestion, runny nose, and/or sore throat Use OTC flonase for nasal congestion and runny nose Use medications daily for symptom relief Use OTC medications like ibuprofen or tylenol as needed fever or pain Call or go to the ED if you have any new or worsening symptoms such as fever, worsening cough, shortness of breath, chest tightness, chest pain, turning blue, changes in mental status, etc..Marland Kitchen

## 2020-01-22 NOTE — ED Provider Notes (Signed)
[facility]   [phone] 01/22/20 Arrival Time: 1326   CC: COVID symptoms  SUBJECTIVE: History from: patient.  Susan [name] is a 49 y.o. female who presents with SOB, fever, runny nose, congestion, dry cough, SOB, headache, and body aches x 4 days.  Denies sick exposure to COVID, flu or strep.  Has tried OTC medications without relief.  Symptoms are made worse with deep breath.  Reports previous symptoms in the past with PNA.   Denies sore throat, wheezing, chest pain, nausea, changes in bowel or bladder habits.    Had COVID test this morning.  Health at work advised patient to come here to be seen.    ROS: As per HPI.  All other pertinent ROS negative.     Past Medical History:  Diagnosis Date  . Anxiety   . Diabetes mellitus ([place])   . Fluid retention   . GERD (gastroesophageal reflux disease)   . Heart murmur   . Herpes simplex without mention of complication   . History of herpes simplex infection 12/18/2013  . Hyperlipidemia   . Hypertension   . Ovarian cyst   . Reflux   . Sciatica   . SOB (shortness of breath)    Past Surgical History:  Procedure Laterality Date  . DILATION AND CURETTAGE OF UTERUS    . ENDOMETRIAL ABLATION    . TUBAL LIGATION  2006   No Known Allergies No current facility-administered medications on file prior to encounter.   Current Outpatient Medications on File Prior to Encounter  Medication Sig Dispense Refill  . lisinopril (PRINIVIL,ZESTRIL) 10 MG tablet TAKE 1 TABLET(10 MG) BY MOUTH DAILY 90 tablet 1  . metFORMIN (GLUCOPHAGE) 1000 MG tablet Take 1 tablet (1,000 mg total) by mouth 2 (two) times daily. 180 tablet 1  . methocarbamol (ROBAXIN) 500 MG tablet Take 1 tablet (500 mg total) by mouth 4 (four) times daily. 16 tablet 0  . montelukast (SINGULAIR) 10 MG tablet Take 1 tablet (10 mg total) by mouth at bedtime. 90 tablet 1  . rosuvastatin (CRESTOR) 10 MG tablet Take 1 tablet (10 mg total) by mouth daily. 90 tablet 1  .  valACYclovir (VALTREX) 1000 MG tablet TAKE 1 TABLET BY MOUTH TWICE DAILY 60 tablet 3  . Vitamin D, Ergocalciferol, (DRISDOL) 1.25 MG (50000 UNIT) CAPS capsule Take 50,000 Units by mouth once a week.     Social History   Socioeconomic History  . Marital status: Married    Spouse name: [employer]   . Number of children: 2  . Years of education: Not on file  . Highest education level: Not on file  Occupational History  . Occupation: Chartered certified accountant  Tobacco Use  . Smoking status: Never Smoker  . Smokeless tobacco: Never Used  Substance and Sexual Activity  . Alcohol use: Yes    Comment: occ. 2 drinks/month  . Drug use: No  . Sexual activity: Yes    Partners: Male    Birth control/protection: Surgical    Comment: btl and ablation  Other Topics Concern  . Not on file  Social History Narrative   Married for over 14 years.   Works at [employer], works as a Designer, multimedia.   Has 2 children, 76, 44 years old.   Enjoys time with family and movies.    Social Determinants of Health   Financial Resource Strain:   . Difficulty of Paying Living Expenses:   Food Insecurity:   . Worried About Charity fundraiser in  the Last Year:   . [place] in the Last Year:   Transportation Needs:   . Film/video editor (Medical):   Marland Kitchen Lack of Transportation (Non-Medical):   Physical Activity:   . Days of Exercise per Week:   . Minutes of Exercise per Session:   Stress:   . Feeling of Stress :   Social Connections:   . Frequency of Communication with Friends and Family:   . Frequency of Social Gatherings with Friends and Family:   . Attends Religious Services:   . Active Member of Clubs or Organizations:   . Attends Archivist Meetings:   Marland Kitchen Marital Status:   Intimate Partner Violence:   . Fear of Current or Ex-Partner:   . Emotionally Abused:   Marland Kitchen Physically Abused:   . Sexually Abused:    Family History  Problem Relation Age of Onset  . Hypertension Maternal Grandmother   . Breast  cancer Other   . Diabetes Mother   . High blood pressure Mother   . High Cholesterol Mother   . Diabetes Father   . High blood pressure Father   . High Cholesterol Father   . Alcoholism Father   . Obesity Father   . Hypertension Brother   . Hyperlipidemia Brother   . Lung cancer Paternal Grandmother   . Colon cancer Maternal Grandfather     OBJECTIVE:  Vitals:   01/22/20 1332  BP: (!) 158/82  Pulse: (!) 117  Resp: 18  Temp: (!) 101.8 F (38.8 C)  TempSrc: Oral  SpO2: 93%     General appearance: alert; appears fatigued, but nontoxic; speaking in full sentences and tolerating own secretions HEENT: NCAT; Ears: EACs clear, TMs pearly gray; Eyes: PERRL.  EOM grossly intact. Nose: nares patent without rhinorrhea, turbinates swollen and erythematous, Throat: oropharynx clear, tonsils non erythematous or enlarged, uvula midline  Neck: supple without LAD Lungs: unlabored respirations, symmetrical air entry; cough: mild; no respiratory distress; wheezing heard over mid RT lobe Heart: tachycardic Skin: warm and dry Psychological: alert and cooperative; normal mood and affect   DIAGNOSTIC STUDIES:  DG Chest 2 View  Result Date: 01/22/2020 CLINICAL DATA:  Cough, fever and shortness of breath. EXAM: CHEST - 2 VIEW COMPARISON:  08/10/2017 FINDINGS: The cardiac silhouette, mediastinal and hilar contours are within normal limits. There are moderate bronchitic type changes which could suggest bronchitis or interstitial pneumonitis. No focal airspace consolidation or pleural effusion. No worrisome pulmonary lesions. The bony thorax is intact. IMPRESSION: Bronchitic lung changes could suggest bronchitis or interstitial pneumonitis. No focal infiltrates or effusions. Electronically Signed   By: Marijo Sanes M.D.   On: 01/22/2020 14:25    X-rays negative for infiltrate or effusion  I have reviewed the x-rays myself and the radiologist interpretation. I am in agreement with the radiologist  interpretation.     ASSESSMENT & PLAN:  1. Viral URI with cough   2. Suspected COVID-19 virus infection   3. Acute bronchitis, unspecified organism     Meds ordered this encounter  Medications  . ibuprofen (ADVIL) tablet 800 mg  . predniSONE (STERAPRED UNI-PAK 21 TAB) 10 MG (21) TBPK tablet    Sig: Take by mouth daily. Take 6 tabs by mouth daily  for 2 days, then 5 tabs for 2 days, then 4 tabs for 2 days, then 3 tabs for 2 days, 2 tabs for 2 days, then 1 tab by mouth daily for 2 days    Dispense:  42  tablet    Refill:  0    Order Specific Question:   Supervising Provider    Answer:   Raylene Everts [[id number]]  . albuterol (VENTOLIN HFA) 108 (90 Base) MCG/ACT inhaler    Sig: Inhale 1-2 puffs into the lungs every 6 (six) hours as needed for wheezing or shortness of breath.    Dispense:  18 g    Refill:  0    Order Specific Question:   Supervising Provider    Answer:   Raylene Everts [[id number]]  . benzonatate (TESSALON) 100 MG capsule    Sig: Take 1 capsule (100 mg total) by mouth every 8 (eight) hours.    Dispense:  21 capsule    Refill:  0    Order Specific Question:   Supervising Provider    Answer:   Raylene Everts [[id number]]   X-ray negative for pneumonia, concerning for bronchitis COVID testing ordered.  It will take between 2-5 days for test results.  Someone will contact you regarding abnormal results.    In the meantime: You should remain isolated in your home for 10 days from symptom onset AND greater than 72 hours after symptoms resolution (absence of fever without the use of fever-reducing medication and improvement in respiratory symptoms), whichever is longer Get plenty of rest and push fluids Tessalon Perles prescribed for cough Prednisone and albuterol inhaler prescribed.  Use OTC zyrtec for nasal congestion, runny nose, and/or sore throat Use OTC flonase for nasal congestion and runny nose Use medications daily for symptom relief Use OTC medications  like ibuprofen or tylenol as needed fever or pain Call or go to the ED if you have any new or worsening symptoms such as fever, worsening cough, shortness of breath, chest tightness, chest pain, turning blue, changes in mental status, etc...   Reviewed expectations re: course of current medical issues. Questions answered. Outlined signs and symptoms indicating need for more acute intervention. Patient verbalized understanding. After Visit Summary given.         Lestine Box, PA-C 01/22/20 1452

## 2020-01-22 NOTE — ED Triage Notes (Signed)
Sent to [facility] for COVID testing. C/o shortness of breath, fever, cough, headache, body aches x 4 days. Pt fully vaccinated as of February.

## 2020-01-24 ENCOUNTER — Encounter (HOSPITAL_COMMUNITY): Payer: BC Managed Care – PPO | Admitting: Physical Therapy

## 2020-01-25 ENCOUNTER — Ambulatory Visit (HOSPITAL_COMMUNITY): Payer: BC Managed Care – PPO | Admitting: Physical Therapy

## 2020-01-31 ENCOUNTER — Ambulatory Visit (HOSPITAL_COMMUNITY): Payer: BC Managed Care – PPO | Admitting: Physical Therapy

## 2020-01-31 ENCOUNTER — Telehealth (HOSPITAL_COMMUNITY): Payer: Self-pay | Admitting: Physical Therapy

## 2020-01-31 NOTE — Telephone Encounter (Signed)
Pt cx on phone tree

## 2020-01-31 NOTE — Telephone Encounter (Signed)
Called pt re missed appointment.  Pt states that she notified the front office that she would not be able to see this patient do to taking her father to the MD.  Rayetta Humphrey, [place] CLT [phone]

## 2020-02-01 ENCOUNTER — Other Ambulatory Visit: Payer: Self-pay

## 2020-02-01 ENCOUNTER — Ambulatory Visit (HOSPITAL_COMMUNITY): Payer: BC Managed Care – PPO | Admitting: Physical Therapy

## 2020-02-01 DIAGNOSIS — R2689 Other abnormalities of gait and mobility: Secondary | ICD-10-CM

## 2020-02-01 DIAGNOSIS — M25662 Stiffness of left knee, not elsewhere classified: Secondary | ICD-10-CM | POA: Diagnosis not present

## 2020-02-01 DIAGNOSIS — M6281 Muscle weakness (generalized): Secondary | ICD-10-CM

## 2020-02-01 DIAGNOSIS — M25562 Pain in left knee: Secondary | ICD-10-CM | POA: Diagnosis not present

## 2020-02-01 NOTE — Therapy (Signed)
[place], [place], [address] Phone: [phone]   Fax:  [phone]  Physical Therapy Treatment  Patient Details  Name: Susan [name] MRN: [id number] Date of Birth: 03-13-1971 Referring Provider (PT): Arther Abbott PHYSICAL THERAPY DISCHARGE SUMMARY  Visits from Start of Care: 7  Current functional level related to goals / functional outcomes: Normal strength and rom    Remaining deficits: Balance    Education / Equipment: HEP Plan: Patient agrees to discharge.  Patient goals were not met. Patient is being discharged due to meeting the stated rehab goals.  ?????      Encounter Date: 02/01/2020   PT End of Session - 02/01/20 0917    Visit Number 7    Number of Visits 7    Date for PT Re-Evaluation 02/06/20    Authorization Type [id number]    Authorization - Visit Number 7    Authorization - Number of Visits 60    Progress Note Due on Visit 10    PT Start Time 0845    PT Stop Time 0915    PT Time Calculation (min) 30 min    Activity Tolerance Patient tolerated treatment well    Behavior During Therapy [facility] for tasks assessed/performed           Past Medical History:  Diagnosis Date  . Anxiety   . Diabetes mellitus ([place])   . Fluid retention   . GERD (gastroesophageal reflux disease)   . Heart murmur   . Herpes simplex without mention of complication   . History of herpes simplex infection 12/18/2013  . Hyperlipidemia   . Hypertension   . Ovarian cyst   . Reflux   . Sciatica   . SOB (shortness of breath)     Past Surgical History:  Procedure Laterality Date  . DILATION AND CURETTAGE OF UTERUS    . ENDOMETRIAL ABLATION    . TUBAL LIGATION  2006    There were no vitals filed for this visit.   Subjective Assessment - 02/01/20 0851    Subjective Pt only having pain when she has been up on her leg a long period of time    Limitations Standing;Walking;House hold activities    How long  can you sit comfortably? able to sit for 30 minutes now was 15-20 minuts    How long can you stand comfortably? 30 minutes was 15-20 minutes    How long can you walk comfortably? 25 minutes 10 minutes    Patient Stated Goals I want my knee to feel better    Currently in Pain? No/denies              [facility] PT Assessment - 02/01/20 0001      Assessment   Medical Diagnosis Lt knee patella subluxation     Referring Provider (PT) Arther Abbott    Onset Date/Surgical Date 11/22/19    Next MD Visit not sure     Prior Therapy none      Precautions   Precautions None      Restrictions   Weight Bearing Restrictions No      Home Environment   Living Environment Private residence    Home Access Stairs to enter    Entrance Stairs-Number of Steps 24   holds both rails, one step at a time    Entrance Stairs-Rails Can reach both    [place] One level      Prior Function   Level  of Independence Independent    Vocation Full time employment    Vocation Requirements sitting     Leisure walking       Cognition   Overall Cognitive Status Within Functional Limits for tasks assessed      Observation/Other Assessments   Focus on Therapeutic Outcomes (FOTO)  73%  27% limtied was 49; 51 % limited       Functional Tests   Functional tests Single leg stance;Sit to Stand      Single Leg Stance   Comments Rt: 34" was 30"; LT 34" was 26"       Sit to Stand   Comments  14 was 9 in 30 seconds       AROM   Left Knee Extension 0   was 8   Left Knee Flexion 124   was 92     Strength   Right Hip Flexion 5/5    Right Hip Extension 4+/5   was 3+   Right Hip ABduction 5/5    Left Hip Flexion 5/5    Left Hip Extension 4+/5   was 3+    Left Hip ABduction 5/5    Right Knee Flexion 5/5    Right Knee Extension 5/5    Left Knee Flexion 5/5   was 4-   Left Knee Extension 5/5   was 3+/5     Ambulation/Gait   Ambulation Distance (Feet) 474 Feet   was 372   Gait Comments 3 minute walk test                           [facility] Adult PT Treatment/Exercise - 02/01/20 0001      Knee/Hip Exercises: Standing   SLS with Vectors x 3 B max of 34"      Knee/Hip Exercises: Seated   Sit to Sand 15 reps                    PT Short Term Goals - 02/01/20 0910      PT SHORT TERM GOAL #1   Title Pt to be I in HEP to improve knee flexion to 110 to allow pt to be comfortable sittng for an hour.    Time 3    Period Weeks    Status Achieved    Target Date 01/16/20      PT SHORT TERM GOAL #2   Title Pt Left knee pain to be no greater than a 5/10 to allow pt to walk for 20-30 minutes to be able to complete light shopping tasks.    Time 3    Period Weeks    Status Achieved      PT SHORT TERM GOAL #3   Title PT to be able to stand for 30 minutes at a time to be able to make a quick meal without discomfort.    Time 3    Period Weeks    Status Achieved             PT Long Term Goals - 02/01/20 0910      PT LONG TERM GOAL #1   Title Pt LE and core strength to be at least 4+/5 to allow pt to go up and down her apartment steps in a reciprocal manner with one hand hold onto the rails    Time 6    Period Weeks    Status Achieved      PT LONG TERM GOAL #  2   Title PT ROM of Lt knee to be functional to allow pt to squat down and pick items off the floor when completing house cleaning    Time 6    Period Weeks    Status Achieved      PT LONG TERM GOAL #3   Title PT pain in her LT knee to be no greater than a 2/10 to allow pt to sleep without being disturbed by her knee    Baseline 01/19/19:  Reports no pain, does feel a pull on Rt side    Time 6    Period Weeks    Status Achieved      PT LONG TERM GOAL #4   Title Patient will demonstrate proper lifting mechanics to improve posture and reduce risk of injury at work environment.    Time 3    Status Achieved                 Plan - 02/01/20 0920    Clinical Impression Statement PT reassessed today  with all goals but balance being met.  PT is comfortable with discharge and will be going to the [facility] to continue working out.  ROM and strength are normal with balance/pain still having slight limitations.    Examination-Activity Limitations Bend;Squat;Carry;Locomotion Level;Lift    Examination-Participation Restrictions Community Activity;Laundry;Yard Work;Cleaning    Stability/Clinical Decision Making Stable/Uncomplicated    Rehab Potential Good    PT Frequency 2x / week    PT Duration 6 weeks    PT Treatment/Interventions Gait training;Stair training;Functional mobility training;Therapeutic activities;Therapeutic exercise;Balance training;Patient/family education;Passive range of motion;Manual techniques    PT Next Visit Plan Discharge    PT Home Exercise Plan sit to stand, single leg stance, LAQ, quad sets and heelslides; 5/27:  heel raises, functional squat , step ups; 6/4 wariror I and II poses           Patient will benefit from skilled therapeutic intervention in order to improve the following deficits and impairments:  Abnormal gait, Decreased activity tolerance, Decreased balance, Decreased range of motion, Difficulty walking, Decreased strength, Pain, Obesity  Visit Diagnosis: Stiffness of left knee, not elsewhere classified  Acute pain of left knee  Other abnormalities of gait and mobility  Muscle weakness (generalized)     Problem List Patient Active Problem List   Diagnosis Date Noted  . Patellar subluxation, left, initial encounter 12/19/2019  . Morbid obesity with body mass index (BMI) of 50.0 to 59.9 in adult ([place]) 05/29/2018  . OSA on CPAP 05/29/2018  . Class 3 obesity with alveolar hypoventilation, serious comorbidity, and body mass index (BMI) of 50.0 to 59.9 in adult ([place]) 01/12/2018  . Sleep related headaches 01/12/2018  . Intractable chronic cluster headache 01/12/2018  . Excessive daytime sleepiness 01/12/2018  . Sleeps in sitting position due to  orthopnea 01/12/2018  . Other fatigue 11/15/2017  . Shortness of breath on exertion 11/15/2017  . Type 2 diabetes mellitus without complication, without long-term current use of insulin ([place]) 11/15/2017  . Essential hypertension 11/15/2017  . Other hyperlipidemia 11/15/2017  . Allergic rhinitis 10/12/2017  . DOE (dyspnea on exertion)   . Chest pain 08/10/2017  . Type 2 diabetes mellitus with hyperlipidemia ([place]) 08/10/2017  . Personal history of dysmenorrhea 03/10/2017  . Chronic hypertension 02/21/2017  . Dysmenorrhea 01/27/2017  . Tendinitis of left rotator cuff 01/27/2015  . History of herpes simplex infection 12/18/2013  . Flank pain 09/21/2012  . GERD (gastroesophageal reflux disease) 09/21/2012  .  Herpes genitalia 09/21/2012    Rayetta Humphrey, PT CLT [phone] 02/01/2020, 9:22 AM  [place] [address] [place], [place], [address] Phone: [phone]   Fax:  [phone]  Name: Susan [name] MRN: [id number] Date of Birth: 03/24/1971

## 2020-02-07 ENCOUNTER — Encounter (HOSPITAL_COMMUNITY): Payer: BC Managed Care – PPO | Admitting: Physical Therapy

## 2020-02-07 ENCOUNTER — Ambulatory Visit: Payer: BC Managed Care – PPO | Admitting: Orthopedic Surgery

## 2020-02-07 ENCOUNTER — Encounter: Payer: Self-pay | Admitting: Orthopedic Surgery

## 2020-02-07 ENCOUNTER — Other Ambulatory Visit: Payer: Self-pay

## 2020-02-07 VITALS — BP 181/96 | HR 105 | Ht 59.0 in | Wt 250.0 lb

## 2020-02-07 DIAGNOSIS — S83002D Unspecified subluxation of left patella, subsequent encounter: Secondary | ICD-10-CM

## 2020-02-07 NOTE — Progress Notes (Signed)
Chief Complaint  Patient presents with   Knee Pain    left/ has finished therapy feels better    49 year old female treated for patellar subluxation dislocation with a standard traditional protocol finished her physical therapy her only complaints is that the knee pops a little  Examination reveals full range of motion good quadriceps control no subluxation dislocation  Recommend routine activities  Follow-up if any problems  Encounter Diagnosis  Name Primary?   Subluxation of left patella, subsequent encounter Yes

## 2020-02-08 ENCOUNTER — Encounter (HOSPITAL_COMMUNITY): Payer: BC Managed Care – PPO | Admitting: Physical Therapy

## 2020-02-21 DIAGNOSIS — E669 Obesity, unspecified: Secondary | ICD-10-CM | POA: Diagnosis not present

## 2020-02-21 DIAGNOSIS — I10 Essential (primary) hypertension: Secondary | ICD-10-CM | POA: Diagnosis not present

## 2020-02-21 DIAGNOSIS — E782 Mixed hyperlipidemia: Secondary | ICD-10-CM | POA: Diagnosis not present

## 2020-02-21 DIAGNOSIS — E559 Vitamin D deficiency, unspecified: Secondary | ICD-10-CM | POA: Diagnosis not present

## 2020-02-21 DIAGNOSIS — E1169 Type 2 diabetes mellitus with other specified complication: Secondary | ICD-10-CM | POA: Diagnosis not present

## 2020-02-21 DIAGNOSIS — N3000 Acute cystitis without hematuria: Secondary | ICD-10-CM | POA: Diagnosis not present

## 2020-03-05 DIAGNOSIS — G4733 Obstructive sleep apnea (adult) (pediatric): Secondary | ICD-10-CM | POA: Diagnosis not present

## 2020-04-18 DIAGNOSIS — M5442 Lumbago with sciatica, left side: Secondary | ICD-10-CM | POA: Diagnosis not present

## 2020-04-18 DIAGNOSIS — M5441 Lumbago with sciatica, right side: Secondary | ICD-10-CM | POA: Diagnosis not present

## 2020-05-13 ENCOUNTER — Encounter: Payer: Self-pay | Admitting: Orthopaedic Surgery

## 2020-05-13 ENCOUNTER — Other Ambulatory Visit: Payer: Self-pay

## 2020-05-13 ENCOUNTER — Ambulatory Visit: Payer: BC Managed Care – PPO

## 2020-05-13 ENCOUNTER — Ambulatory Visit: Payer: BC Managed Care – PPO | Admitting: Orthopaedic Surgery

## 2020-05-13 VITALS — BP 134/77 | HR 94 | Ht 59.0 in | Wt 261.0 lb

## 2020-05-13 DIAGNOSIS — Z6841 Body Mass Index (BMI) 40.0 and over, adult: Secondary | ICD-10-CM

## 2020-05-13 DIAGNOSIS — M79604 Pain in right leg: Secondary | ICD-10-CM

## 2020-05-13 DIAGNOSIS — M545 Low back pain, unspecified: Secondary | ICD-10-CM

## 2020-05-13 MED ORDER — NAPROXEN 500 MG PO TABS: 500.0000 mg | ORAL_TABLET | Freq: Two times a day (BID) | ORAL | 5 refills | Status: DC

## 2020-05-13 NOTE — Patient Instructions (Signed)
Try the naproxen 1 twice daily WITH meal

## 2020-05-13 NOTE — Progress Notes (Signed)
Patient [name], female DOB:01/20/1971, 49 y.o. RWE:[id number]  Chief Complaint  Patient presents with  . Back Pain    LBP shooting down legs R>L     HPI  Susan [name] is a 49 y.o. female who has lower back pain with right sided sciatica.  It began about four weeks ago.  She denies any trauma.  She has tried Advil, tylenol, ice and heat.    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  She had x-rays done then and was followed for a while by ortho in [place].  It slowly got better. She had right sided paresthesias then.  She has some episodes of pain now and then but it usually goes away after a few days.  Now it is persisting.   She saw her family doctor and was given prednisone dose pack which helped only a little.  An appointment was made here.   Body mass index is 52.72 kg/m.  The patient meets the AMA guidelines for Morbid (severe) obesity with a BMI > 40.0 and I have recommended weight loss.   ROS  Review of Systems  Constitutional: Positive for activity change.  Musculoskeletal: Positive for arthralgias and back pain.  Psychiatric/Behavioral: The patient is nervous/anxious.   All other systems reviewed and are negative.   All other systems reviewed and are negative.  The following is a summary of the past history medically, past history surgically, known current medicines, social history and family history.  This information is gathered electronically by the computer from prior information and documentation.  I review this each visit and have found including this information at this point in the chart is beneficial and informative.    Past Medical History:  Diagnosis Date  . Anxiety   . Diabetes mellitus ([place])   . Fluid retention   . GERD (gastroesophageal reflux disease)   . Heart murmur   . Herpes simplex without mention of complication   . History of herpes simplex infection 12/18/2013  . Hyperlipidemia   . Hypertension    . Ovarian cyst   . Reflux   . Sciatica   . SOB (shortness of breath)     Past Surgical History:  Procedure Laterality Date  . DILATION AND CURETTAGE OF UTERUS    . ENDOMETRIAL ABLATION    . TUBAL LIGATION  2006    Family History  Problem Relation Age of Onset  . Hypertension Maternal Grandmother   . Breast cancer Other   . Diabetes Mother   . High blood pressure Mother   . High Cholesterol Mother   . Diabetes Father   . High blood pressure Father   . High Cholesterol Father   . Alcoholism Father   . Obesity Father   . Hypertension Brother   . Hyperlipidemia Brother   . Lung cancer Paternal Grandmother   . Colon cancer Maternal Grandfather     Social History Social History   Tobacco Use  . Smoking status: Never Smoker  . Smokeless tobacco: Never Used  Vaping Use  . Vaping Use: Never used  Substance Use Topics  . Alcohol use: Yes    Comment: occ. 2 drinks/month  . Drug use: No    No Known Allergies  Current Outpatient Medications  Medication Sig Dispense Refill  . albuterol (VENTOLIN HFA) 108 (90 Base) MCG/ACT inhaler Inhale 1-2 puffs into the lungs every 6 (six) hours as needed for wheezing or shortness of  breath. 18 g 0  . lisinopril (PRINIVIL,ZESTRIL) 10 MG tablet TAKE 1 TABLET(10 MG) BY MOUTH DAILY 90 tablet 1  . metFORMIN (GLUCOPHAGE) 1000 MG tablet Take 1 tablet (1,000 mg total) by mouth 2 (two) times daily. 180 tablet 1  . methocarbamol (ROBAXIN) 500 MG tablet Take 1 tablet (500 mg total) by mouth 4 (four) times daily. 16 tablet 0  . montelukast (SINGULAIR) 10 MG tablet Take 1 tablet (10 mg total) by mouth at bedtime. 90 tablet 1  . rosuvastatin (CRESTOR) 10 MG tablet Take 1 tablet (10 mg total) by mouth daily. 90 tablet 1  . valACYclovir (VALTREX) 1000 MG tablet TAKE 1 TABLET BY MOUTH TWICE DAILY 60 tablet 3  . Vitamin D, Ergocalciferol, (DRISDOL) 1.25 MG (50000 UNIT) CAPS capsule Take 50,000 Units by mouth once a week.    . benzonatate (TESSALON) 100  MG capsule Take 1 capsule (100 mg total) by mouth every 8 (eight) hours. (Patient not taking: Reported on 05/13/2020) 21 capsule 0  . naproxen (NAPROSYN) 500 MG tablet Take 1 tablet (500 mg total) by mouth 2 (two) times daily with a meal. 60 tablet 5  . predniSONE (STERAPRED UNI-PAK 21 TAB) 10 MG (21) TBPK tablet Take by mouth daily. Take 6 tabs by mouth daily  for 2 days, then 5 tabs for 2 days, then 4 tabs for 2 days, then 3 tabs for 2 days, 2 tabs for 2 days, then 1 tab by mouth daily for 2 days (Patient not taking: Reported on 05/13/2020) 42 tablet 0   No current facility-administered medications for this visit.     Physical Exam  Blood pressure 134/77, pulse 94, height 4\' 11"  (1.499 m), weight 261 lb (118.4 kg).  Constitutional: overall normal hygiene, normal nutrition, well developed, normal grooming, normal body habitus. Assistive device:none  Musculoskeletal: gait and station Limp none, muscle tone and strength are normal, no tremors or atrophy is present.  .  Neurological: coordination overall normal.  Deep tendon reflex/nerve stretch intact.  Sensation normal.  Cranial nerves II-XII intact.   Skin:   Normal overall no scars, lesions, ulcers or rashes. No psoriasis.  Psychiatric: Alert and oriented x 3.  Recent memory intact, remote memory unclear.  Normal mood and affect. Well groomed.  Good eye contact.  Cardiovascular: overall no swelling, no varicosities, no edema bilaterally, normal temperatures of the legs and arms, no clubbing, cyanosis and good capillary refill.  Lymphatic: palpation is normal.  Spine/Pelvis examination:  Inspection:  Overall, sacoiliac joint benign and hips nontender; without crepitus or defects.   Thoracic spine inspection: Alignment normal without kyphosis present   Lumbar spine inspection:  Alignment  with normal lumbar lordosis, without scoliosis apparent.   Thoracic spine palpation:  without tenderness of spinal processes   Lumbar spine  palpation: without tenderness of lumbar area; without tightness of lumbar muscles    Range of Motion:   Lumbar flexion, forward flexion is normal without pain or tenderness    Lumbar extension is full without pain or tenderness   Left lateral bend is normal without pain or tenderness   Right lateral bend is normal without pain or tenderness   Straight leg raising is normal  Strength & tone: normal   Stability overall normal stability All other systems reviewed and are negative   The patient has been educated about the nature of the problem(s) and counseled on treatment options.  The patient appeared to understand what I have discussed and is in agreement with it.  Encounter Diagnoses  Name Primary?  . Lumbar pain with radiation down right leg Yes  . Body mass index 50.0-59.9, adult ([place])   . Morbid obesity ([facility])    X-rays were done of the lumbar spine, reported separately.  PLAN Call if any problems.  Precautions discussed.  Continue current medications. Begin Naprosyn.  Return to clinic 2 weeks   Consider MRI if not improved.  Electronically Signed Sanjuana Kava, MD 9/28/20218:49 AM

## 2020-05-27 ENCOUNTER — Ambulatory Visit: Payer: BC Managed Care – PPO | Admitting: Orthopaedic Surgery

## 2020-05-29 DIAGNOSIS — U071 COVID-19: Secondary | ICD-10-CM | POA: Diagnosis not present

## 2020-06-17 ENCOUNTER — Ambulatory Visit: Payer: BC Managed Care – PPO | Admitting: Orthopaedic Surgery

## 2020-06-17 ENCOUNTER — Other Ambulatory Visit: Payer: Self-pay

## 2020-06-17 ENCOUNTER — Encounter: Payer: Self-pay | Admitting: Orthopaedic Surgery

## 2020-06-17 VITALS — BP 158/84 | HR 85 | Ht 60.0 in | Wt 260.0 lb

## 2020-06-17 DIAGNOSIS — Z6841 Body Mass Index (BMI) 40.0 and over, adult: Secondary | ICD-10-CM

## 2020-06-17 DIAGNOSIS — M79604 Pain in right leg: Secondary | ICD-10-CM | POA: Diagnosis not present

## 2020-06-17 DIAGNOSIS — M545 Low back pain, unspecified: Secondary | ICD-10-CM

## 2020-06-17 NOTE — Progress Notes (Signed)
Patient IO:Susan [name], female DOB:1971/03/16, 49 y.o. DJM:[id number]  Chief Complaint  Patient presents with  . Back Pain    HPI  Susan [name] is a 49 y.o. female who has lower back pain. She has good and bad days.  She is taking her medicine and doing her exercises. She needs handicap permit for parking at work.  I filled out the forms.  She has no weakness, no new trauma.   Body mass index is 50.78 kg/m.  The patient meets the AMA guidelines for Morbid (severe) obesity with a BMI > 40.0 and I have recommended weight loss.   ROS  Review of Systems  Constitutional: Positive for activity change.  Musculoskeletal: Positive for arthralgias and back pain.  Psychiatric/Behavioral: The patient is nervous/anxious.   All other systems reviewed and are negative.   All other systems reviewed and are negative.  The following is a summary of the past history medically, past history surgically, known current medicines, social history and family history.  This information is gathered electronically by the computer from prior information and documentation.  I review this each visit and have found including this information at this point in the chart is beneficial and informative.    Past Medical History:  Diagnosis Date  . Anxiety   . Diabetes mellitus ([place])   . Fluid retention   . GERD (gastroesophageal reflux disease)   . Heart murmur   . Herpes simplex without mention of complication   . History of herpes simplex infection 12/18/2013  . Hyperlipidemia   . Hypertension   . Ovarian cyst   . Reflux   . Sciatica   . SOB (shortness of breath)     Past Surgical History:  Procedure Laterality Date  . DILATION AND CURETTAGE OF UTERUS    . ENDOMETRIAL ABLATION    . TUBAL LIGATION  2006    Family History  Problem Relation Age of Onset  . Hypertension Maternal Grandmother   . Breast cancer Other   . Diabetes Mother   . High blood pressure Mother   . High Cholesterol Mother    . Diabetes Father   . High blood pressure Father   . High Cholesterol Father   . Alcoholism Father   . Obesity Father   . Hypertension Brother   . Hyperlipidemia Brother   . Lung cancer Paternal Grandmother   . Colon cancer Maternal Grandfather     Social History Social History   Tobacco Use  . Smoking status: Never Smoker  . Smokeless tobacco: Never Used  Vaping Use  . Vaping Use: Never used  Substance Use Topics  . Alcohol use: Yes    Comment: occ. 2 drinks/month  . Drug use: No    No Known Allergies  Current Outpatient Medications  Medication Sig Dispense Refill  . albuterol (VENTOLIN HFA) 108 (90 Base) MCG/ACT inhaler Inhale 1-2 puffs into the lungs every 6 (six) hours as needed for wheezing or shortness of breath. 18 g 0  . cyclobenzaprine (FLEXERIL) 10 MG tablet Take 10 mg by mouth at bedtime as needed.    Marland Kitchen lisinopril (PRINIVIL,ZESTRIL) 10 MG tablet TAKE 1 TABLET(10 MG) BY MOUTH DAILY 90 tablet 1  . metFORMIN (GLUCOPHAGE) 1000 MG tablet Take 1 tablet (1,000 mg total) by mouth 2 (two) times daily. 180 tablet 1  . methocarbamol (ROBAXIN) 500 MG tablet Take 1 tablet (500 mg total) by mouth 4 (four) times daily. 16 tablet 0  . metoprolol succinate (TOPROL-XL) 25 MG  24 hr tablet Take 25 mg by mouth daily.    . montelukast (SINGULAIR) 10 MG tablet Take 1 tablet (10 mg total) by mouth at bedtime. 90 tablet 1  . naproxen (NAPROSYN) 500 MG tablet Take 1 tablet (500 mg total) by mouth 2 (two) times daily with a meal. 60 tablet 5  . rosuvastatin (CRESTOR) 10 MG tablet Take 1 tablet (10 mg total) by mouth daily. 90 tablet 1  . valACYclovir (VALTREX) 1000 MG tablet TAKE 1 TABLET BY MOUTH TWICE DAILY 60 tablet 3  . Vitamin D, Ergocalciferol, (DRISDOL) 1.25 MG (50000 UNIT) CAPS capsule Take 50,000 Units by mouth once a week.     No current facility-administered medications for this visit.     Physical Exam  Blood pressure (!) 158/84, pulse 85, height 5' (1.524 m), weight 260  lb (117.9 kg).  Constitutional: overall normal hygiene, normal nutrition, well developed, normal grooming, normal body habitus. Assistive device:none  Musculoskeletal: gait and station Limp none, muscle tone and strength are normal, no tremors or atrophy is present.  .  Neurological: coordination overall normal.  Deep tendon reflex/nerve stretch intact.  Sensation normal.  Cranial nerves II-XII intact.   Skin:   Normal overall no scars, lesions, ulcers or rashes. No psoriasis.  Psychiatric: Alert and oriented x 3.  Recent memory intact, remote memory unclear.  Normal mood and affect. Well groomed.  Good eye contact.  Cardiovascular: overall no swelling, no varicosities, no edema bilaterally, normal temperatures of the legs and arms, no clubbing, cyanosis and good capillary refill.  Spine/Pelvis examination:  Inspection:  Overall, sacoiliac joint benign and hips nontender; without crepitus or defects.   Thoracic spine inspection: Alignment normal without kyphosis present   Lumbar spine inspection:  Alignment  with normal lumbar lordosis, without scoliosis apparent.   Thoracic spine palpation:  without tenderness of spinal processes   Lumbar spine palpation: without tenderness of lumbar area; without tightness of lumbar muscles    Range of Motion:   Lumbar flexion, forward flexion is normal without pain or tenderness    Lumbar extension is full without pain or tenderness   Left lateral bend is normal without pain or tenderness   Right lateral bend is normal without pain or tenderness   Straight leg raising is normal  Strength & tone: normal   Stability overall normal stability  Lymphatic: palpation is normal.  All other systems reviewed and are negative   The patient has been educated about the nature of the problem(s) and counseled on treatment options.  The patient appeared to understand what I have discussed and is in agreement with it. Encounter Diagnoses  Name Primary?   . Lumbar pain with radiation down right leg Yes  . Body mass index 50.0-59.9, adult ([place])   . Morbid obesity ([place])     PLAN Call if any problems.  Precautions discussed.  Continue current medications.   Return to clinic 3 months   Electronically [place], MD 11/2/20213:43 PM

## 2020-09-16 ENCOUNTER — Ambulatory Visit: Payer: BC Managed Care – PPO | Admitting: Orthopaedic Surgery

## 2020-10-06 DIAGNOSIS — E1169 Type 2 diabetes mellitus with other specified complication: Secondary | ICD-10-CM | POA: Diagnosis not present

## 2020-10-06 DIAGNOSIS — I10 Essential (primary) hypertension: Secondary | ICD-10-CM | POA: Diagnosis not present

## 2020-10-06 DIAGNOSIS — E669 Obesity, unspecified: Secondary | ICD-10-CM | POA: Diagnosis not present

## 2020-10-06 DIAGNOSIS — E782 Mixed hyperlipidemia: Secondary | ICD-10-CM | POA: Diagnosis not present

## 2020-10-06 DIAGNOSIS — E559 Vitamin D deficiency, unspecified: Secondary | ICD-10-CM | POA: Diagnosis not present

## 2020-10-06 DIAGNOSIS — Z79899 Other long term (current) drug therapy: Secondary | ICD-10-CM | POA: Diagnosis not present

## 2020-10-13 DIAGNOSIS — E538 Deficiency of other specified B group vitamins: Secondary | ICD-10-CM | POA: Diagnosis not present

## 2020-10-24 ENCOUNTER — Other Ambulatory Visit: Payer: Self-pay | Admitting: Adult Health

## 2020-10-30 DIAGNOSIS — I10 Essential (primary) hypertension: Secondary | ICD-10-CM | POA: Diagnosis not present

## 2020-10-30 DIAGNOSIS — M5442 Lumbago with sciatica, left side: Secondary | ICD-10-CM | POA: Diagnosis not present

## 2020-10-30 DIAGNOSIS — E669 Obesity, unspecified: Secondary | ICD-10-CM | POA: Diagnosis not present

## 2020-12-31 ENCOUNTER — Ambulatory Visit: Payer: BC Managed Care – PPO | Admitting: Podiatry

## 2021-09-02 ENCOUNTER — Other Ambulatory Visit: Payer: Self-pay | Admitting: Adult Health

## 2021-10-26 ENCOUNTER — Other Ambulatory Visit (HOSPITAL_COMMUNITY)
Admission: RE | Admit: 2021-10-26 | Discharge: 2021-10-26 | Disposition: A | Discharge status: Home / Self Care | Payer: BC Managed Care – PPO | Source: Ambulatory Visit | Attending: Obstetrics and Gynecology | Admitting: Obstetrics and Gynecology

## 2021-10-26 DIAGNOSIS — Z01419 Encounter for gynecological examination (general) (routine) without abnormal findings: Secondary | ICD-10-CM | POA: Diagnosis not present

## 2021-10-26 DIAGNOSIS — D219 Benign neoplasm of connective and other soft tissue, unspecified: Secondary | ICD-10-CM | POA: Diagnosis not present

## 2021-10-26 DIAGNOSIS — N914 Secondary oligomenorrhea: Secondary | ICD-10-CM | POA: Diagnosis not present

## 2021-10-28 DIAGNOSIS — D259 Leiomyoma of uterus, unspecified: Secondary | ICD-10-CM | POA: Diagnosis not present

## 2021-10-28 DIAGNOSIS — N914 Secondary oligomenorrhea: Secondary | ICD-10-CM | POA: Diagnosis not present

## 2021-10-28 LAB — CYTOLOGY - PAP
Comment: NEGATIVE
Diagnosis: NEGATIVE
High risk HPV: NEGATIVE

## 2021-11-02 DIAGNOSIS — E782 Mixed hyperlipidemia: Secondary | ICD-10-CM | POA: Diagnosis not present

## 2021-11-02 DIAGNOSIS — E118 Type 2 diabetes mellitus with unspecified complications: Secondary | ICD-10-CM | POA: Diagnosis not present

## 2021-11-02 DIAGNOSIS — I10 Essential (primary) hypertension: Secondary | ICD-10-CM | POA: Diagnosis not present

## 2021-11-07 IMAGING — DX DG CHEST 2V
2 series · 2 of 2 positions shown · non-contrast
Comparison: 08/10/2017

CLINICAL DATA: Cough, fever and shortness of breath.

EXAM:
CHEST - 2 VIEW

[chest pa]
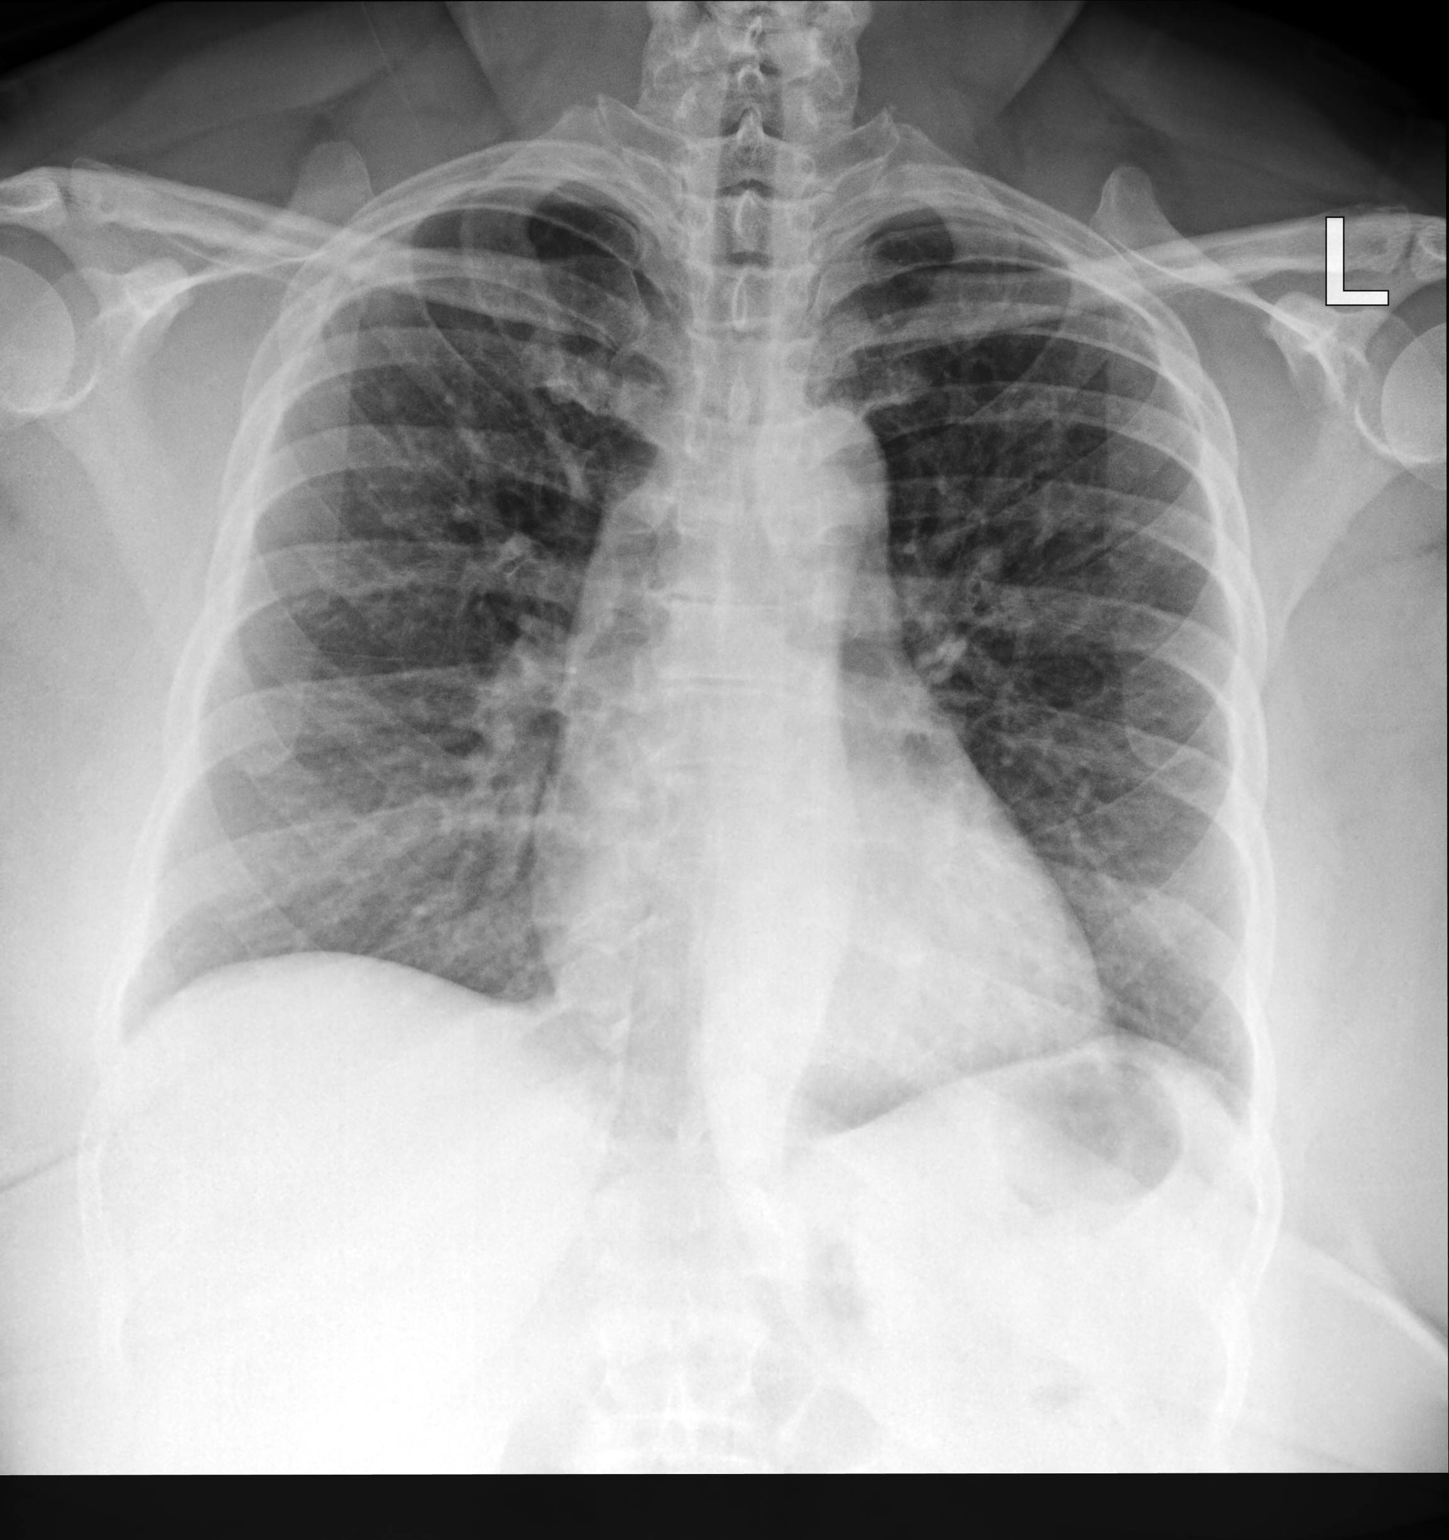

[chest lat]
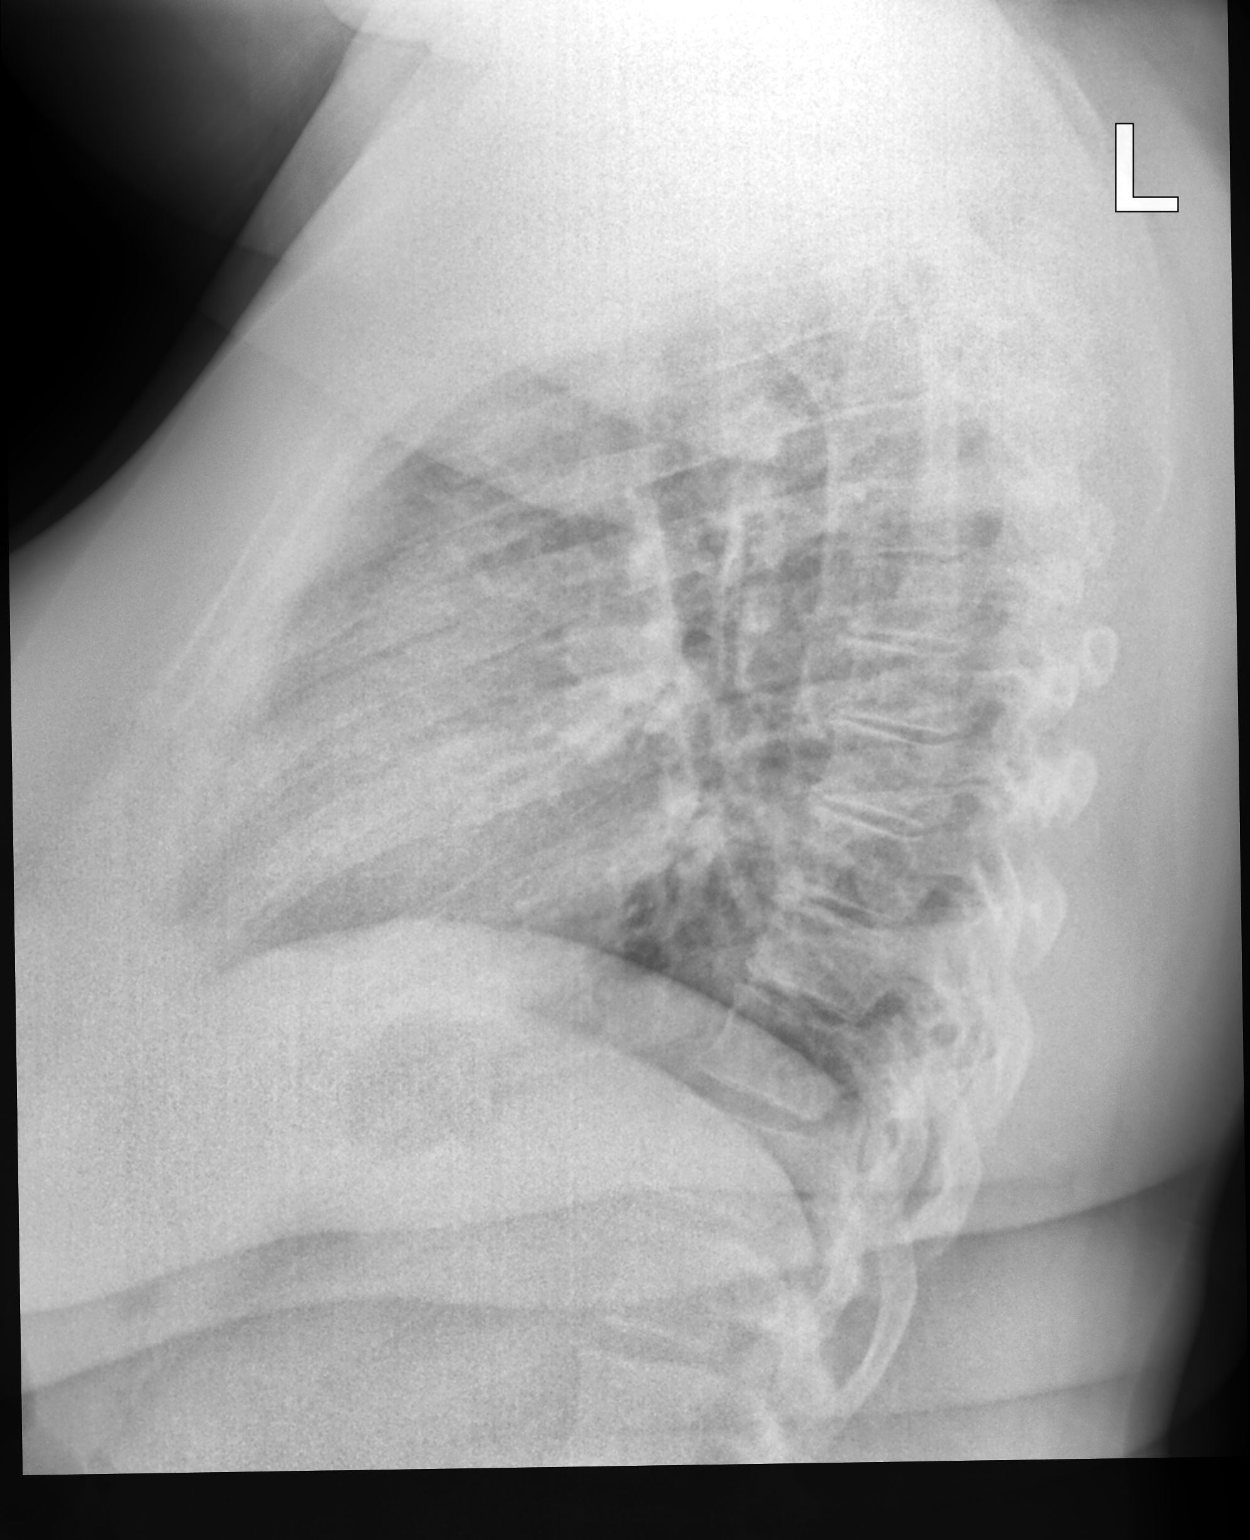

[2 of 2 positions shown; findings below may reference images not displayed]

FINDINGS: The cardiac silhouette, mediastinal and hilar contours are within
normal limits. There are moderate bronchitic type changes which
could suggest bronchitis or interstitial pneumonitis. No focal
airspace consolidation or pleural effusion. No worrisome pulmonary
lesions. The bony thorax is intact.
IMPRESSION: Bronchitic lung changes could suggest bronchitis or interstitial
pneumonitis. No focal infiltrates or effusions.

## 2021-11-23 DIAGNOSIS — N914 Secondary oligomenorrhea: Secondary | ICD-10-CM | POA: Diagnosis not present

## 2021-11-23 DIAGNOSIS — E559 Vitamin D deficiency, unspecified: Secondary | ICD-10-CM | POA: Diagnosis not present

## 2022-02-03 DIAGNOSIS — I10 Essential (primary) hypertension: Secondary | ICD-10-CM | POA: Diagnosis not present

## 2022-02-03 DIAGNOSIS — E1169 Type 2 diabetes mellitus with other specified complication: Secondary | ICD-10-CM | POA: Diagnosis not present

## 2022-02-03 DIAGNOSIS — E782 Mixed hyperlipidemia: Secondary | ICD-10-CM | POA: Diagnosis not present

## 2022-02-22 ENCOUNTER — Ambulatory Visit
Admission: EM | Admit: 2022-02-22 | Discharge: 2022-02-22 | Disposition: A | Discharge status: Home / Self Care | Payer: BC Managed Care – PPO | Attending: Urgent Care | Admitting: Urgent Care

## 2022-02-22 DIAGNOSIS — J019 Acute sinusitis, unspecified: Secondary | ICD-10-CM | POA: Diagnosis not present

## 2022-02-22 DIAGNOSIS — J309 Allergic rhinitis, unspecified: Secondary | ICD-10-CM | POA: Diagnosis not present

## 2022-02-22 DIAGNOSIS — E119 Type 2 diabetes mellitus without complications: Secondary | ICD-10-CM | POA: Diagnosis not present

## 2022-02-22 MED ORDER — PROMETHAZINE-DM 6.25-15 MG/5ML PO SYRP: 5.0000 mL | ORAL_SOLUTION | Freq: Three times a day (TID) | ORAL | 0 refills | Status: DC | PRN

## 2022-02-22 MED ORDER — CETIRIZINE HCL 10 MG PO TABS: 10.0000 mg | ORAL_TABLET | Freq: Every day | ORAL | 0 refills | Status: AC

## 2022-02-22 MED ORDER — PSEUDOEPHEDRINE HCL 60 MG PO TABS: 60.0000 mg | ORAL_TABLET | Freq: Three times a day (TID) | ORAL | 0 refills | Status: DC | PRN

## 2022-02-22 MED ORDER — AMOXICILLIN 875 MG PO TABS: 875.0000 mg | ORAL_TABLET | Freq: Two times a day (BID) | ORAL | 0 refills | Status: DC

## 2022-02-22 NOTE — ED Triage Notes (Signed)
Pt presents with c/o nasal congestion and then developed productive cough for past week

## 2022-02-22 NOTE — ED Provider Notes (Signed)
[place]   MRN: [id number] DOB: 1970-11-10  Subjective:   Susan [name] is a 51 y.o. female presenting for 1 week history of persistent worsening sinus congestion, sinus pressure, coughing.  She has had some intermittent shortness of breath but this is a chronic symptom for her.  Has type 2 diabetes treated without insulin.  Has allergic rhinitis and reports that normally they are not as bad as they are now.  She is consistent with her Flonase.  No fevers, body pains, chest pain.  No history of asthma.  Patient is not a smoker.  No current facility-administered medications for this encounter.  Current Outpatient Medications:    albuterol (VENTOLIN HFA) 108 (90 Base) MCG/ACT inhaler, Inhale 1-2 puffs into the lungs every 6 (six) hours as needed for wheezing or shortness of breath., Disp: 18 g, Rfl: 0   cyclobenzaprine (FLEXERIL) 10 MG tablet, Take 10 mg by mouth at bedtime as needed., Disp: , Rfl:    lisinopril (PRINIVIL,ZESTRIL) 10 MG tablet, TAKE 1 TABLET(10 MG) BY MOUTH DAILY, Disp: 90 tablet, Rfl: 1   metFORMIN (GLUCOPHAGE) 1000 MG tablet, Take 1 tablet (1,000 mg total) by mouth 2 (two) times daily., Disp: 180 tablet, Rfl: 1   methocarbamol (ROBAXIN) 500 MG tablet, Take 1 tablet (500 mg total) by mouth 4 (four) times daily., Disp: 16 tablet, Rfl: 0   metoprolol succinate (TOPROL-XL) 25 MG 24 hr tablet, Take 25 mg by mouth daily., Disp: , Rfl:    montelukast (SINGULAIR) 10 MG tablet, Take 1 tablet (10 mg total) by mouth at bedtime., Disp: 90 tablet, Rfl: 1   naproxen (NAPROSYN) 500 MG tablet, Take 1 tablet (500 mg total) by mouth 2 (two) times daily with a meal., Disp: 60 tablet, Rfl: 5   rosuvastatin (CRESTOR) 10 MG tablet, Take 1 tablet (10 mg total) by mouth daily., Disp: 90 tablet, Rfl: 1   valACYclovir (VALTREX) 1000 MG tablet, TAKE 1 TABLET BY MOUTH TWICE DAILY, Disp: 60 tablet, Rfl: 3   Vitamin D, Ergocalciferol, (DRISDOL) 1.25 MG (50000 UNIT) CAPS capsule, Take  50,000 Units by mouth once a week., Disp: , Rfl:    No Known Allergies  Past Medical History:  Diagnosis Date   Anxiety    Diabetes mellitus ([place])    Fluid retention    GERD (gastroesophageal reflux disease)    Heart murmur    Herpes simplex without mention of complication    History of herpes simplex infection 12/18/2013   Hyperlipidemia    Hypertension    Ovarian cyst    Reflux    Sciatica    SOB (shortness of breath)      Past Surgical History:  Procedure Laterality Date   DILATION AND CURETTAGE OF UTERUS     ENDOMETRIAL ABLATION     TUBAL LIGATION  2006    Family History  Problem Relation Age of Onset   Hypertension Maternal Grandmother    Breast cancer Other    Diabetes Mother    High blood pressure Mother    High Cholesterol Mother    Diabetes Father    High blood pressure Father    High Cholesterol Father    Alcoholism Father    Obesity Father    Hypertension Brother    Hyperlipidemia Brother    Lung cancer Paternal Grandmother    Colon cancer Maternal Grandfather     Social History   Tobacco Use   Smoking status: Never   Smokeless tobacco: Never  Vaping Use  Vaping Use: Never used  Substance Use Topics   Alcohol use: Yes    Comment: occ. 2 drinks/month   Drug use: No    ROS   Objective:   Vitals: BP 139/85   Pulse 93   Temp 98.7 F (37.1 C)   Resp 20   LMP 02/15/2022 (Approximate)   SpO2 96%   Physical Exam Constitutional:      General: She is not in acute distress.    Appearance: Normal appearance. She is well-developed. She is obese. She is not ill-appearing, toxic-appearing or diaphoretic.  HENT:     Head: Normocephalic and atraumatic.     Nose: Congestion present. No rhinorrhea.     Mouth/Throat:     Mouth: Mucous membranes are moist.     Pharynx: No oropharyngeal exudate or posterior oropharyngeal erythema.     Comments: Significant post-nasal drainage.  Eyes:     General: No scleral icterus.       Right eye: No  discharge.        Left eye: No discharge.     Extraocular Movements: Extraocular movements intact.  Cardiovascular:     Rate and Rhythm: Normal rate and regular rhythm.     Heart sounds: Normal heart sounds. No murmur heard.    No friction rub. No gallop.  Pulmonary:     Effort: Pulmonary effort is normal. No respiratory distress.     Breath sounds: No stridor. No wheezing, rhonchi or rales.  Chest:     Chest wall: No tenderness.  Skin:    General: Skin is warm and dry.  Neurological:     General: No focal deficit present.     Mental Status: She is alert and oriented to person, place, and time.  Psychiatric:        Mood and Affect: Mood normal.        Behavior: Behavior normal.     Assessment and Plan :   PDMP not reviewed this encounter.  1. Acute non-recurrent sinusitis, unspecified location   2. Allergic rhinitis, unspecified seasonality, unspecified trigger   3. Type 2 diabetes mellitus treated without insulin ([place])    Will start empiric treatment for sinusitis with amoxicillin.  Recommended supportive care otherwise including the use of oral antihistamine, decongestant. Deferred imaging given clear cardiopulmonary exam, hemodynamically stable vital signs. Will hold off on steroids for now but that would be next appropriate step if no improvement to address her allergic rhinitis. Counseled patient on potential for adverse effects with medications prescribed/recommended today, ER and return-to-clinic precautions discussed, patient verbalized understanding.    Jaynee Eagles, PA-C 02/22/22 1057

## 2022-03-03 DIAGNOSIS — R300 Dysuria: Secondary | ICD-10-CM | POA: Diagnosis not present

## 2022-03-03 DIAGNOSIS — M549 Dorsalgia, unspecified: Secondary | ICD-10-CM | POA: Diagnosis not present

## 2022-03-03 DIAGNOSIS — R350 Frequency of micturition: Secondary | ICD-10-CM | POA: Diagnosis not present

## 2022-03-03 DIAGNOSIS — R3989 Other symptoms and signs involving the genitourinary system: Secondary | ICD-10-CM | POA: Diagnosis not present

## 2022-07-12 DIAGNOSIS — E1169 Type 2 diabetes mellitus with other specified complication: Secondary | ICD-10-CM | POA: Diagnosis not present

## 2022-07-12 DIAGNOSIS — E782 Mixed hyperlipidemia: Secondary | ICD-10-CM | POA: Diagnosis not present

## 2022-07-12 DIAGNOSIS — E669 Obesity, unspecified: Secondary | ICD-10-CM | POA: Diagnosis not present

## 2022-07-12 DIAGNOSIS — I10 Essential (primary) hypertension: Secondary | ICD-10-CM | POA: Diagnosis not present

## 2022-07-12 DIAGNOSIS — E559 Vitamin D deficiency, unspecified: Secondary | ICD-10-CM | POA: Diagnosis not present

## 2022-07-16 ENCOUNTER — Ambulatory Visit
Admission: EM | Admit: 2022-07-16 | Discharge: 2022-07-16 | Disposition: A | Discharge status: Home / Self Care | Payer: BC Managed Care – PPO

## 2022-07-16 ENCOUNTER — Ambulatory Visit (INDEPENDENT_AMBULATORY_CARE_PROVIDER_SITE_OTHER): Payer: BC Managed Care – PPO

## 2022-07-16 ENCOUNTER — Encounter: Payer: Self-pay | Admitting: Emergency Medicine

## 2022-07-16 DIAGNOSIS — S46911A Strain of unspecified muscle, fascia and tendon at shoulder and upper arm level, right arm, initial encounter: Secondary | ICD-10-CM

## 2022-07-16 DIAGNOSIS — M25511 Pain in right shoulder: Secondary | ICD-10-CM

## 2022-07-16 MED ORDER — METHOCARBAMOL 500 MG PO TABS: 500.0000 mg | ORAL_TABLET | Freq: Three times a day (TID) | ORAL | 0 refills | Status: DC | PRN

## 2022-07-16 MED ORDER — METHYLPREDNISOLONE SODIUM SUCC 125 MG IJ SOLR
60.0000 mg | Freq: Once | INTRAMUSCULAR | Status: AC
  Administered 2022-07-16: 60 mg via INTRAMUSCULAR

## 2022-07-16 NOTE — ED Triage Notes (Signed)
Right shoulder pain x 1 weeks.  States she woke up this morning and can barley move arm.  States pain shoots down arm.  No known injury.  States she did pick up 3 cases of water on Sunday.

## 2022-07-16 NOTE — ED Provider Notes (Signed)
[facility]    CSN: [id number] Arrival date & time: 07/16/22  9381      History   Chief Complaint No chief complaint on file.   HPI Susan [name] is a 51 y.o. female.    Right shoulder pain x 1 weeks.  States she woke up this morning and can barley move arm.  States pain shoots down arm.  No known injury.  States she did pick up 3 cases of water on Sunday.      Past Medical History:  Diagnosis Date   Anxiety    Diabetes mellitus ([place])    Fluid retention    GERD (gastroesophageal reflux disease)    Heart murmur    Herpes simplex without mention of complication    History of herpes simplex infection 12/18/2013   Hyperlipidemia    Hypertension    Ovarian cyst    Reflux    Sciatica    SOB (shortness of breath)     Patient Active Problem List   Diagnosis Date Noted   Patellar subluxation, left, initial encounter 12/19/2019   Morbid obesity with body mass index (BMI) of 50.0 to 59.9 in adult ([place]) 05/29/2018   OSA on CPAP 05/29/2018   Class 3 obesity with alveolar hypoventilation, serious comorbidity, and body mass index (BMI) of 50.0 to 59.9 in adult ([place]) 01/12/2018   Sleep related headaches 01/12/2018   Intractable chronic cluster headache 01/12/2018   Excessive daytime sleepiness 01/12/2018   Sleeps in sitting position due to orthopnea 01/12/2018   Other fatigue 11/15/2017   Shortness of breath on exertion 11/15/2017   Type 2 diabetes mellitus without complication, without long-term current use of insulin ([place]) 11/15/2017   Essential hypertension 11/15/2017   Other hyperlipidemia 11/15/2017   Allergic rhinitis 10/12/2017   DOE (dyspnea on exertion)    Chest pain 08/10/2017   Type 2 diabetes mellitus with hyperlipidemia ([place]) 08/10/2017   Personal history of dysmenorrhea 03/10/2017   Chronic hypertension 02/21/2017   Dysmenorrhea 01/27/2017   Tendinitis of left rotator cuff 01/27/2015   History of herpes simplex infection 12/18/2013   Flank  pain 09/21/2012   GERD (gastroesophageal reflux disease) 09/21/2012   Herpes genitalia 09/21/2012    Past Surgical History:  Procedure Laterality Date   DILATION AND CURETTAGE OF UTERUS     ENDOMETRIAL ABLATION     TUBAL LIGATION  2006    OB History     Gravida  3   Para  2   Term  2   Preterm      AB  1   Living  2      SAB  1   IAB      Ectopic      Multiple      Live Births  2            Home Medications    Prior to Admission medications   Medication Sig Start Date End Date Taking? Authorizing Provider  Semaglutide-Weight Management (WEGOVY) 0.25 MG/0.5ML SOAJ Inject 0.25 mg into the skin.   Yes [provider]  albuterol (VENTOLIN HFA) 108 (90 Base) MCG/ACT inhaler Inhale 1-2 puffs into the lungs every 6 (six) hours as needed for wheezing or shortness of breath. 01/22/20   Wurst, [place], PA-C  amoxicillin (AMOXIL) 875 MG tablet Take 1 tablet (875 mg total) by mouth 2 (two) times daily. 02/22/22   Jaynee Eagles, PA-C  cetirizine (ZYRTEC ALLERGY) 10 MG tablet Take 1 tablet (10 mg total) by  mouth daily. 02/22/22   Jaynee Eagles, PA-C  cyclobenzaprine (FLEXERIL) 10 MG tablet Take 10 mg by mouth at bedtime as needed. 05/21/20   [provider]  lisinopril (PRINIVIL,ZESTRIL) 10 MG tablet TAKE 1 TABLET(10 MG) BY MOUTH DAILY 04/25/18   Caren Macadam, MD  metFORMIN (GLUCOPHAGE) 1000 MG tablet Take 1 tablet (1,000 mg total) by mouth 2 (two) times daily. 04/25/18   Caren Macadam, MD  methocarbamol (ROBAXIN) 500 MG tablet Take 1 tablet (500 mg total) by mouth every 8 (eight) hours as needed for muscle spasms. Do not drink alcohol or drive while taking this medication.  May cause drowsiness. 07/16/22   Volney American, PA-C  metoprolol succinate (TOPROL-XL) 25 MG 24 hr tablet Take 25 mg by mouth daily. 05/19/20   [provider]  montelukast (SINGULAIR) 10 MG tablet Take 1 tablet (10 mg total) by mouth at bedtime. 04/25/18   Caren Macadam, MD   naproxen (NAPROSYN) 500 MG tablet Take 1 tablet (500 mg total) by mouth 2 (two) times daily with a meal. 05/13/20   Sanjuana Kava, MD  promethazine-dextromethorphan (PROMETHAZINE-DM) 6.25-15 MG/5ML syrup Take 5 mLs by mouth 3 (three) times daily as needed for cough. 02/22/22   Jaynee Eagles, PA-C  pseudoephedrine (SUDAFED) 60 MG tablet Take 1 tablet (60 mg total) by mouth every 8 (eight) hours as needed for congestion. 02/22/22   Jaynee Eagles, PA-C  rosuvastatin (CRESTOR) 10 MG tablet Take 1 tablet (10 mg total) by mouth daily. 04/25/18   Caren Macadam, MD  valACYclovir (VALTREX) 1000 MG tablet TAKE 1 TABLET BY MOUTH TWICE DAILY 09/02/21   Estill Dooms, NP  Vitamin D, Ergocalciferol, (DRISDOL) 1.25 MG (50000 UNIT) CAPS capsule Take 50,000 Units by mouth once a week. 11/21/19   [provider]    Family History Family History  Problem Relation Age of Onset   Hypertension Maternal Grandmother    Breast cancer Other    Diabetes Mother    High blood pressure Mother    High Cholesterol Mother    Diabetes Father    High blood pressure Father    High Cholesterol Father    Alcoholism Father    Obesity Father    Hypertension Brother    Hyperlipidemia Brother    Lung cancer Paternal Grandmother    Colon cancer Maternal Grandfather     Social History Social History   Tobacco Use   Smoking status: Never   Smokeless tobacco: Never  Vaping Use   Vaping Use: Never used  Substance Use Topics   Alcohol use: Yes    Comment: occ. 2 drinks/month   Drug use: No     Allergies   Mobic [meloxicam]   Review of Systems Review of Systems PER HPI  Physical Exam Triage Vital Signs ED Triage Vitals [07/16/22 0839]  Enc Vitals Group     BP (!) 162/85     Pulse Rate 92     Resp 18     Temp 98.7 F (37.1 C)     Temp Source Oral     SpO2 97 %     Weight      Height      Head Circumference      Peak Flow      Pain Score 10     Pain Loc      Pain Edu?      Excl. in [place]?     No data found.  Updated Vital Signs BP (!) 162/85 (BP Location: Right  Arm)   Pulse 92   Temp 98.7 F (37.1 C) (Oral)   Resp 18   LMP 02/15/2022 (Approximate)   SpO2 97%   Visual Acuity Right Eye Distance:   Left Eye Distance:   Bilateral Distance:    Right Eye Near:   Left Eye Near:    Bilateral Near:     Physical Exam Vitals and nursing note reviewed.  Constitutional:      Appearance: Normal appearance. She is not ill-appearing.  HENT:     Head: Atraumatic.  Eyes:     Extraocular Movements: Extraocular movements intact.     Conjunctiva/sclera: Conjunctivae normal.  Cardiovascular:     Rate and Rhythm: Normal rate and regular rhythm.     Heart sounds: Normal heart sounds.  Pulmonary:     Effort: Pulmonary effort is normal.     Breath sounds: Normal breath sounds.  Musculoskeletal:        General: Tenderness present. No swelling, deformity or signs of injury. Normal range of motion.     Cervical back: Normal range of motion and neck supple.     Comments: Right lateral shoulder tenderness to palpation, no bony deformity palpable to right shoulder and range of motion overall intact but painful still limited exam today.  Grip strength full and equal bilateral hands.  No edema or discoloration noted  Skin:    General: Skin is warm and dry.  Neurological:     Mental Status: She is alert and oriented to person, place, and time.     Comments: Right upper extremity neurovascularly intact  Psychiatric:        Mood and Affect: Mood normal.        Thought Content: Thought content normal.        Judgment: Judgment normal.     UC Treatments / Results  Labs (all labs ordered are listed, but only abnormal results are displayed) Labs Reviewed - No data to display  EKG   Radiology DG Shoulder Right  Result Date: 07/16/2022 CLINICAL DATA:  Right shoulder pain for 1 week. Pain shoots down arm. EXAM: RIGHT SHOULDER - 2+ VIEW COMPARISON:  None Available. FINDINGS:  Acromioclavicular mild inferior joint space narrowing, subchondral cystic change, and peripheral osteophytosis. Mild inferior glenohumeral joint space narrowing and inferior glenoid degenerative osteophytosis. Mild-to-moderate somewhat amorphous and curvilinear calcification superior to the lateral aspect of the humeral head measuring up to 17 mm in length, likely chronic calcific tendinosis of the superior rotator cuff. No acute fracture or dislocation. The visualized portion of the right lung is unremarkable. IMPRESSION: 1. Mild acromioclavicular and glenohumeral osteoarthritis. 2. Mild-to-moderate chronic calcific tendinosis of the superior rotator cuff. Electronically Signed   By: Yvonne Kendall M.D.   On: 07/16/2022 09:08    Procedures Procedures (including critical care time)  Medications Ordered in UC Medications  methylPREDNISolone sodium succinate (SOLU-MEDROL) 125 mg/2 mL injection 60 mg (60 mg Intramuscular Given 07/16/22 0925)    Initial Impression / Assessment and Plan / UC Course  I have reviewed the triage vital signs and the nursing notes.  Pertinent labs & imaging results that were available during my care of the patient were reviewed by me and considered in my medical decision making (see chart for details).     X-ray today showing no acute bony abnormalities, chronic arthritis and tendinosis changes.  Suspect inflammatory, strain etiology.  Treat with IM Solu-Medrol, Robaxin, heat, massage, stretches.  Return for worsening symptoms.  Final Clinical Impressions(s) / UC Diagnoses  Final diagnoses:  Strain of right shoulder, initial encounter   Discharge Instructions   None    ED Prescriptions     Medication Sig Dispense Auth. Provider   methocarbamol (ROBAXIN) 500 MG tablet  (Status: Discontinued) Take 1 tablet (500 mg total) by mouth every 8 (eight) hours as needed for muscle spasms. Do not drink alcohol or drive while taking this medication.  May cause drowsiness. 15  tablet Volney American, [place]   methocarbamol (ROBAXIN) 500 MG tablet Take 1 tablet (500 mg total) by mouth every 8 (eight) hours as needed for muscle spasms. Do not drink alcohol or drive while taking this medication.  May cause drowsiness. 15 tablet Volney American, [place]      PDMP not reviewed this encounter.   Volney American, [place] 07/16/22 1102

## 2022-08-23 DIAGNOSIS — E1169 Type 2 diabetes mellitus with other specified complication: Secondary | ICD-10-CM | POA: Diagnosis not present

## 2022-08-23 DIAGNOSIS — F419 Anxiety disorder, unspecified: Secondary | ICD-10-CM | POA: Diagnosis not present

## 2022-10-12 DIAGNOSIS — E118 Type 2 diabetes mellitus with unspecified complications: Secondary | ICD-10-CM | POA: Diagnosis not present

## 2022-10-12 DIAGNOSIS — F419 Anxiety disorder, unspecified: Secondary | ICD-10-CM | POA: Diagnosis not present

## 2022-10-12 DIAGNOSIS — E782 Mixed hyperlipidemia: Secondary | ICD-10-CM | POA: Diagnosis not present

## 2022-10-12 DIAGNOSIS — I10 Essential (primary) hypertension: Secondary | ICD-10-CM | POA: Diagnosis not present

## 2022-10-14 ENCOUNTER — Encounter: Payer: Self-pay | Admitting: Radiology

## 2022-11-02 DIAGNOSIS — E1169 Type 2 diabetes mellitus with other specified complication: Secondary | ICD-10-CM | POA: Diagnosis not present

## 2022-11-02 DIAGNOSIS — Z13220 Encounter for screening for lipoid disorders: Secondary | ICD-10-CM | POA: Diagnosis not present

## 2022-11-02 DIAGNOSIS — Z0000 Encounter for general adult medical examination without abnormal findings: Secondary | ICD-10-CM | POA: Diagnosis not present

## 2022-11-02 DIAGNOSIS — I10 Essential (primary) hypertension: Secondary | ICD-10-CM | POA: Diagnosis not present

## 2022-11-02 DIAGNOSIS — E782 Mixed hyperlipidemia: Secondary | ICD-10-CM | POA: Diagnosis not present

## 2022-12-03 ENCOUNTER — Emergency Department
Admission: EM | Admit: 2022-12-03 | Discharge: 2022-12-03 | Disposition: A | Discharge status: Home / Self Care | Payer: BC Managed Care – PPO | Attending: Emergency Medicine | Admitting: Emergency Medicine

## 2022-12-03 ENCOUNTER — Ambulatory Visit
Admission: EM | Admit: 2022-12-03 | Discharge: 2022-12-03 | Disposition: A | Discharge status: Home / Self Care | Payer: BC Managed Care – PPO

## 2022-12-03 DIAGNOSIS — Z79899 Other long term (current) drug therapy: Secondary | ICD-10-CM | POA: Diagnosis not present

## 2022-12-03 DIAGNOSIS — E119 Type 2 diabetes mellitus without complications: Secondary | ICD-10-CM | POA: Diagnosis not present

## 2022-12-03 DIAGNOSIS — I10 Essential (primary) hypertension: Secondary | ICD-10-CM | POA: Diagnosis not present

## 2022-12-03 DIAGNOSIS — R070 Pain in throat: Secondary | ICD-10-CM | POA: Diagnosis not present

## 2022-12-03 DIAGNOSIS — J029 Acute pharyngitis, unspecified: Secondary | ICD-10-CM

## 2022-12-03 DIAGNOSIS — Z7984 Long term (current) use of oral hypoglycemic drugs: Secondary | ICD-10-CM | POA: Insufficient documentation

## 2022-12-03 LAB — POCT RAPID STREP A (OFFICE): Rapid Strep A Screen: NEGATIVE

## 2022-12-03 LAB — GROUP A STREP BY PCR: Group A Strep by PCR: NOT DETECTED

## 2022-12-03 MED ORDER — AMOXICILLIN-POT CLAVULANATE 875-125 MG PO TABS: 1.0000 | ORAL_TABLET | Freq: Two times a day (BID) | ORAL | 0 refills | Status: DC

## 2022-12-03 MED ORDER — PREDNISONE 10 MG PO TABS: 10.0000 mg | ORAL_TABLET | Freq: Every day | ORAL | 0 refills | Status: AC

## 2022-12-03 MED ORDER — LIDOCAINE VISCOUS HCL 2 % MT SOLN: 15.0000 mL | OROMUCOSAL | 0 refills | Status: AC | PRN

## 2022-12-03 MED ORDER — DIPHENHYDRAMINE HCL 50 MG/ML IJ SOLN
50.0000 mg | Freq: Once | INTRAMUSCULAR | Status: AC
  Administered 2022-12-03: 50 mg via INTRAVENOUS
  Filled 2022-12-03: qty 1

## 2022-12-03 MED ORDER — SODIUM CHLORIDE 0.9 % IV BOLUS
1000.0000 mL | Freq: Once | INTRAVENOUS | Status: AC
  Administered 2022-12-03: 1000 mL via INTRAVENOUS

## 2022-12-03 MED ORDER — FAMOTIDINE IN NACL 20-0.9 MG/50ML-% IV SOLN
20.0000 mg | Freq: Once | INTRAVENOUS | Status: AC
  Administered 2022-12-03: 20 mg via INTRAVENOUS
  Filled 2022-12-03: qty 50

## 2022-12-03 MED ORDER — METHYLPREDNISOLONE SODIUM SUCC 125 MG IJ SOLR
125.0000 mg | Freq: Once | INTRAMUSCULAR | Status: AC
  Administered 2022-12-03: 125 mg via INTRAVENOUS
  Filled 2022-12-03: qty 2

## 2022-12-03 MED ORDER — AMOXICILLIN-POT CLAVULANATE 875-125 MG PO TABS: 1.0000 | ORAL_TABLET | Freq: Two times a day (BID) | ORAL | 0 refills | Status: AC

## 2022-12-03 MED ORDER — PREDNISONE 10 MG PO TABS: 10.0000 mg | ORAL_TABLET | Freq: Every day | ORAL | 0 refills | Status: DC

## 2022-12-03 NOTE — ED Triage Notes (Signed)
Pt to ED via POV from work. Pt works at cancer center. Pt is currently on lisinopril and believes she is having a reaction. Pt reports sore throat, pain when talking and swallowing. Pt speaking in complete sentences without difficulty.

## 2022-12-03 NOTE — ED Triage Notes (Signed)
Patient to Urgent Care with complaints of sore throat that started this morning. Hoarseness. Pain when talking/ swallowing.   Taking allergy meds/ advil cold and flu.

## 2022-12-03 NOTE — ED Provider Notes (Signed)
[place] EM[facility]ENC[facility] DEPARTMENT AT [facility] Provider Note   CSN: [id number] Arrival date & time: 12/03/22  1335     History  Chief Complaint  Patient presents with   Allergic Reaction    Susan [name] is a 52 y.o. female.  With history of diabetes, GERD, hyperlipidemia, hypertension presents to the emergency department for evaluation of throat swelling.  She states since earlier this morning she has had some tightness in her throat.  No difficulty breathing or swallowing.  She denies any other symptoms such as itching, rash.  She denies any new medications, detergents or soaps.  She is on ACE inhibitor.  She denies any fevers, cough congestion or runny nose.  HPI     Home Medications Prior to Admission medications   Medication Sig Start Date End Date Taking? Authorizing Provider  albuterol (VENTOLIN HFA) 108 (90 Base) MCG/ACT inhaler Inhale 1-2 puffs into the lungs every 6 (six) hours as needed for wheezing or shortness of breath. Patient not taking: Reported on 12/03/2022 01/22/20   Wurst, [place], PA-C  amoxicillin (AMOXIL) 875 MG tablet Take 1 tablet (875 mg total) by mouth 2 (two) times daily. Patient not taking: Reported on 12/03/2022 02/22/22   Wallis Bamberg, PA-C  cetirizine (ZYRTEC ALLERGY) 10 MG tablet Take 1 tablet (10 mg total) by mouth daily. 02/22/22   Wallis Bamberg, PA-C  cyclobenzaprine (FLEXERIL) 10 MG tablet Take 10 mg by mouth at bedtime as needed. Patient not taking: Reported on 12/03/2022 05/21/20   [provider]  lidocaine (XYLOCAINE) 2 % solution Use as directed 15 mLs in the mouth or throat as needed for mouth pain. 12/03/22   Mickie Bail, NP  lisinopril (PRINIVIL,ZESTRIL) 10 MG tablet TAKE 1 TABLET(10 MG) BY MOUTH DAILY 04/25/18   Aliene Beams, MD  metFORMIN (GLUCOPHAGE) 1000 MG tablet Take 1 tablet (1,000 mg total) by mouth 2 (two) times daily. 04/25/18   Aliene Beams, MD  methocarbamol (ROBAXIN) 500 MG tablet Take 1 tablet (500 mg total) by  mouth every 8 (eight) hours as needed for muscle spasms. Do not drink alcohol or drive while taking this medication.  May cause drowsiness. Patient not taking: Reported on 12/03/2022 07/16/22   Particia Nearing, PA-C  metoprolol succinate (TOPROL-XL) 25 MG 24 hr tablet Take 25 mg by mouth daily. 05/19/20   [provider]  montelukast (SINGULAIR) 10 MG tablet Take 1 tablet (10 mg total) by mouth at bedtime. 04/25/18   Aliene Beams, MD  naproxen (NAPROSYN) 500 MG tablet Take 1 tablet (500 mg total) by mouth 2 (two) times daily with a meal. Patient not taking: Reported on 12/03/2022 05/13/20   Darreld Mclean, MD  OZEMPIC, 0.25 OR 0.5 MG/DOSE, 2 MG/3ML SOPN SMARTSIG:0.25 Milligram(s) SUB-Q Once a Week 11/02/22   [provider]  promethazine-dextromethorphan (PROMETHAZINE-DM) 6.25-15 MG/5ML syrup Take 5 mLs by mouth 3 (three) times daily as needed for cough. Patient not taking: Reported on 12/03/2022 02/22/22   Wallis Bamberg, PA-C  pseudoephedrine (SUDAFED) 60 MG tablet Take 1 tablet (60 mg total) by mouth every 8 (eight) hours as needed for congestion. Patient not taking: Reported on 12/03/2022 02/22/22   Wallis Bamberg, PA-C  rosuvastatin (CRESTOR) 10 MG tablet Take 1 tablet (10 mg total) by mouth daily. 04/25/18   Aliene Beams, MD  Semaglutide-Weight Management (WEGOVY) 0.25 MG/0.5ML SOAJ Inject 0.25 mg into the skin. Patient not taking: Reported on 12/03/2022    [provider]  valACYclovir (VALTREX) 1000 MG tablet TAKE 1  TABLET BY MOUTH TWICE DAILY 09/02/21   Adline Potter, NP  Vitamin D, Ergocalciferol, (DRISDOL) 1.25 MG (50000 UNIT) CAPS capsule Take 50,000 Units by mouth once a week. 11/21/19   [provider]      Allergies    Mobic [meloxicam]    Review of Systems   Review of Systems  Physical Exam Updated Vital Signs BP 130/79   Pulse 81   Temp 99 F (37.2 C) (Oral)   Resp 18   Ht  (1.499 m)   Wt 104.3 kg   LMP 02/15/2022 (Approximate)    SpO2 100%   BMI 46.45 kg/m  Physical Exam Constitutional:      Appearance: She is well-developed.  HENT:     Head: Normocephalic and atraumatic.     Right Ear: Ear canal and external ear normal.     Left Ear: Ear canal and external ear normal.     Nose: No congestion.     Mouth/Throat:     Pharynx: Posterior oropharyngeal erythema present. No oropharyngeal exudate.     Comments: No intraoral swelling, redness, lesions.  No edema of the tongue. Eyes:     Conjunctiva/sclera: Conjunctivae normal.  Neck:     Comments: No significant swelling or edema.  No noticeable external swelling. Cardiovascular:     Rate and Rhythm: Normal rate.  Pulmonary:     Effort: Pulmonary effort is normal. No respiratory distress.  Abdominal:     General: There is no distension.     Tenderness: There is no abdominal tenderness. There is no guarding.  Musculoskeletal:        General: Normal range of motion.     Cervical back: Normal range of motion. Tenderness present. No rigidity.  Skin:    General: Skin is warm.     Findings: No rash.  Neurological:     General: No focal deficit present.     Mental Status: She is alert and oriented to person, place, and time.     Cranial Nerves: No cranial nerve deficit.     Motor: No weakness.     Gait: Gait normal.  Psychiatric:        Mood and Affect: Mood normal.        Behavior: Behavior normal.        Thought Content: Thought content normal.     ED Results / Procedures / Treatments   Labs (all labs ordered are listed, but only abnormal results are displayed) Labs Reviewed - No data to display  EKG None  Radiology No results found.  Procedures Procedures    Medications Ordered in ED Medications  methylPREDNISolone sodium succinate (SOLU-MEDROL) 125 mg/2 mL injection 125 mg (has no administration in time range)  sodium chloride 0.9 % bolus 1,000 mL (has no administration in time range)  diphenhydrAMINE (BENADRYL) injection 50 mg (has no  administration in time range)  famotidine (PEPCID) IVPB 20 mg premix (has no administration in time range)    ED Course/ Medical Decision Making/ A&P                             Medical Decision Making Risk Prescription drug management.   52 year old female presents with pharyngitis and feeling of her throat swelling since this morning.  She thought she had strep but after discussing with healthcare provider earlier today she was concerned about possible allergic reaction or angioedema from lisinopril.  Patient describes sore throat with  no other symptoms.  She had a low-grade temp of 99.  No abdominal pain, headache, chills or bodyaches.  She has no history anaphylaxis.  Patient was given IV methylprednisolone, Benadryl, famotidine and fluids.  She states she is 70% better and able to eat and drink well and has no chest pain or shortness of breath.  No rashes anywhere throughout her body.  She has no trismus.  Strep test negative, due to high prevalence of strep we will go ahead and place on Augmentin.  Will also place on a prednisone taper to treat for mild residual swelling/pain.  She appears well, stable vital signs are stable.  Will have her also hold her lisinopril until she follows up with PCP.  Patient understands signs and symptoms to return to the ER for. Final Clinical Impression(s) / ED Diagnoses Final diagnoses:  None    Rx / DC Orders ED Discharge Orders     None         Ronnette Juniper 12/03/22 1548    Chesley Noon, MD 12/03/22 1744

## 2022-12-03 NOTE — ED Provider Notes (Signed)
Susan [name]    CSN: [id number] Arrival date & time: 12/03/22  1046      History   Chief Complaint Chief Complaint  Patient presents with   Sore Throat    HPI Susan [name] is a 52 y.o. female.  Patient presents with sore throat since this morning.  Treatment attempted with allergy and cold medications.  No fever, rash, cough, difficulty breathing, vomiting, diarrhea, or other symptoms.  Her medical history includes morbid obesity, hypertension, diabetes.  The history is provided by the patient and medical records.    Past Medical History:  Diagnosis Date   Anxiety    Diabetes mellitus    Fluid retention    GERD (gastroesophageal reflux disease)    Heart murmur    Herpes simplex without mention of complication    History of herpes simplex infection 12/18/2013   Hyperlipidemia    Hypertension    Ovarian cyst    Reflux    Sciatica    SOB (shortness of breath)     Patient Active Problem List   Diagnosis Date Noted   Patellar subluxation, left, initial encounter 12/19/2019   Morbid obesity with body mass index (BMI) of 50.0 to 59.9 in adult 05/29/2018   OSA on CPAP 05/29/2018   Class 3 obesity with alveolar hypoventilation, serious comorbidity, and body mass index (BMI) of 50.0 to 59.9 in adult 01/12/2018   Sleep related headaches 01/12/2018   Intractable chronic cluster headache 01/12/2018   Excessive daytime sleepiness 01/12/2018   Sleeps in sitting position due to orthopnea 01/12/2018   Other fatigue 11/15/2017   Shortness of breath on exertion 11/15/2017   Type 2 diabetes mellitus without complication, without long-term current use of insulin 11/15/2017   Essential hypertension 11/15/2017   Other hyperlipidemia 11/15/2017   Allergic rhinitis 10/12/2017   DOE (dyspnea on exertion)    Chest pain 08/10/2017   Type 2 diabetes mellitus with hyperlipidemia 08/10/2017   Personal history of dysmenorrhea 03/10/2017   Chronic hypertension 02/21/2017    Dysmenorrhea 01/27/2017   Tendinitis of left rotator cuff 01/27/2015   History of herpes simplex infection 12/18/2013   Flank pain 09/21/2012   GERD (gastroesophageal reflux disease) 09/21/2012   Herpes genitalia 09/21/2012    Past Surgical History:  Procedure Laterality Date   DILATION AND CURETTAGE OF UTERUS     ENDOMETRIAL ABLATION     TUBAL LIGATION  2006    OB History     Gravida  3   Para  2   Term  2   Preterm      AB  1   Living  2      SAB  1   IAB      Ectopic      Multiple      Live Births  2            Home Medications    Prior to Admission medications   Medication Sig Start Date End Date Taking? Authorizing Provider  lidocaine (XYLOCAINE) 2 % solution Use as directed 15 mLs in the mouth or throat as needed for mouth pain. 12/03/22  Yes Mickie Bail, NP  OZEMPIC, 0.25 OR 0.5 MG/DOSE, 2 MG/3ML SOPN SMARTSIG:0.25 Milligram(s) SUB-Q Once a Week 11/02/22  Yes [provider]  albuterol (VENTOLIN HFA) 108 (90 Base) MCG/ACT inhaler Inhale 1-2 puffs into the lungs every 6 (six) hours as needed for wheezing or shortness of breath. Patient not taking: Reported on 12/03/2022 01/22/20  Wurst, [place], PA-C  amoxicillin (AMOXIL) 875 MG tablet Take 1 tablet (875 mg total) by mouth 2 (two) times daily. Patient not taking: Reported on 12/03/2022 02/22/22   Wallis Bamberg, PA-C  cetirizine (ZYRTEC ALLERGY) 10 MG tablet Take 1 tablet (10 mg total) by mouth daily. 02/22/22   Wallis Bamberg, PA-C  cyclobenzaprine (FLEXERIL) 10 MG tablet Take 10 mg by mouth at bedtime as needed. Patient not taking: Reported on 12/03/2022 05/21/20   [provider]  lisinopril (PRINIVIL,ZESTRIL) 10 MG tablet TAKE 1 TABLET(10 MG) BY MOUTH DAILY 04/25/18   Aliene Beams, MD  metFORMIN (GLUCOPHAGE) 1000 MG tablet Take 1 tablet (1,000 mg total) by mouth 2 (two) times daily. 04/25/18   Aliene Beams, MD  methocarbamol (ROBAXIN) 500 MG tablet Take 1 tablet (500 mg total) by mouth  every 8 (eight) hours as needed for muscle spasms. Do not drink alcohol or drive while taking this medication.  May cause drowsiness. Patient not taking: Reported on 12/03/2022 07/16/22   Particia Nearing, PA-C  metoprolol succinate (TOPROL-XL) 25 MG 24 hr tablet Take 25 mg by mouth daily. 05/19/20   [provider]  montelukast (SINGULAIR) 10 MG tablet Take 1 tablet (10 mg total) by mouth at bedtime. 04/25/18   Aliene Beams, MD  naproxen (NAPROSYN) 500 MG tablet Take 1 tablet (500 mg total) by mouth 2 (two) times daily with a meal. Patient not taking: Reported on 12/03/2022 05/13/20   Darreld Mclean, MD  promethazine-dextromethorphan (PROMETHAZINE-DM) 6.25-15 MG/5ML syrup Take 5 mLs by mouth 3 (three) times daily as needed for cough. Patient not taking: Reported on 12/03/2022 02/22/22   Wallis Bamberg, PA-C  pseudoephedrine (SUDAFED) 60 MG tablet Take 1 tablet (60 mg total) by mouth every 8 (eight) hours as needed for congestion. Patient not taking: Reported on 12/03/2022 02/22/22   Wallis Bamberg, PA-C  rosuvastatin (CRESTOR) 10 MG tablet Take 1 tablet (10 mg total) by mouth daily. 04/25/18   Aliene Beams, MD  Semaglutide-Weight Management (WEGOVY) 0.25 MG/0.5ML SOAJ Inject 0.25 mg into the skin. Patient not taking: Reported on 12/03/2022    [provider]  valACYclovir (VALTREX) 1000 MG tablet TAKE 1 TABLET BY MOUTH TWICE DAILY 09/02/21   Adline Potter, NP  Vitamin D, Ergocalciferol, (DRISDOL) 1.25 MG (50000 UNIT) CAPS capsule Take 50,000 Units by mouth once a week. 11/21/19   [provider]    Family History Family History  Problem Relation Age of Onset   Hypertension Maternal Grandmother    Breast cancer Other    Diabetes Mother    High blood pressure Mother    High Cholesterol Mother    Diabetes Father    High blood pressure Father    High Cholesterol Father    Alcoholism Father    Obesity Father    Hypertension Brother    Hyperlipidemia Brother    Lung  cancer Paternal Grandmother    Colon cancer Maternal Grandfather     Social History Social History   Tobacco Use   Smoking status: Never   Smokeless tobacco: Never  Vaping Use   Vaping Use: Never used  Substance Use Topics   Alcohol use: Yes    Comment: occ. 2 drinks/month   Drug use: No     Allergies   Mobic [meloxicam]   Review of Systems Review of Systems  Constitutional:  Negative for chills and fever.  HENT:  Positive for sore throat. Negative for ear pain.   Respiratory:  Negative for cough and shortness  of breath.   Cardiovascular:  Negative for chest pain and palpitations.  Skin:  Negative for rash.  All other systems reviewed and are negative.    Physical Exam Triage Vital Signs ED Triage Vitals  Enc Vitals Group     BP 12/03/22 1235 139/85     Pulse Rate 12/03/22 1218 94     Resp 12/03/22 1218 18     Temp 12/03/22 1218 98.2 F (36.8 C)     Temp src --      SpO2 12/03/22 1218 96 %     Weight --      Height --      Head Circumference --      Peak Flow --      Pain Score 12/03/22 1232 6     Pain Loc --      Pain Edu? --      Excl. in GC? --    No data found.  Updated Vital Signs BP 139/85   Pulse 94   Temp 98.2 F (36.8 C)   Resp 18   LMP 02/15/2022 (Approximate)   SpO2 96%   Visual Acuity Right Eye Distance:   Left Eye Distance:   Bilateral Distance:    Right Eye Near:   Left Eye Near:    Bilateral Near:     Physical Exam Vitals and nursing note reviewed.  Constitutional:      General: She is not in acute distress.    Appearance: She is well-developed. She is not ill-appearing.  HENT:     Head: Normocephalic and atraumatic.     Right Ear: Tympanic membrane normal.     Left Ear: Tympanic membrane normal.     Nose: Nose normal.     Mouth/Throat:     Mouth: Mucous membranes are moist.     Pharynx: Oropharynx is clear.  Cardiovascular:     Rate and Rhythm: Normal rate and regular rhythm.     Heart sounds: Normal heart  sounds.  Pulmonary:     Effort: Pulmonary effort is normal. No respiratory distress.     Breath sounds: Normal breath sounds.  Musculoskeletal:     Cervical back: Neck supple.  Skin:    General: Skin is warm and dry.  Neurological:     Mental Status: She is alert.  Psychiatric:        Mood and Affect: Mood normal.        Behavior: Behavior normal.      UC Treatments / Results  Labs (all labs ordered are listed, but only abnormal results are displayed) Labs Reviewed  POCT RAPID STREP A (OFFICE)    EKG   Radiology No results found.  Procedures Procedures (including critical care time)  Medications Ordered in UC Medications - No data to display  Initial Impression / Assessment and Plan / UC Course  I have reviewed the triage vital signs and the nursing notes.  Pertinent labs & imaging results that were available during my care of the patient were reviewed by me and considered in my medical decision making (see chart for details).    Sore throat.  Rapid strep negative.  Treating with viscous lidocaine (gargle and spit).  Tylenol or ibuprofen as needed.  Instructed patient to follow up with her PCP if her symptoms are not improving.  Education provided on sore throat.  She agrees to plan of care.    Final Clinical Impressions(s) / UC Diagnoses   Final diagnoses:  Sore throat  Discharge Instructions      Use the viscous lidocaine as directed (gargle and spit).    Your strep test is negative.    Follow up with your primary care provider if your symptoms are not improving.        ED Prescriptions     Medication Sig Dispense Auth. Provider   lidocaine (XYLOCAINE) 2 % solution Use as directed 15 mLs in the mouth or throat as needed for mouth pain. 100 mL Mickie Bail, NP      PDMP not reviewed this encounter.   Mickie Bail, NP 12/03/22 1259

## 2022-12-03 NOTE — Discharge Instructions (Addendum)
Use the viscous lidocaine as directed (gargle and spit).    Your strep test is negative.    Follow up with your primary care provider if your symptoms are not improving.

## 2022-12-03 NOTE — Discharge Instructions (Addendum)
Please take medications as prescribed and return for any fevers, difficulty swallowing, shortness of breath, worsening symptoms or any urgent changes in your health.  Please discontinue lisinopril until you discuss with

## 2022-12-03 NOTE — ED Notes (Addendum)
Pt states that Susan [name], [name] at the cancer center stated her throat was closing up. Pt states the swelling in her neck is worse since she has been here and that her voice is now hoarse. MD & NP made aware

## 2022-12-09 DIAGNOSIS — I10 Essential (primary) hypertension: Secondary | ICD-10-CM | POA: Diagnosis not present

## 2022-12-09 DIAGNOSIS — E1169 Type 2 diabetes mellitus with other specified complication: Secondary | ICD-10-CM | POA: Diagnosis not present

## 2023-03-04 DIAGNOSIS — E1169 Type 2 diabetes mellitus with other specified complication: Secondary | ICD-10-CM | POA: Diagnosis not present

## 2023-03-04 DIAGNOSIS — G4733 Obstructive sleep apnea (adult) (pediatric): Secondary | ICD-10-CM | POA: Diagnosis not present

## 2023-03-04 DIAGNOSIS — I10 Essential (primary) hypertension: Secondary | ICD-10-CM | POA: Diagnosis not present

## 2023-03-04 DIAGNOSIS — E782 Mixed hyperlipidemia: Secondary | ICD-10-CM | POA: Diagnosis not present

## 2023-03-07 ENCOUNTER — Other Ambulatory Visit: Payer: Self-pay | Admitting: Family Medicine

## 2023-03-07 DIAGNOSIS — Z1231 Encounter for screening mammogram for malignant neoplasm of breast: Secondary | ICD-10-CM

## 2023-03-16 ENCOUNTER — Inpatient Hospital Stay
Admission: RE | Admit: 2023-03-16 | Discharge: 2023-03-16 | Disposition: A | Discharge status: Home / Self Care | Payer: Self-pay | Source: Ambulatory Visit | Attending: Family Medicine | Admitting: Family Medicine

## 2023-03-16 ENCOUNTER — Other Ambulatory Visit: Payer: Self-pay | Admitting: *Deleted

## 2023-03-16 ENCOUNTER — Ambulatory Visit
Admission: RE | Admit: 2023-03-16 | Discharge: 2023-03-16 | Disposition: A | Discharge status: Home / Self Care | Payer: BC Managed Care – PPO | Source: Ambulatory Visit | Attending: Family Medicine | Admitting: Family Medicine

## 2023-03-16 DIAGNOSIS — Z1231 Encounter for screening mammogram for malignant neoplasm of breast: Secondary | ICD-10-CM

## 2023-03-17 DIAGNOSIS — I10 Essential (primary) hypertension: Secondary | ICD-10-CM | POA: Diagnosis not present

## 2023-03-17 DIAGNOSIS — G4733 Obstructive sleep apnea (adult) (pediatric): Secondary | ICD-10-CM | POA: Diagnosis not present

## 2023-04-25 DIAGNOSIS — R1084 Generalized abdominal pain: Secondary | ICD-10-CM | POA: Diagnosis not present

## 2023-04-25 DIAGNOSIS — R198 Other specified symptoms and signs involving the digestive system and abdomen: Secondary | ICD-10-CM | POA: Diagnosis not present

## 2023-04-26 ENCOUNTER — Other Ambulatory Visit: Payer: Self-pay | Admitting: Family Medicine

## 2023-04-26 DIAGNOSIS — R1084 Generalized abdominal pain: Secondary | ICD-10-CM

## 2023-04-29 ENCOUNTER — Ambulatory Visit
Admission: RE | Admit: 2023-04-29 | Discharge: 2023-04-29 | Disposition: A | Discharge status: Home / Self Care | Payer: BC Managed Care – PPO | Source: Ambulatory Visit | Attending: Family Medicine | Admitting: Family Medicine

## 2023-04-29 DIAGNOSIS — R1084 Generalized abdominal pain: Secondary | ICD-10-CM

## 2023-05-05 DIAGNOSIS — R944 Abnormal results of kidney function studies: Secondary | ICD-10-CM | POA: Diagnosis not present

## 2023-05-16 ENCOUNTER — Ambulatory Visit
Admission: RE | Admit: 2023-05-16 | Discharge: 2023-05-16 | Disposition: A | Discharge status: Home / Self Care | Payer: BC Managed Care – PPO | Source: Ambulatory Visit | Attending: Family Medicine | Admitting: Family Medicine

## 2023-05-16 ENCOUNTER — Other Ambulatory Visit: Payer: Self-pay | Admitting: Gastroenterology

## 2023-05-16 VITALS — BP 113/68 | HR 89 | Temp 99.7°F | Resp 14

## 2023-05-16 DIAGNOSIS — K921 Melena: Secondary | ICD-10-CM | POA: Diagnosis not present

## 2023-05-16 DIAGNOSIS — M25552 Pain in left hip: Secondary | ICD-10-CM | POA: Diagnosis not present

## 2023-05-16 DIAGNOSIS — M545 Low back pain, unspecified: Secondary | ICD-10-CM | POA: Diagnosis not present

## 2023-05-16 DIAGNOSIS — M25551 Pain in right hip: Secondary | ICD-10-CM | POA: Diagnosis not present

## 2023-05-16 DIAGNOSIS — R109 Unspecified abdominal pain: Secondary | ICD-10-CM | POA: Diagnosis not present

## 2023-05-16 DIAGNOSIS — G8929 Other chronic pain: Secondary | ICD-10-CM | POA: Diagnosis not present

## 2023-05-16 DIAGNOSIS — K760 Fatty (change of) liver, not elsewhere classified: Secondary | ICD-10-CM | POA: Diagnosis not present

## 2023-05-16 MED ORDER — DEXAMETHASONE SODIUM PHOSPHATE 10 MG/ML IJ SOLN
10.0000 mg | Freq: Once | INTRAMUSCULAR | Status: AC
  Administered 2023-05-16: 10 mg via INTRAMUSCULAR

## 2023-05-16 NOTE — ED Provider Notes (Signed)
[facility]    CSN: [id number] Arrival date & time: 05/16/23  1000      History   Chief Complaint Chief Complaint  Patient presents with   Back Pain    Back and hips I may need a xray. My arthritis is acting up again. - Entered by patient    HPI Susan [name] is a 52 y.o. female.   Patient presenting today with diffuse low back pain and hip pain worse with movement for the past 2 weeks but is an ongoing issue from arthritis.  Denies injury to the area recently, leg weakness numbness or tingling, bowel or bladder incontinence, saddle anesthesias.  So far trying over-the-counter pain relievers and muscle relaxers with no relief.  Typically gets a steroid shot when this happens and feels better.    Past Medical History:  Diagnosis Date   Anxiety    Diabetes mellitus (HCC)    Fluid retention    GERD (gastroesophageal reflux disease)    Heart murmur    Herpes simplex without mention of complication    History of herpes simplex infection 12/18/2013   Hyperlipidemia    Hypertension    Ovarian cyst    Reflux    Sciatica    SOB (shortness of breath)     Patient Active Problem List   Diagnosis Date Noted   Patellar subluxation, left, initial encounter 12/19/2019   Morbid obesity with body mass index (BMI) of 50.0 to 59.9 in adult (HCC) 05/29/2018   OSA on CPAP 05/29/2018   Class 3 obesity with alveolar hypoventilation, serious comorbidity, and body mass index (BMI) of 50.0 to 59.9 in adult (HCC) 01/12/2018   Sleep related headaches 01/12/2018   Intractable chronic cluster headache 01/12/2018   Excessive daytime sleepiness 01/12/2018   Sleeps in sitting position due to orthopnea 01/12/2018   Other fatigue 11/15/2017   Shortness of breath on exertion 11/15/2017   Type 2 diabetes mellitus without complication, without long-term current use of insulin (HCC) 11/15/2017   Essential hypertension 11/15/2017   Other hyperlipidemia 11/15/2017   Allergic rhinitis  10/12/2017   DOE (dyspnea on exertion)    Chest pain 08/10/2017   Type 2 diabetes mellitus with hyperlipidemia (HCC) 08/10/2017   Personal history of dysmenorrhea 03/10/2017   Chronic hypertension 02/21/2017   Dysmenorrhea 01/27/2017   Tendinitis of left rotator cuff 01/27/2015   History of herpes simplex infection 12/18/2013   Flank pain 09/21/2012   GERD (gastroesophageal reflux disease) 09/21/2012   Herpes genitalia 09/21/2012    Past Surgical History:  Procedure Laterality Date   DILATION AND CURETTAGE OF UTERUS     ENDOMETRIAL ABLATION     TUBAL LIGATION  2006    OB History     Gravida  3   Para  2   Term  2   Preterm      AB  1   Living  2      SAB  1   IAB      Ectopic      Multiple      Live Births  2            Home Medications    Prior to Admission medications   Medication Sig Start Date End Date Taking? Authorizing Provider  albuterol (VENTOLIN HFA) 108 (90 Base) MCG/ACT inhaler Inhale 1-2 puffs into the lungs every 6 (six) hours as needed for wheezing or shortness of breath. Patient not taking: Reported on 12/03/2022 01/22/20   Wurst,  [place], PA-C  amoxicillin (AMOXIL) 875 MG tablet Take 1 tablet (875 mg total) by mouth 2 (two) times daily. Patient not taking: Reported on 12/03/2022 02/22/22   Wallis Bamberg, PA-C  cetirizine (ZYRTEC ALLERGY) 10 MG tablet Take 1 tablet (10 mg total) by mouth daily. 02/22/22   Wallis Bamberg, PA-C  cyclobenzaprine (FLEXERIL) 10 MG tablet Take 10 mg by mouth at bedtime as needed. Patient not taking: Reported on 12/03/2022 05/21/20   [provider]  lidocaine (XYLOCAINE) 2 % solution Use as directed 15 mLs in the mouth or throat as needed for mouth pain. 12/03/22   Mickie Bail, NP  lisinopril (PRINIVIL,ZESTRIL) 10 MG tablet TAKE 1 TABLET(10 MG) BY MOUTH DAILY 04/25/18   Aliene Beams, MD  metFORMIN (GLUCOPHAGE) 1000 MG tablet Take 1 tablet (1,000 mg total) by mouth 2 (two) times daily. 04/25/18   Aliene Beams, MD  methocarbamol (ROBAXIN) 500 MG tablet Take 1 tablet (500 mg total) by mouth every 8 (eight) hours as needed for muscle spasms. Do not drink alcohol or drive while taking this medication.  May cause drowsiness. Patient not taking: Reported on 12/03/2022 07/16/22   Particia Nearing, PA-C  metoprolol succinate (TOPROL-XL) 25 MG 24 hr tablet Take 25 mg by mouth daily. 05/19/20   [provider]  montelukast (SINGULAIR) 10 MG tablet Take 1 tablet (10 mg total) by mouth at bedtime. 04/25/18   Aliene Beams, MD  naproxen (NAPROSYN) 500 MG tablet Take 1 tablet (500 mg total) by mouth 2 (two) times daily with a meal. Patient not taking: Reported on 12/03/2022 05/13/20   Darreld Mclean, MD  OZEMPIC, 0.25 OR 0.5 MG/DOSE, 2 MG/3ML SOPN SMARTSIG:0.25 Milligram(s) SUB-Q Once a Week 11/02/22   [provider]  predniSONE (DELTASONE) 10 MG tablet Take 1 tablet (10 mg total) by mouth daily. 6,5,4,3,2,1 six day taper 12/03/22   Evon Slack, PA-C  promethazine-dextromethorphan (PROMETHAZINE-DM) 6.25-15 MG/5ML syrup Take 5 mLs by mouth 3 (three) times daily as needed for cough. Patient not taking: Reported on 12/03/2022 02/22/22   Wallis Bamberg, PA-C  pseudoephedrine (SUDAFED) 60 MG tablet Take 1 tablet (60 mg total) by mouth every 8 (eight) hours as needed for congestion. Patient not taking: Reported on 12/03/2022 02/22/22   Wallis Bamberg, PA-C  rosuvastatin (CRESTOR) 10 MG tablet Take 1 tablet (10 mg total) by mouth daily. 04/25/18   Aliene Beams, MD  Semaglutide-Weight Management (WEGOVY) 0.25 MG/0.5ML SOAJ Inject 0.25 mg into the skin. Patient not taking: Reported on 12/03/2022    [provider]  valACYclovir (VALTREX) 1000 MG tablet TAKE 1 TABLET BY MOUTH TWICE DAILY 09/02/21   Adline Potter, NP  Vitamin D, Ergocalciferol, (DRISDOL) 1.25 MG (50000 UNIT) CAPS capsule Take 50,000 Units by mouth once a week. 11/21/19   [provider]    Family History Family  History  Problem Relation Age of Onset   Hypertension Maternal Grandmother    Breast cancer Other    Diabetes Mother    High blood pressure Mother    High Cholesterol Mother    Diabetes Father    High blood pressure Father    High Cholesterol Father    Alcoholism Father    Obesity Father    Hypertension Brother    Hyperlipidemia Brother    Lung cancer Paternal Grandmother    Colon cancer Maternal Grandfather     Social History Social History   Tobacco Use   Smoking status: Never   Smokeless tobacco: Never  Vaping Use   Vaping status: Never Used  Substance Use Topics   Alcohol use: Yes    Comment: occ. 2 drinks/month   Drug use: No     Allergies   Mobic [meloxicam]   Review of Systems Review of Systems Per HPI  Physical Exam Triage Vital Signs ED Triage Vitals  Encounter Vitals Group     BP 05/16/23 1009 113/68     Systolic BP Percentile --      Diastolic BP Percentile --      Pulse Rate 05/16/23 1009 89     Resp 05/16/23 1009 14     Temp 05/16/23 1009 99.7 F (37.6 C)     Temp Source 05/16/23 1009 Oral     SpO2 05/16/23 1009 96 %     Weight --      Height --      Head Circumference --      Peak Flow --      Pain Score 05/16/23 1011 9     Pain Loc --      Pain Education --      Exclude from Growth Chart --    No data found.  Updated Vital Signs BP 113/68 (BP Location: Right Arm)   Pulse 89   Temp 99.7 F (37.6 C) (Oral)   Resp 14   LMP 02/15/2022 (Approximate)   SpO2 96%   Visual Acuity Right Eye Distance:   Left Eye Distance:   Bilateral Distance:    Right Eye Near:   Left Eye Near:    Bilateral Near:     Physical Exam Vitals and nursing note reviewed.  Constitutional:      Appearance: Normal appearance. She is not ill-appearing.  HENT:     Head: Atraumatic.  Eyes:     Extraocular Movements: Extraocular movements intact.     Conjunctiva/sclera: Conjunctivae normal.  Cardiovascular:     Rate and Rhythm: Normal rate and  regular rhythm.     Heart sounds: Normal heart sounds.  Pulmonary:     Effort: Pulmonary effort is normal.     Breath sounds: Normal breath sounds.  Musculoskeletal:        General: No swelling, tenderness or signs of injury. Normal range of motion.     Cervical back: Normal range of motion and neck supple.     Comments: Bilateral low back mildly tender to palpation diffusely.  No midline spinal tenderness to palpation.  Negative straight leg raise bilateral lower extremities.  Normal gait and range of motion.  Skin:    General: Skin is warm and dry.  Neurological:     Mental Status: She is alert and oriented to person, place, and time.     Comments: Bilateral lower extremities neurovascularly intact  Psychiatric:        Mood and Affect: Mood normal.        Thought Content: Thought content normal.        Judgment: Judgment normal.      UC Treatments / Results  Labs (all labs ordered are listed, but only abnormal results are displayed) Labs Reviewed - No data to display  EKG   Radiology No results found.  Procedures Procedures (including critical care time)  Medications Ordered in UC Medications  dexamethasone (DECADRON) injection 10 mg (has no administration in time range)    Initial Impression / Assessment and Plan / UC Course  I have reviewed the triage vital signs and the nursing notes.  Pertinent labs & imaging  results that were available during my care of the patient were reviewed by me and considered in my medical decision making (see chart for details).     Vitals and exam reassuring today with no red flag findings.  Treat with IM Decadron, continued over-the-counter pain relievers and muscle relaxers, supportive home care.  Return for worsening symptoms.  Final Clinical Impressions(s) / UC Diagnoses   Final diagnoses:  Chronic bilateral low back pain, unspecified whether sciatica present  Bilateral hip pain   Discharge Instructions   None    ED  Prescriptions   None    PDMP not reviewed this encounter.   Particia Nearing, [place] 05/16/23 1044

## 2023-05-16 NOTE — ED Triage Notes (Signed)
Pt c/o back and hip pain, hx of arthritis, and sciatica. stated on right side the radiated across back and down into the hips intermittent  2 weeks hasn't gotten better.

## 2023-05-18 ENCOUNTER — Ambulatory Visit (HOSPITAL_COMMUNITY)
Admission: RE | Admit: 2023-05-18 | Discharge: 2023-05-18 | Disposition: A | Discharge status: Home / Self Care | Payer: BC Managed Care – PPO | Attending: Gastroenterology | Admitting: Gastroenterology

## 2023-05-18 ENCOUNTER — Other Ambulatory Visit: Payer: Self-pay

## 2023-05-18 ENCOUNTER — Ambulatory Visit (HOSPITAL_COMMUNITY): Payer: BC Managed Care – PPO | Admitting: Certified Registered Nurse Anesthetist

## 2023-05-18 ENCOUNTER — Encounter (HOSPITAL_COMMUNITY)
Admission: RE | Disposition: A | Discharge status: Home / Self Care | Payer: Self-pay | Source: Home / Self Care | Attending: Gastroenterology

## 2023-05-18 ENCOUNTER — Encounter (HOSPITAL_COMMUNITY): Payer: Self-pay | Admitting: Gastroenterology

## 2023-05-18 DIAGNOSIS — R519 Headache, unspecified: Secondary | ICD-10-CM | POA: Diagnosis not present

## 2023-05-18 DIAGNOSIS — K2970 Gastritis, unspecified, without bleeding: Secondary | ICD-10-CM | POA: Insufficient documentation

## 2023-05-18 DIAGNOSIS — Z7984 Long term (current) use of oral hypoglycemic drugs: Secondary | ICD-10-CM | POA: Insufficient documentation

## 2023-05-18 DIAGNOSIS — Z79899 Other long term (current) drug therapy: Secondary | ICD-10-CM | POA: Insufficient documentation

## 2023-05-18 DIAGNOSIS — I10 Essential (primary) hypertension: Secondary | ICD-10-CM | POA: Insufficient documentation

## 2023-05-18 DIAGNOSIS — G4730 Sleep apnea, unspecified: Secondary | ICD-10-CM | POA: Diagnosis not present

## 2023-05-18 DIAGNOSIS — Z7985 Long-term (current) use of injectable non-insulin antidiabetic drugs: Secondary | ICD-10-CM | POA: Insufficient documentation

## 2023-05-18 DIAGNOSIS — K648 Other hemorrhoids: Secondary | ICD-10-CM | POA: Diagnosis not present

## 2023-05-18 DIAGNOSIS — R1084 Generalized abdominal pain: Secondary | ICD-10-CM | POA: Insufficient documentation

## 2023-05-18 DIAGNOSIS — E119 Type 2 diabetes mellitus without complications: Secondary | ICD-10-CM | POA: Diagnosis not present

## 2023-05-18 DIAGNOSIS — E669 Obesity, unspecified: Secondary | ICD-10-CM | POA: Insufficient documentation

## 2023-05-18 DIAGNOSIS — K319 Disease of stomach and duodenum, unspecified: Secondary | ICD-10-CM | POA: Insufficient documentation

## 2023-05-18 DIAGNOSIS — F419 Anxiety disorder, unspecified: Secondary | ICD-10-CM | POA: Diagnosis not present

## 2023-05-18 DIAGNOSIS — Z6841 Body Mass Index (BMI) 40.0 and over, adult: Secondary | ICD-10-CM | POA: Insufficient documentation

## 2023-05-18 DIAGNOSIS — K2100 Gastro-esophageal reflux disease with esophagitis, without bleeding: Secondary | ICD-10-CM | POA: Diagnosis not present

## 2023-05-18 DIAGNOSIS — R109 Unspecified abdominal pain: Secondary | ICD-10-CM | POA: Diagnosis not present

## 2023-05-18 DIAGNOSIS — R1010 Upper abdominal pain, unspecified: Secondary | ICD-10-CM | POA: Diagnosis not present

## 2023-05-18 DIAGNOSIS — K921 Melena: Secondary | ICD-10-CM | POA: Insufficient documentation

## 2023-05-18 HISTORY — PX: COLONOSCOPY WITH PROPOFOL: SHX5780

## 2023-05-18 HISTORY — PX: ESOPHAGOGASTRODUODENOSCOPY (EGD) WITH PROPOFOL: SHX5813

## 2023-05-18 HISTORY — PX: BIOPSY: SHX5522

## 2023-05-18 LAB — GLUCOSE, CAPILLARY: Glucose-Capillary: 105 mg/dL — ABNORMAL HIGH (ref 70–99)

## 2023-05-18 SURGERY — ESOPHAGOGASTRODUODENOSCOPY (EGD) WITH PROPOFOL
Anesthesia: Monitor Anesthesia Care | Laterality: Bilateral

## 2023-05-18 MED ORDER — LIDOCAINE 2% (20 MG/ML) 5 ML SYRINGE
INTRAMUSCULAR | Status: DC | PRN
  Administered 2023-05-18: 100 mg via INTRAVENOUS

## 2023-05-18 MED ORDER — SODIUM CHLORIDE 0.9 % IV SOLN: INTRAVENOUS | Status: DC

## 2023-05-18 MED ORDER — ONDANSETRON HCL 4 MG/2ML IJ SOLN
INTRAMUSCULAR | Status: DC | PRN
  Administered 2023-05-18: 4 mg via INTRAVENOUS

## 2023-05-18 MED ORDER — PROPOFOL 500 MG/50ML IV EMUL
INTRAVENOUS | Status: DC | PRN
  Administered 2023-05-18: 150 ug/kg/min via INTRAVENOUS

## 2023-05-18 MED ORDER — GLYCOPYRROLATE PF 0.2 MG/ML IJ SOSY
PREFILLED_SYRINGE | INTRAMUSCULAR | Status: DC | PRN
  Administered 2023-05-18 (×2): .1 mg via INTRAVENOUS

## 2023-05-18 MED ORDER — LACTATED RINGERS IV SOLN
INTRAVENOUS | Status: AC | PRN
Start: 2023-05-18 — End: 2023-05-18
  Administered 2023-05-18: 1000 mL via INTRAVENOUS

## 2023-05-18 MED ORDER — PROPOFOL 1000 MG/100ML IV EMUL
INTRAVENOUS | Status: AC
  Filled 2023-05-18: qty 100

## 2023-05-18 MED ORDER — PROPOFOL 10 MG/ML IV BOLUS
INTRAVENOUS | Status: DC | PRN
  Administered 2023-05-18 (×4): 20 mg via INTRAVENOUS
  Administered 2023-05-18: 50 mg via INTRAVENOUS
  Administered 2023-05-18: 30 mg via INTRAVENOUS

## 2023-05-18 SURGICAL SUPPLY — 25 items

## 2023-05-18 NOTE — Anesthesia Preprocedure Evaluation (Signed)
Anesthesia Evaluation    Reviewed: Allergy & Precautions, Patient's Chart, lab work & pertinent test results  Airway Mallampati: III  TM Distance: >3 FB Neck ROM: Full    Dental no notable dental hx.    Pulmonary sleep apnea and Continuous Positive Airway Pressure Ventilation    Pulmonary exam normal        Cardiovascular hypertension, Pt. on home beta blockers Normal cardiovascular exam     Neuro/Psych  Headaches  Anxiety      Neuromuscular disease    GI/Hepatic Neg liver ROS, Bowel prep,,,  Endo/Other  diabetes, Oral Hypoglycemic Agents  Morbid obesityPatient on GLP-1 Agonist  Renal/GU negative Renal ROS     Musculoskeletal negative musculoskeletal ROS (+)    Abdominal  (+) + obese  Peds  Hematology negative hematology ROS (+)   Anesthesia Other Findings abdominal pain blood in stool  Reproductive/Obstetrics S/p BTL                             Anesthesia Physical Anesthesia Plan  ASA: 3  Anesthesia Plan: MAC   Post-op Pain Management:    Induction: Intravenous  PONV Risk Score and Plan: 2 and Propofol infusion and Treatment may vary due to age or medical condition  Airway Management Planned: Nasal Cannula  Additional Equipment:   Intra-op Plan:   Post-operative Plan:   Informed Consent: I have reviewed the patients History and Physical, chart, labs and discussed the procedure including the risks, benefits and alternatives for the proposed anesthesia with the patient or authorized representative who has indicated his/her understanding and acceptance.     Dental advisory given  Plan Discussed with: CRNA  Anesthesia Plan Comments:        Anesthesia Quick Evaluation

## 2023-05-18 NOTE — Op Note (Signed)
[facility] Patient Name: Susan [name] Procedure Date: 05/18/2023 MRN: [id number] Attending MD: Willis Modena , MD, [id number] Date of Birth: 07-11-1971 CSN: [id number] Age: 52 Admit Type: Outpatient Procedure:                Upper GI endoscopy Indications:              Upper abdominal pain, Generalized abdominal pain Providers:                Willis Modena, MD, Stephens Shire RN, RN, Suzy Bouchard, RN, Kandice Robinsons, Technician Referring MD:             Willis Modena, MD Medicines:                Monitored Anesthesia Care Complications:            No immediate complications. Estimated Blood Loss:     Estimated blood loss: none. Estimated blood loss:                            none. Procedure:                Pre-Anesthesia Assessment:                           - Prior to the procedure, a History and Physical                            was performed, and patient medications and                            allergies were reviewed. The patient's tolerance of                            previous anesthesia was also reviewed. The risks                            and benefits of the procedure and the sedation                            options and risks were discussed with the patient.                            All questions were answered, and informed consent                            was obtained. Prior Anticoagulants: The patient has                            taken no anticoagulant or antiplatelet agents. ASA                            Grade Assessment: III - A patient with severe  systemic disease. After reviewing the risks and                            benefits, the patient was deemed in satisfactory                            condition to undergo the procedure.                           After obtaining informed consent, the endoscope was                            passed under direct vision. Throughout the                             procedure, the patient's blood pressure, pulse, and                            oxygen saturations were monitored continuously. The                            [id number]-[id number] ([id number]) Olympus endoscope was introduced                            through the mouth, and advanced to the second part                            of duodenum. The upper GI endoscopy was                            accomplished without difficulty. The patient                            tolerated the procedure well. Scope In: Scope Out: Findings:      LA Grade A (one or more mucosal breaks less than 5 mm, not extending       between tops of 2 mucosal folds) esophagitis with no bleeding was found.      The exam of the esophagus was otherwise normal.      Patchy mild inflammation characterized by linear erosions was found in       the gastric body and in the gastric antrum. Biopsies were taken with a       cold forceps for histology.      The exam of the stomach was otherwise normal.      The duodenal bulb, first portion of the duodenum and second portion of       the duodenum were normal. Impression:               - LA Grade A reflux esophagitis with no bleeding.                           - Gastritis. Biopsied.                           - Normal duodenal bulb, first portion of the  duodenum and second portion of the duodenum. Moderate Sedation:      Not Applicable - Patient had care per Anesthesia. Recommendation:           - Await pathology results.                           - Perform a colonoscopy today. Procedure Code(s):        --- Professional ---                           [id number], Esophagogastroduodenoscopy, flexible,                            transoral; with biopsy, single or multiple Diagnosis Code(s):        --- Professional ---                           K21.00, Gastro-esophageal reflux disease with                            esophagitis, without bleeding                            K29.70, Gastritis, unspecified, without bleeding                           R10.10, Upper abdominal pain, unspecified                           R10.84, Generalized abdominal pain CPT copyright 2022 [employer]. All rights reserved. The codes documented in this report are preliminary and upon coder review may  be revised to meet current compliance requirements. Willis Modena, MD 05/18/2023 10:16:31 AM This report has been signed electronically. Number of Addenda: 0

## 2023-05-18 NOTE — Anesthesia Procedure Notes (Signed)
Procedure Name: MAC Date/Time: 05/18/2023 9:37 AM  Performed by: Ludwig Lean, CRNAPre-anesthesia Checklist: Patient identified, Emergency Drugs available, Suction available and Patient being monitored Patient Re-evaluated:Patient Re-evaluated prior to induction Oxygen Delivery Method: Simple face mask Placement Confirmation: positive ETCO2 and breath sounds checked- equal and bilateral

## 2023-05-18 NOTE — H&P (Signed)
[facility] H/P Note  Chief Complaint: abdominal pain, blood in stool  HPI: Susan [name] is an 52 y.o. female.  Abdominal pain (migratory) and blood in stool.  No prior endoscopy or colonoscopy.  GERD, on prilosec otc (not controlled).  Past Medical History:  Diagnosis Date   Anxiety    Diabetes mellitus (HCC)    Fluid retention    GERD (gastroesophageal reflux disease)    Heart murmur    Herpes simplex without mention of complication    History of herpes simplex infection 12/18/2013   Hyperlipidemia    Hypertension    Ovarian cyst    Reflux    Sciatica    SOB (shortness of breath)     Past Surgical History:  Procedure Laterality Date   DILATION AND CURETTAGE OF UTERUS     ENDOMETRIAL ABLATION     TUBAL LIGATION  2006    Medications Prior to Admission  Medication Sig Dispense Refill   cetirizine (ZYRTEC ALLERGY) 10 MG tablet Take 1 tablet (10 mg total) by mouth daily. 30 tablet 0   losartan (COZAAR) 25 MG tablet Take 25 mg by mouth daily.     metFORMIN (GLUCOPHAGE) 1000 MG tablet Take 1 tablet (1,000 mg total) by mouth 2 (two) times daily. 180 tablet 1   metoprolol succinate (TOPROL-XL) 25 MG 24 hr tablet Take 25 mg by mouth daily.     montelukast (SINGULAIR) 10 MG tablet Take 1 tablet (10 mg total) by mouth at bedtime. 90 tablet 1   predniSONE (DELTASONE) 10 MG tablet Take 1 tablet (10 mg total) by mouth daily. 6,5,4,3,2,1 six day taper 21 tablet 0   rosuvastatin (CRESTOR) 10 MG tablet Take 1 tablet (10 mg total) by mouth daily. 90 tablet 1   Vitamin D, Ergocalciferol, (DRISDOL) 1.25 MG (50000 UNIT) CAPS capsule Take 50,000 Units by mouth once a week.     albuterol (VENTOLIN HFA) 108 (90 Base) MCG/ACT inhaler Inhale 1-2 puffs into the lungs every 6 (six) hours as needed for wheezing or shortness of breath. (Patient not taking: Reported on 12/03/2022) 18 g 0   amoxicillin (AMOXIL) 875 MG tablet Take 1 tablet (875 mg total) by mouth 2 (two) times daily. (Patient not  taking: Reported on 12/03/2022) 14 tablet 0   cyclobenzaprine (FLEXERIL) 10 MG tablet Take 10 mg by mouth at bedtime as needed. (Patient not taking: Reported on 12/03/2022)     lidocaine (XYLOCAINE) 2 % solution Use as directed 15 mLs in the mouth or throat as needed for mouth pain. 100 mL 0   lisinopril (PRINIVIL,ZESTRIL) 10 MG tablet TAKE 1 TABLET(10 MG) BY MOUTH DAILY 90 tablet 1   methocarbamol (ROBAXIN) 500 MG tablet Take 1 tablet (500 mg total) by mouth every 8 (eight) hours as needed for muscle spasms. Do not drink alcohol or drive while taking this medication.  May cause drowsiness. (Patient not taking: Reported on 12/03/2022) 15 tablet 0   naproxen (NAPROSYN) 500 MG tablet Take 1 tablet (500 mg total) by mouth 2 (two) times daily with a meal. (Patient not taking: Reported on 12/03/2022) 60 tablet 5   OZEMPIC, 0.25 OR 0.5 MG/DOSE, 2 MG/3ML SOPN SMARTSIG:0.25 Milligram(s) SUB-Q Once a Week     promethazine-dextromethorphan (PROMETHAZINE-DM) 6.25-15 MG/5ML syrup Take 5 mLs by mouth 3 (three) times daily as needed for cough. (Patient not taking: Reported on 12/03/2022) 100 mL 0   pseudoephedrine (SUDAFED) 60 MG tablet Take 1 tablet (60 mg total) by mouth every 8 (eight) hours  as needed for congestion. (Patient not taking: Reported on 12/03/2022) 30 tablet 0   Semaglutide-Weight Management (WEGOVY) 0.25 MG/0.5ML SOAJ Inject 0.25 mg into the skin. (Patient not taking: Reported on 12/03/2022)     valACYclovir (VALTREX) 1000 MG tablet TAKE 1 TABLET BY MOUTH TWICE DAILY 60 tablet 3    Allergies:  Allergies  Allergen Reactions   Mobic [Meloxicam]     Make her dizzy    Family History  Problem Relation Age of Onset   Hypertension Maternal Grandmother    Breast cancer Other    Diabetes Mother    High blood pressure Mother    High Cholesterol Mother    Diabetes Father    High blood pressure Father    High Cholesterol Father    Alcoholism Father    Obesity Father    Hypertension Brother     Hyperlipidemia Brother    Lung cancer Paternal Grandmother    Colon cancer Maternal Grandfather     Social History:  reports that she has never smoked. She has never used smokeless tobacco. She reports current alcohol use. She reports that she does not use drugs.   ROS: As per HPI, all others negative   Blood pressure (!) 167/89, pulse 83, temperature 98.4 F (36.9 C), temperature source Tympanic, resp. rate 18, height 4\' 11"  (1.499 m), weight 115.7 kg, last menstrual period 02/15/2022, SpO2 100%. General appearance: Overweight, NAD NECK:  Thick, supple HEENT:  [place]/AT, anicteric CV:  Regular RESP:  No respiratory distress ABD:  Soft, non-tender NEURO:  No encephalopathy  Results for orders placed or performed during the hospital encounter of 05/18/23 (from the past 48 hour(s))  Glucose, capillary     Status: Abnormal   Collection Time: 05/18/23  8:44 AM  Result Value Ref Range   Glucose-Capillary 105 (H) 70 - 99 mg/dL    Comment: Glucose reference range applies only to samples taken after fasting for at least 8 hours.   No results found.  Assessment/Plan   Abdominal pain. Blood in stool. Family history colon cancer (grandparent). Endoscopy and colonoscopy for further evaluation. Risks (bleeding, infection, bowel perforation that could require surgery, sedation-related changes in cardiopulmonary systems), benefits (identification and possible treatment of source of symptoms, exclusion of certain causes of symptoms), and alternatives (watchful waiting, radiographic imaging studies, empiric medical treatment) of upper endoscopy (EGD) were explained to patient/family in detail and patient wishes to proceed.  Risks (bleeding, infection, bowel perforation that could require surgery, sedation-related changes in cardiopulmonary systems), benefits (identification and possible treatment of source of symptoms, exclusion of certain causes of symptoms), and alternatives (watchful waiting,  radiographic imaging studies, empiric medical treatment) of colonoscopy were explained to patient/family in detail and patient wishes to proceed.   Freddy Jaksch 05/18/2023, 9:16 AM

## 2023-05-18 NOTE — Op Note (Signed)
[facility] Patient Name: Susan [name] Procedure Date: 05/18/2023 MRN: [id number] Attending MD: Willis Modena , MD, [id number] Date of Birth: Oct 15, 1970 CSN: [id number] Age: 52 Admit Type: Outpatient Procedure:                Colonoscopy Indications:              This is the patient's first colonoscopy,                            Hematochezia Providers:                Willis Modena, MD, Stephens Shire RN, RN, Suzy Bouchard, RN, Kandice Robinsons, Technician Referring MD:             Willis Modena, MD Medicines:                Monitored Anesthesia Care Complications:            No immediate complications. Estimated Blood Loss:     Estimated blood loss: none. Procedure:                Pre-Anesthesia Assessment:                           - Prior to the procedure, a History and Physical                            was performed, and patient medications and                            allergies were reviewed. The patient's tolerance of                            previous anesthesia was also reviewed. The risks                            and benefits of the procedure and the sedation                            options and risks were discussed with the patient.                            All questions were answered, and informed consent                            was obtained. Prior Anticoagulants: The patient has                            taken no anticoagulant or antiplatelet agents. ASA                            Grade Assessment: III - A patient with severe  systemic disease. After reviewing the risks and                            benefits, the patient was deemed in satisfactory                            condition to undergo the procedure.                           - Prior to the procedure, a History and Physical                            was performed, and patient medications and                            allergies  were reviewed. The patient's tolerance of                            previous anesthesia was also reviewed. The risks                            and benefits of the procedure and the sedation                            options and risks were discussed with the patient.                            All questions were answered, and informed consent                            was obtained. Prior Anticoagulants: The patient has                            taken no anticoagulant or antiplatelet agents. ASA                            Grade Assessment: III - A patient with severe                            systemic disease. After reviewing the risks and                            benefits, the patient was deemed in satisfactory                            condition to undergo the procedure.                           After obtaining informed consent, the colonoscope                            was passed under direct vision. Throughout the  procedure, the patient's blood pressure, pulse, and                            oxygen saturations were monitored continuously. The                            [id number] ([id number]) Olympus colonoscope was                            introduced through the anus and advanced to the the                            cecum, identified by appendiceal orifice and                            ileocecal valve. The ileocecal valve, appendiceal                            orifice, and rectum were photographed. The entire                            colon was examined. The colonoscopy was performed                            without difficulty. The patient tolerated the                            procedure well. The quality of the bowel                            preparation was good. Scope In: 9:57:23 AM Scope Out: 10:10:55 AM Scope Withdrawal Time: 0 hours 8 minutes 41 seconds  Total Procedure Duration: 0 hours 13 minutes 32 seconds  Findings:      The  perianal and digital rectal examinations were normal.      Internal hemorrhoids were found during retroflexion. The hemorrhoids       were mild.      The exam was otherwise without abnormality on direct and retroflexion       views. Impression:               - Internal hemorrhoids. Likely source of patient's                            sporadic hematochezia.                           - The examination was otherwise normal on direct                            and retroflexion views. Moderate Sedation:      Not Applicable - Patient had care per Anesthesia. Recommendation:           - Patient has a contact number available for  emergencies. The signs and symptoms of potential                            delayed complications were discussed with the                            patient. Return to normal activities tomorrow.                            Written discharge instructions were provided to the                            patient.                           - Resume previous diet today.                           - Continue present medications.                           - Repeat colonoscopy in 10 years for screening                            purposes.                           - Return to GI clinic after studies are complete.                           - Return to referring physician as previously                            scheduled. Procedure Code(s):        --- Professional ---                           [phone], Colonoscopy, flexible; diagnostic, including                            collection of specimen(s) by brushing or washing,                            when performed (separate procedure) Diagnosis Code(s):        --- Professional ---                           K64.8, Other hemorrhoids                           K92.1, Melena (includes Hematochezia) CPT copyright 2022 American Medical Association. All rights reserved. The codes documented in this report are  preliminary and upon coder review may  be revised to meet current compliance requirements. Willis Modena, MD 05/18/2023 10:18:41 AM This report has been signed electronically. Number of Addenda: 0

## 2023-05-18 NOTE — Transfer of Care (Signed)
Immediate Anesthesia Transfer of Care Note  Patient: Susan [name]  Procedure(s) Performed: Procedure(s): ESOPHAGOGASTRODUODENOSCOPY (EGD) WITH PROPOFOL (Bilateral) COLONOSCOPY WITH PROPOFOL (Bilateral) BIOPSY  Patient Location: PACU  Anesthesia Type:MAC  Level of Consciousness: Patient easily awoken,comfortable, cooperative, following commands, responds to stimulation.   Airway & Oxygen Therapy: Patient spontaneously breathing, ventilating well, oxygen via simple oxygen mask.  Post-op Assessment: Report given to PACU RN, vital signs reviewed and stable, moving all extremities.   Post vital signs: Reviewed and stable.  Complications: No apparent anesthesia complications  Last Vitals:  Vitals Value Taken Time  BP 121/45 1020 05/18/23  Temp    Pulse 108 1020 05/18/23  Resp 21 1020 05/18/23  SpO2 100% 1020 05/18/23    Last Pain:  Vitals:   05/18/23 0817  TempSrc: Tympanic  PainSc: 0-No pain         Complications: No notable events documented.

## 2023-05-18 NOTE — Discharge Instructions (Signed)
YOU HAD AN ENDOSCOPIC PROCEDURE TODAY: Refer to the procedure report and other information in the discharge instructions given to you for any specific questions about what was found during the examination. If this information does not answer your questions, please call [facility] office at [phone] to clarify.   YOU SHOULD EXPECT: Some feelings of bloating in the abdomen. Passage of more gas than usual. Walking can help get rid of the air that was put into your GI tract during the procedure and reduce the bloating. If you had a lower endoscopy (such as a colonoscopy or flexible sigmoidoscopy) you may notice spotting of blood in your stool or on the toilet paper. Some abdominal soreness may be present for a day or two, also.  DIET: Your first meal following the procedure should be a light meal and then it is ok to progress to your normal diet. A half-sandwich or bowl of soup is an example of a good first meal. Heavy or fried foods are harder to digest and may make you feel nauseous or bloated. Drink plenty of fluids but you should avoid alcoholic beverages for 24 hours. If you had a esophageal dilation, please see attached instructions for diet.    ACTIVITY: Your care partner should take you home directly after the procedure. You should plan to take it easy, moving slowly for the rest of the day. You can resume normal activity the day after the procedure however YOU SHOULD NOT DRIVE, use power tools, machinery or perform tasks that involve climbing or major physical exertion for 24 hours (because of the sedation medicines used during the test).   SYMPTOMS TO REPORT IMMEDIATELY: A gastroenterologist can be reached at any hour. Please call [phone]  for any of the following symptoms:  Following lower endoscopy (colonoscopy, flexible sigmoidoscopy) Excessive amounts of blood in the stool  Significant tenderness, worsening of abdominal pains  Swelling of the abdomen that is new, acute  Fever of 100  or higher  Following upper endoscopy (EGD, EUS, ERCP, esophageal dilation) Vomiting of blood or coffee ground material  New, significant abdominal pain  New, significant chest pain or pain under the shoulder blades  Painful or persistently difficult swallowing  New shortness of breath  Black, tarry-looking or red, bloody stools  FOLLOW UP:  If any biopsies were taken you will be contacted by phone or by letter within the next 1-3 weeks. Call [phone]  if you have not heard about the biopsies in 3 weeks.  Please also call with any specific questions about appointments or follow up tests. YOU HAD AN ENDOSCOPIC PROCEDURE TODAY: Refer to the procedure report and other information in the discharge instructions given to you for any specific questions about what was found during the examination. If this information does not answer your questions, please call [facility] office at [phone] to clarify. YOU HAD AN ENDOSCOPIC PROCEDURE TODAY: Refer to the procedure report and other information in the discharge instructions given to you for any specific questions about what was found during the examination. If this information does not answer your questions, please call [facility] office at [phone] to clarify.   YOU SHOULD EXPECT: Some feelings of bloating in the abdomen. Passage of more gas than usual. Walking can help get rid of the air that was put into your GI tract during the procedure and reduce the bloating. If you had a lower endoscopy (such as a colonoscopy or flexible sigmoidoscopy) you may notice spotting of blood in your stool  or on the toilet paper. Some abdominal soreness may be present for a day or two, also.  DIET: Your first meal following the procedure should be a light meal and then it is ok to progress to your normal diet. A half-sandwich or bowl of soup is an example of a good first meal. Heavy or fried foods are harder to digest and may make you feel nauseous or bloated. Drink plenty  of fluids but you should avoid alcoholic beverages for 24 hours. If you had a esophageal dilation, please see attached instructions for diet.    ACTIVITY: Your care partner should take you home directly after the procedure. You should plan to take it easy, moving slowly for the rest of the day. You can resume normal activity the day after the procedure however YOU SHOULD NOT DRIVE, use power tools, machinery or perform tasks that involve climbing or major physical exertion for 24 hours (because of the sedation medicines used during the test).   SYMPTOMS TO REPORT IMMEDIATELY: A gastroenterologist can be reached at any hour. Please call [phone]  for any of the following symptoms:  Following lower endoscopy (colonoscopy, flexible sigmoidoscopy) Excessive amounts of blood in the stool  Significant tenderness, worsening of abdominal pains  Swelling of the abdomen that is new, acute  Fever of 100 or higher  Following upper endoscopy (EGD, EUS, ERCP, esophageal dilation) Vomiting of blood or coffee ground material  New, significant abdominal pain  New, significant chest pain or pain under the shoulder blades  Painful or persistently difficult swallowing  New shortness of breath  Black, tarry-looking or red, bloody stools  FOLLOW UP:  If any biopsies were taken you will be contacted by phone or by letter within the next 1-3 weeks. Call [phone]  if you have not heard about the biopsies in 3 weeks.  Please also call with any specific questions about appointments or follow up tests. YOU HAD AN ENDOSCOPIC PROCEDURE TODAY: Refer to the procedure report and other information in the discharge instructions given to you for any specific questions about what was found during the examination. If this information does not answer your questions, please call [facility] office at [phone] to clarify.

## 2023-05-19 NOTE — Anesthesia Postprocedure Evaluation (Signed)
Anesthesia Post Note  Patient: Susan [name]  Procedure(s) Performed: ESOPHAGOGASTRODUODENOSCOPY (EGD) WITH PROPOFOL (Bilateral) COLONOSCOPY WITH PROPOFOL (Bilateral) BIOPSY     Patient location during evaluation: Endoscopy Anesthesia Type: MAC Level of consciousness: awake Pain management: pain level controlled Vital Signs Assessment: post-procedure vital signs reviewed and stable Respiratory status: spontaneous breathing, nonlabored ventilation and respiratory function stable Cardiovascular status: blood pressure returned to baseline and stable Postop Assessment: no apparent nausea or vomiting Anesthetic complications: no   No notable events documented.  Last Vitals:  Vitals:   05/18/23 1030 05/18/23 1040  BP: (!) 145/77 (!) 166/88  Pulse:  89  Resp:  15  Temp:    SpO2:  100%    Last Pain:  Vitals:   05/18/23 1040  TempSrc:   PainSc: 0-No pain                 Peggy Loge P Tamika Nou

## 2023-05-23 ENCOUNTER — Encounter (HOSPITAL_COMMUNITY): Payer: Self-pay | Admitting: Gastroenterology

## 2023-05-23 LAB — SURGICAL PATHOLOGY

## 2023-07-08 DIAGNOSIS — R109 Unspecified abdominal pain: Secondary | ICD-10-CM | POA: Diagnosis not present

## 2023-07-08 DIAGNOSIS — Z1211 Encounter for screening for malignant neoplasm of colon: Secondary | ICD-10-CM | POA: Diagnosis not present

## 2023-07-08 DIAGNOSIS — K921 Melena: Secondary | ICD-10-CM | POA: Diagnosis not present

## 2023-07-08 DIAGNOSIS — K219 Gastro-esophageal reflux disease without esophagitis: Secondary | ICD-10-CM | POA: Diagnosis not present

## 2023-07-13 ENCOUNTER — Other Ambulatory Visit: Payer: Self-pay | Admitting: Adult Health

## 2023-07-22 DIAGNOSIS — Z01419 Encounter for gynecological examination (general) (routine) without abnormal findings: Secondary | ICD-10-CM | POA: Diagnosis not present

## 2023-08-03 ENCOUNTER — Ambulatory Visit: Payer: BC Managed Care – PPO

## 2023-08-18 DIAGNOSIS — Z0000 Encounter for general adult medical examination without abnormal findings: Secondary | ICD-10-CM | POA: Diagnosis not present

## 2023-08-18 DIAGNOSIS — E782 Mixed hyperlipidemia: Secondary | ICD-10-CM | POA: Diagnosis not present

## 2023-08-18 DIAGNOSIS — Z23 Encounter for immunization: Secondary | ICD-10-CM | POA: Diagnosis not present

## 2023-08-18 DIAGNOSIS — E1169 Type 2 diabetes mellitus with other specified complication: Secondary | ICD-10-CM | POA: Diagnosis not present

## 2023-08-18 DIAGNOSIS — I10 Essential (primary) hypertension: Secondary | ICD-10-CM | POA: Diagnosis not present

## 2023-08-18 DIAGNOSIS — F419 Anxiety disorder, unspecified: Secondary | ICD-10-CM | POA: Diagnosis not present

## 2023-09-26 DIAGNOSIS — E1169 Type 2 diabetes mellitus with other specified complication: Secondary | ICD-10-CM | POA: Diagnosis not present

## 2023-09-29 ENCOUNTER — Other Ambulatory Visit: Payer: Self-pay

## 2023-09-29 ENCOUNTER — Ambulatory Visit
Admission: RE | Admit: 2023-09-29 | Discharge: 2023-09-29 | Disposition: A | Discharge status: Home / Self Care | Payer: BC Managed Care – PPO | Source: Ambulatory Visit | Attending: Emergency Medicine | Admitting: Emergency Medicine

## 2023-09-29 VITALS — BP 138/87 | HR 74 | Temp 98.7°F | Resp 18

## 2023-09-29 DIAGNOSIS — R1030 Lower abdominal pain, unspecified: Secondary | ICD-10-CM | POA: Diagnosis not present

## 2023-09-29 DIAGNOSIS — N898 Other specified noninflammatory disorders of vagina: Secondary | ICD-10-CM

## 2023-09-29 LAB — POCT URINALYSIS DIP (MANUAL ENTRY)
Bilirubin, UA: NEGATIVE
Blood, UA: NEGATIVE
Glucose, UA: NEGATIVE mg/dL
Ketones, POC UA: NEGATIVE mg/dL
Leukocytes, UA: NEGATIVE
Nitrite, UA: NEGATIVE
Protein Ur, POC: NEGATIVE mg/dL
Spec Grav, UA: 1.02
Urobilinogen, UA: 0.2 U/dL
pH, UA: 6.5

## 2023-09-29 NOTE — ED Triage Notes (Signed)
Vaginal discharge and lower abd discomfort for a week.

## 2023-09-29 NOTE — ED Provider Notes (Signed)
[name]    CSN: [id number] Arrival date & time: 09/29/23  1646      History   Chief Complaint Chief Complaint  Patient presents with   Abdominal Pain    Check for UTI and STDs - Entered by patient    HPI Susan [name] is a 53 y.o. female.  Patient presents with 1 week history of vaginal discharge and intermittent bilateral lower abdominal pain.  No fever, dysuria, hematuria, flank pain, pelvic pain, nausea, vomiting, diarrhea, constipation.  She took one Monistat vaginal suppository out of a 3 day course; she plans to finish the other two suppositories.    The history is provided by the patient and medical records.    Past Medical History:  Diagnosis Date   Anxiety    Diabetes mellitus (HCC)    Fluid retention    GERD (gastroesophageal reflux disease)    Heart murmur    Herpes simplex without mention of complication    History of herpes simplex infection 12/18/2013   Hyperlipidemia    Hypertension    Ovarian cyst    Reflux    Sciatica    SOB (shortness of breath)     Patient Active Problem List   Diagnosis Date Noted   Patellar subluxation, left, initial encounter 12/19/2019   Morbid obesity with body mass index (BMI) of 50.0 to 59.9 in adult (HCC) 05/29/2018   OSA on CPAP 05/29/2018   Class 3 obesity with alveolar hypoventilation, serious comorbidity, and body mass index (BMI) of 50.0 to 59.9 in adult (HCC) 01/12/2018   Sleep related headaches 01/12/2018   Intractable chronic cluster headache 01/12/2018   Excessive daytime sleepiness 01/12/2018   Sleeps in sitting position due to orthopnea 01/12/2018   Other fatigue 11/15/2017   Shortness of breath on exertion 11/15/2017   Type 2 diabetes mellitus without complication, without long-term current use of insulin (HCC) 11/15/2017   Essential hypertension 11/15/2017   Other hyperlipidemia 11/15/2017   Allergic rhinitis 10/12/2017   DOE (dyspnea on exertion)    Chest pain 08/10/2017   Type 2 diabetes  mellitus with hyperlipidemia (HCC) 08/10/2017   Personal history of dysmenorrhea 03/10/2017   Chronic hypertension 02/21/2017   Dysmenorrhea 01/27/2017   Tendinitis of left rotator cuff 01/27/2015   History of herpes simplex infection 12/18/2013   Flank pain 09/21/2012   GERD (gastroesophageal reflux disease) 09/21/2012   Herpes genitalia 09/21/2012    Past Surgical History:  Procedure Laterality Date   BIOPSY  05/18/2023   Procedure: BIOPSY;  Surgeon: Willis Modena, MD;  Location: [facility];  Service: Gastroenterology;;   COLONOSCOPY WITH PROPOFOL Bilateral 05/18/2023   Procedure: COLONOSCOPY WITH PROPOFOL;  Surgeon: Willis Modena, MD;  Location: [facility];  Service: Gastroenterology;  Laterality: Bilateral;   DILATION AND CURETTAGE OF UTERUS     ENDOMETRIAL ABLATION     ESOPHAGOGASTRODUODENOSCOPY (EGD) WITH PROPOFOL Bilateral 05/18/2023   Procedure: ESOPHAGOGASTRODUODENOSCOPY (EGD) WITH PROPOFOL;  Surgeon: Willis Modena, MD;  Location: [facility];  Service: Gastroenterology;  Laterality: Bilateral;   TUBAL LIGATION  2006    OB History     Gravida  3   Para  2   Term  2   Preterm      AB  1   Living  2      SAB  1   IAB      Ectopic      Multiple      Live Births  2  Home Medications    Prior to Admission medications   Medication Sig Start Date End Date Taking? Authorizing Provider  cetirizine (ZYRTEC ALLERGY) 10 MG tablet Take 1 tablet (10 mg total) by mouth daily. 02/22/22   Wallis Bamberg, PA-C  lidocaine (XYLOCAINE) 2 % solution Use as directed 15 mLs in the mouth or throat as needed for mouth pain. 12/03/22   Mickie Bail, NP  lisinopril (PRINIVIL,ZESTRIL) 10 MG tablet TAKE 1 TABLET(10 MG) BY MOUTH DAILY 04/25/18   Aliene Beams, MD  losartan (COZAAR) 25 MG tablet Take 25 mg by mouth daily.    [provider]  metFORMIN (GLUCOPHAGE) 1000 MG tablet Take 1 tablet (1,000 mg total) by mouth 2 (two) times daily. 04/25/18    Aliene Beams, MD  metoprolol succinate (TOPROL-XL) 25 MG 24 hr tablet Take 25 mg by mouth daily. 05/19/20   [provider]  montelukast (SINGULAIR) 10 MG tablet Take 1 tablet (10 mg total) by mouth at bedtime. 04/25/18   Aliene Beams, MD  OZEMPIC, 0.25 OR 0.5 MG/DOSE, 2 MG/3ML SOPN SMARTSIG:0.25 Milligram(s) SUB-Q Once a Week 11/02/22   [provider]  predniSONE (DELTASONE) 10 MG tablet Take 1 tablet (10 mg total) by mouth daily. 6,5,4,3,2,1 six day taper 12/03/22   Evon Slack, PA-C  rosuvastatin (CRESTOR) 10 MG tablet Take 1 tablet (10 mg total) by mouth daily. 04/25/18   Aliene Beams, MD  valACYclovir (VALTREX) 1000 MG tablet TAKE 1 TABLET BY MOUTH TWICE DAILY 07/18/23   Cyril Mourning A, NP  Vitamin D, Ergocalciferol, (DRISDOL) 1.25 MG (50000 UNIT) CAPS capsule Take 50,000 Units by mouth once a week. 11/21/19   [provider]    Family History Family History  Problem Relation Age of Onset   Hypertension Maternal Grandmother    Breast cancer Other    Diabetes Mother    High blood pressure Mother    High Cholesterol Mother    Diabetes Father    High blood pressure Father    High Cholesterol Father    Alcoholism Father    Obesity Father    Hypertension Brother    Hyperlipidemia Brother    Lung cancer Paternal Grandmother    Colon cancer Maternal Grandfather     Social History Social History   Tobacco Use   Smoking status: Never   Smokeless tobacco: Never  Vaping Use   Vaping status: Never Used  Substance Use Topics   Alcohol use: Yes    Comment: occ. 2 drinks/month   Drug use: No     Allergies   Mobic [meloxicam]   Review of Systems Review of Systems  Constitutional:  Negative for chills and fever.  Gastrointestinal:  Positive for abdominal pain. Negative for constipation, diarrhea, nausea and vomiting.  Genitourinary:  Positive for vaginal discharge. Negative for dysuria, flank pain, hematuria and pelvic pain.     Physical  Exam Triage Vital Signs ED Triage Vitals [09/29/23 1659]  Encounter Vitals Group     BP 138/87     Systolic BP Percentile      Diastolic BP Percentile      Pulse Rate 74     Resp 18     Temp 98.7 F (37.1 C)     Temp Source Oral     SpO2 99 %     Weight      Height      Head Circumference      Peak Flow      Pain Score 5  Pain Loc      Pain Education      Exclude from Growth Chart    No data found.  Updated Vital Signs BP 138/87 (BP Location: Right Arm)   Pulse 74   Temp 98.7 F (37.1 C) (Oral)   Resp 18   LMP 02/15/2022 (Approximate)   SpO2 99%   Visual Acuity Right Eye Distance:   Left Eye Distance:   Bilateral Distance:    Right Eye Near:   Left Eye Near:    Bilateral Near:     Physical Exam Constitutional:      General: She is not in acute distress. HENT:     Mouth/Throat:     Mouth: Mucous membranes are moist.  Cardiovascular:     Rate and Rhythm: Normal rate and regular rhythm.  Pulmonary:     Effort: Pulmonary effort is normal. No respiratory distress.  Abdominal:     General: Bowel sounds are normal.     Palpations: Abdomen is soft.     Tenderness: There is no abdominal tenderness. There is no right CVA tenderness, left CVA tenderness, guarding or rebound.  Neurological:     Mental Status: She is alert.      UC Treatments / Results  Labs (all labs ordered are listed, but only abnormal results are displayed) Labs Reviewed  POCT URINALYSIS DIP (MANUAL ENTRY) - Abnormal; Notable for the following components:      Result Value   Color, UA light yellow (*)    Clarity, UA cloudy (*)    All other components within normal limits  CERVICOVAGINAL ANCILLARY ONLY    EKG   Radiology No results found.  Procedures Procedures (including critical care time)  Medications Ordered in UC Medications - No data to display  Initial Impression / Assessment and Plan / UC Course  I have reviewed the triage vital signs and the nursing  notes.  Pertinent labs & imaging results that were available during my care of the patient were reviewed by me and considered in my medical decision making (see chart for details).    Lower abdominal pain, vaginal discharge.  Afebrile and vital signs are stable.  Abdomen is soft and nontender.  Urine does not indicate infection.  Patient obtained vaginal self swab for testing, including STD testing per her request.  Discussed that we will call her if the test results are positive.  Instructed her to follow-up with her PCP tomorrow.  ED precautions given.  Education provided on abdominal pain.  Patient agrees to plan of care.  Final Clinical Impressions(s) / UC Diagnoses   Final diagnoses:  Lower abdominal pain  Vaginal discharge     Discharge Instructions      Your urine is normal.  Your vaginal test are pending.    Follow up with your primary care provider tomorrow.  Go to the emergency department if you have worsening symptoms.        ED Prescriptions   None    PDMP not reviewed this encounter.   Mickie Bail, NP 09/29/23 1734

## 2023-09-29 NOTE — Discharge Instructions (Addendum)
Your urine is normal.  Your vaginal test are pending.    Follow up with your primary care provider tomorrow.  Go to the emergency department if you have worsening symptoms.

## 2023-09-30 LAB — CERVICOVAGINAL ANCILLARY ONLY
Bacterial Vaginitis (gardnerella): NEGATIVE
Candida Glabrata: NEGATIVE
Candida Vaginitis: NEGATIVE
Chlamydia: NEGATIVE
Comment: NEGATIVE
Comment: NEGATIVE
Comment: NEGATIVE
Comment: NEGATIVE
Comment: NEGATIVE
Comment: NORMAL
Neisseria Gonorrhea: NEGATIVE
Trichomonas: NEGATIVE

## 2024-02-01 ENCOUNTER — Inpatient Hospital Stay
Admission: RE | Admit: 2024-02-01 | Discharge: 2024-02-01 | Disposition: A | Discharge status: Home / Self Care | Source: Ambulatory Visit | Attending: Family Medicine | Admitting: Family Medicine

## 2024-02-01 DIAGNOSIS — J029 Acute pharyngitis, unspecified: Secondary | ICD-10-CM | POA: Diagnosis not present

## 2024-02-01 DIAGNOSIS — Z03818 Encounter for observation for suspected exposure to other biological agents ruled out: Secondary | ICD-10-CM | POA: Diagnosis not present

## 2024-02-01 DIAGNOSIS — J069 Acute upper respiratory infection, unspecified: Secondary | ICD-10-CM | POA: Diagnosis not present

## 2024-02-01 DIAGNOSIS — J209 Acute bronchitis, unspecified: Secondary | ICD-10-CM | POA: Diagnosis not present

## 2024-03-06 DIAGNOSIS — M25562 Pain in left knee: Secondary | ICD-10-CM | POA: Diagnosis not present

## 2024-04-06 DIAGNOSIS — G4733 Obstructive sleep apnea (adult) (pediatric): Secondary | ICD-10-CM | POA: Diagnosis not present

## 2024-04-06 DIAGNOSIS — E782 Mixed hyperlipidemia: Secondary | ICD-10-CM | POA: Diagnosis not present

## 2024-04-06 DIAGNOSIS — E1169 Type 2 diabetes mellitus with other specified complication: Secondary | ICD-10-CM | POA: Diagnosis not present

## 2024-04-06 DIAGNOSIS — I10 Essential (primary) hypertension: Secondary | ICD-10-CM | POA: Diagnosis not present

## 2024-07-24 DIAGNOSIS — Z01419 Encounter for gynecological examination (general) (routine) without abnormal findings: Secondary | ICD-10-CM | POA: Diagnosis not present

## 2024-07-24 DIAGNOSIS — Z1151 Encounter for screening for human papillomavirus (HPV): Secondary | ICD-10-CM | POA: Diagnosis not present

## 2024-07-24 DIAGNOSIS — Z124 Encounter for screening for malignant neoplasm of cervix: Secondary | ICD-10-CM | POA: Diagnosis not present

## 2024-09-19 ENCOUNTER — Ambulatory Visit: Admitting: Family Medicine

## 2024-11-07 ENCOUNTER — Ambulatory Visit

## 2025-01-18 ENCOUNTER — Ambulatory Visit: Payer: Self-pay
